# Patient Record
Sex: Male | Born: 1980 | Race: Black or African American | Hispanic: No | Marital: Single | Smoking: Current every day smoker
Health system: Southern US, Community
[De-identification: ages and names within clinical notes are randomized; demographics above are authoritative.]

## PROBLEM LIST (undated history)

## (undated) DIAGNOSIS — Z9109 Other allergy status, other than to drugs and biological substances: Secondary | ICD-10-CM

## (undated) DIAGNOSIS — K089 Disorder of teeth and supporting structures, unspecified: Secondary | ICD-10-CM

## (undated) HISTORY — PX: MOUTH SURGERY: SHX715

---

## 2001-01-30 ENCOUNTER — Encounter: Payer: Self-pay | Admitting: Emergency Medicine

## 2001-01-30 ENCOUNTER — Emergency Department (HOSPITAL_COMMUNITY): Admission: AC | Admit: 2001-01-30 | Discharge: 2001-01-30 | Payer: Self-pay

## 2001-08-20 ENCOUNTER — Emergency Department (HOSPITAL_COMMUNITY): Admission: EM | Admit: 2001-08-20 | Discharge: 2001-08-20 | Payer: Self-pay

## 2004-07-30 ENCOUNTER — Emergency Department (HOSPITAL_COMMUNITY): Admission: EM | Admit: 2004-07-30 | Discharge: 2004-07-30 | Payer: Self-pay | Admitting: Emergency Medicine

## 2005-05-14 ENCOUNTER — Emergency Department: Payer: Self-pay | Admitting: Emergency Medicine

## 2005-05-14 ENCOUNTER — Emergency Department (HOSPITAL_COMMUNITY): Admission: EM | Admit: 2005-05-14 | Discharge: 2005-05-14 | Payer: Self-pay | Admitting: Emergency Medicine

## 2005-05-24 ENCOUNTER — Emergency Department (HOSPITAL_COMMUNITY): Admission: EM | Admit: 2005-05-24 | Discharge: 2005-05-24 | Payer: Self-pay | Admitting: Emergency Medicine

## 2007-02-06 ENCOUNTER — Emergency Department (HOSPITAL_COMMUNITY): Admission: EM | Admit: 2007-02-06 | Discharge: 2007-02-06 | Payer: Self-pay | Admitting: Emergency Medicine

## 2007-02-09 ENCOUNTER — Emergency Department (HOSPITAL_COMMUNITY): Admission: EM | Admit: 2007-02-09 | Discharge: 2007-02-09 | Payer: Self-pay | Admitting: Family Medicine

## 2007-09-03 ENCOUNTER — Inpatient Hospital Stay (HOSPITAL_COMMUNITY): Admission: AC | Admit: 2007-09-03 | Discharge: 2007-09-11 | Payer: Self-pay

## 2007-09-24 ENCOUNTER — Emergency Department (HOSPITAL_COMMUNITY): Admission: EM | Admit: 2007-09-24 | Discharge: 2007-09-24 | Payer: Self-pay | Admitting: Emergency Medicine

## 2008-06-03 ENCOUNTER — Emergency Department (HOSPITAL_COMMUNITY): Admission: EM | Admit: 2008-06-03 | Discharge: 2008-06-03 | Payer: Self-pay | Admitting: Emergency Medicine
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
## 2008-09-15 ENCOUNTER — Emergency Department (HOSPITAL_COMMUNITY): Admission: EM | Admit: 2008-09-15 | Discharge: 2008-09-15 | Payer: Self-pay | Admitting: Emergency Medicine

## 2008-10-17 ENCOUNTER — Emergency Department (HOSPITAL_COMMUNITY): Admission: EM | Admit: 2008-10-17 | Discharge: 2008-10-17 | Payer: Self-pay | Admitting: Emergency Medicine

## 2008-10-27 ENCOUNTER — Emergency Department (HOSPITAL_COMMUNITY): Admission: EM | Admit: 2008-10-27 | Discharge: 2008-10-27 | Payer: Self-pay | Admitting: Emergency Medicine

## 2008-12-24 ENCOUNTER — Emergency Department (HOSPITAL_COMMUNITY): Admission: EM | Admit: 2008-12-24 | Discharge: 2008-12-24 | Payer: Self-pay | Admitting: Emergency Medicine

## 2009-01-28 ENCOUNTER — Emergency Department (HOSPITAL_COMMUNITY): Admission: EM | Admit: 2009-01-28 | Discharge: 2009-01-28 | Payer: Self-pay | Admitting: Emergency Medicine

## 2009-04-10 ENCOUNTER — Emergency Department (HOSPITAL_COMMUNITY): Admission: EM | Admit: 2009-04-10 | Discharge: 2009-04-10 | Payer: Self-pay | Admitting: Emergency Medicine

## 2009-07-15 ENCOUNTER — Emergency Department (HOSPITAL_COMMUNITY): Admission: EM | Admit: 2009-07-15 | Discharge: 2009-07-15 | Payer: Self-pay | Admitting: Emergency Medicine

## 2009-08-01 ENCOUNTER — Emergency Department (HOSPITAL_COMMUNITY): Admission: EM | Admit: 2009-08-01 | Discharge: 2009-08-01 | Payer: Self-pay | Admitting: Emergency Medicine

## 2009-09-19 ENCOUNTER — Emergency Department (HOSPITAL_COMMUNITY): Admission: EM | Admit: 2009-09-19 | Discharge: 2009-09-19 | Payer: Self-pay | Admitting: Emergency Medicine

## 2009-10-09 ENCOUNTER — Emergency Department (HOSPITAL_COMMUNITY): Admission: EM | Admit: 2009-10-09 | Discharge: 2009-10-09 | Payer: Self-pay | Admitting: Emergency Medicine

## 2009-11-18 ENCOUNTER — Emergency Department (HOSPITAL_COMMUNITY): Admission: EM | Admit: 2009-11-18 | Discharge: 2009-11-18 | Payer: Self-pay | Admitting: Emergency Medicine

## 2010-03-02 ENCOUNTER — Emergency Department (HOSPITAL_COMMUNITY): Admission: EM | Admit: 2010-03-02 | Discharge: 2010-03-02 | Payer: Self-pay | Admitting: Emergency Medicine

## 2010-05-07 ENCOUNTER — Emergency Department (HOSPITAL_COMMUNITY): Admission: EM | Admit: 2010-05-07 | Discharge: 2010-05-07 | Payer: Self-pay | Admitting: Emergency Medicine

## 2010-05-21 ENCOUNTER — Emergency Department (HOSPITAL_COMMUNITY): Admission: EM | Admit: 2010-05-21 | Discharge: 2010-05-21 | Payer: Self-pay | Admitting: Emergency Medicine

## 2010-07-21 ENCOUNTER — Emergency Department (HOSPITAL_COMMUNITY): Admission: EM | Admit: 2010-07-21 | Discharge: 2010-07-21 | Payer: Self-pay | Admitting: Emergency Medicine

## 2010-11-29 ENCOUNTER — Emergency Department (HOSPITAL_COMMUNITY)
Admission: EM | Admit: 2010-11-29 | Discharge: 2010-11-29 | Disposition: A | Discharge status: Home / Self Care | Payer: Self-pay | Attending: Emergency Medicine | Admitting: Emergency Medicine

## 2010-11-29 DIAGNOSIS — K029 Dental caries, unspecified: Secondary | ICD-10-CM | POA: Insufficient documentation

## 2010-11-29 DIAGNOSIS — K089 Disorder of teeth and supporting structures, unspecified: Secondary | ICD-10-CM | POA: Insufficient documentation

## 2010-12-25 LAB — GC/CHLAMYDIA PROBE AMP, GENITAL: GC Probe Amp, Genital: NEGATIVE
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
## 2011-03-24 ENCOUNTER — Emergency Department (HOSPITAL_COMMUNITY)
Admission: EM | Admit: 2011-03-24 | Discharge: 2011-03-24 | Disposition: A | Discharge status: Home / Self Care | Payer: Self-pay | Attending: Emergency Medicine | Admitting: Emergency Medicine

## 2011-03-24 DIAGNOSIS — K089 Disorder of teeth and supporting structures, unspecified: Secondary | ICD-10-CM | POA: Insufficient documentation

## 2011-06-20 ENCOUNTER — Emergency Department (HOSPITAL_COMMUNITY)
Admission: EM | Admit: 2011-06-20 | Discharge: 2011-06-20 | Disposition: A | Discharge status: Home / Self Care | Payer: Self-pay | Attending: Emergency Medicine | Admitting: Emergency Medicine

## 2011-06-20 DIAGNOSIS — L02419 Cutaneous abscess of limb, unspecified: Secondary | ICD-10-CM | POA: Insufficient documentation

## 2011-06-20 DIAGNOSIS — M79609 Pain in unspecified limb: Secondary | ICD-10-CM | POA: Insufficient documentation

## 2011-07-20 LAB — TYPE AND SCREEN: Antibody Screen: NEGATIVE

## 2011-07-20 LAB — CBC
HCT: 40.1
MCHC: 33
MCV: 89.8
Platelets: 260
Platelets: 274
RBC: 4.4
RBC: 4.48
WBC: 10.9 — ABNORMAL HIGH
WBC: 7.7

## 2011-07-20 LAB — I-STAT 8, (EC8 V) (CONVERTED LAB)
BUN: 11
HCT: 47
Potassium: 2.7 — CL
Sodium: 142
pCO2, Ven: 48.7

## 2011-07-20 LAB — BASIC METABOLIC PANEL
Calcium: 8 — ABNORMAL LOW
Chloride: 106
Creatinine, Ser: 0.99
GFR calc Af Amer: >60
GFR calc non Af Amer: >60

## 2011-07-20 LAB — ABO/RH: ABO/RH(D): O POS

## 2011-07-20 LAB — PROTIME-INR: INR: 1

## 2011-08-05 ENCOUNTER — Emergency Department (HOSPITAL_COMMUNITY)
Admission: EM | Admit: 2011-08-05 | Discharge: 2011-08-05 | Disposition: A | Discharge status: Home / Self Care | Payer: Self-pay | Attending: Emergency Medicine | Admitting: Emergency Medicine

## 2011-08-05 DIAGNOSIS — K089 Disorder of teeth and supporting structures, unspecified: Secondary | ICD-10-CM | POA: Insufficient documentation

## 2011-08-24 ENCOUNTER — Emergency Department (INDEPENDENT_AMBULATORY_CARE_PROVIDER_SITE_OTHER)
Admission: EM | Admit: 2011-08-24 | Discharge: 2011-08-24 | Disposition: A | Discharge status: Home / Self Care | Payer: Self-pay | Source: Home / Self Care | Attending: Emergency Medicine | Admitting: Emergency Medicine

## 2011-08-24 ENCOUNTER — Encounter: Payer: Self-pay | Admitting: Cardiology

## 2011-08-24 DIAGNOSIS — Z202 Contact with and (suspected) exposure to infections with a predominantly sexual mode of transmission: Secondary | ICD-10-CM

## 2011-08-24 NOTE — ED Provider Notes (Addendum)
History     CSN: [id number] Arrival date & time: 08/24/2011  1:18 PM   First MD Initiated Contact with Patient 08/24/11 1220      Chief Complaint  Patient presents with  . Penile Discharge    (Consider location/radiation/quality/duration/timing/severity/associated sxs/prior treatment) HPI Comments: I want to be tested about a week ago,im not sure but think saw some white discharge, have none now, no pain , no discharge and no burning on urination  Patient is a 30 y.o. male presenting with penile discharge. The history is provided by the patient.  Penile Discharge He has tried nothing for the symptoms.    History reviewed. No pertinent past medical history.  History reviewed. No pertinent past surgical history.  No family history on file.  History  Substance Use Topics  . Smoking status: Current Everyday Smoker -- 1.0 packs/day    Types: Cigarettes  . Smokeless tobacco: Not on file  . Alcohol Use: Yes     occas      Review of Systems  Constitutional: Negative.  Negative for fever.  Genitourinary: Negative for dysuria, discharge and penile pain.  Skin: Negative for rash.    Allergies  Review of patient's allergies indicates no known allergies.  Home Medications  No current outpatient prescriptions on file.  BP 138/85  Pulse 66  Temp(Src) 98.1 F (36.7 C) (Oral)  Resp 16  SpO2 100%  Physical Exam  Constitutional: He appears well-developed and well-nourished.  Abdominal: Soft.  Genitourinary: Penis normal. No penile erythema or penile tenderness. No discharge found.  Skin: No erythema.    ED Course  Procedures (including critical care time)   Labs Reviewed  GC/CHLAMYDIA PROBE AMP, GENITAL   No results found.   1. Sexually transmitted disease exposure       MDM  Worried well, non specific exposure but wanted to be screened        Jimmie Molly, MD 08/24/11 1857  Jimmie Molly, MD 08/24/11 1857  Jimmie Molly, MD 08/24/11 7829

## 2011-08-24 NOTE — ED Notes (Signed)
Pt reports clear discharge from penis for the past week. Denies burning/pain on urination. Denies exposure to STD.

## 2011-08-25 LAB — GC/CHLAMYDIA PROBE AMP, GENITAL
Chlamydia, DNA Probe: NEGATIVE
GC Probe Amp, Genital: NEGATIVE

## 2011-08-26 ENCOUNTER — Telehealth (HOSPITAL_COMMUNITY): Payer: Self-pay | Admitting: *Deleted

## 2011-11-24 ENCOUNTER — Encounter (HOSPITAL_COMMUNITY): Payer: Self-pay | Admitting: Emergency Medicine

## 2011-11-24 ENCOUNTER — Emergency Department (HOSPITAL_COMMUNITY)
Admission: EM | Admit: 2011-11-24 | Discharge: 2011-11-24 | Disposition: A | Discharge status: Home / Self Care | Payer: Self-pay | Attending: Emergency Medicine | Admitting: Emergency Medicine

## 2011-11-24 DIAGNOSIS — K089 Disorder of teeth and supporting structures, unspecified: Secondary | ICD-10-CM | POA: Insufficient documentation

## 2011-11-24 DIAGNOSIS — K0889 Other specified disorders of teeth and supporting structures: Secondary | ICD-10-CM

## 2011-11-24 DIAGNOSIS — R220 Localized swelling, mass and lump, head: Secondary | ICD-10-CM | POA: Insufficient documentation

## 2011-11-24 DIAGNOSIS — R6884 Jaw pain: Secondary | ICD-10-CM | POA: Insufficient documentation

## 2011-11-24 DIAGNOSIS — R221 Localized swelling, mass and lump, neck: Secondary | ICD-10-CM | POA: Insufficient documentation

## 2011-11-24 MED ORDER — HYDROCODONE-ACETAMINOPHEN 5-325 MG PO TABS
1.0000 | ORAL_TABLET | Freq: Once | ORAL | Status: DC
  Filled 2011-11-24: qty 1

## 2011-11-24 MED ORDER — HYDROCODONE-ACETAMINOPHEN 5-325 MG PO TABS: 1.0000 | ORAL_TABLET | Freq: Four times a day (QID) | ORAL | Status: AC | PRN

## 2011-11-24 MED ORDER — NAPROXEN 500 MG PO TABS: 500.0000 mg | ORAL_TABLET | Freq: Two times a day (BID) | ORAL | Status: DC

## 2011-11-24 MED ORDER — PENICILLIN V POTASSIUM 500 MG PO TABS: 500.0000 mg | ORAL_TABLET | Freq: Four times a day (QID) | ORAL | Status: AC

## 2011-11-24 NOTE — ED Notes (Signed)
Pt received to [facility] with toothache x 1 week, denies any fever., no facial swelling noted.

## 2011-11-24 NOTE — ED Notes (Signed)
Patient states he has pain in lower left jaw and has a pus pocket in it several days ago.  No infections noted at site. C/O pain that he has been taking Tylenol for with no relief.  [facility] is to [facility] him [facility] appointment.

## 2011-11-24 NOTE — Discharge Instructions (Signed)
Dental Pain Toothache is pain in or around a tooth. It may get worse with chewing or with cold or heat.  HOME CARE  Your dentist may use a numbing medicine during treatment. If so, you may need to avoid eating until the medicine wears off. Ask your dentist about this.   Only take medicine as told by your dentist or doctor.   Avoid chewing food near the painful tooth until after all treatment is done. Ask your dentist about this.  GET HELP RIGHT AWAY IF:   The problem gets worse or new problems appear.   You have a fever.   There is redness and puffiness (swelling) of the face, jaw, or neck.   You cannot open your mouth.   There is pain in the jaw.   There is very bad pain that is not helped by medicine.  MAKE SURE YOU:   Understand these instructions.   Will watch your condition.   Will get help right away if you are not doing well or get worse.  Document Released: 03/15/2008 Document Revised: 06/09/2011 Document Reviewed: 03/15/2008 [facility] Patient Information 2012 [place], [place].  RESOURCE GUIDE  Dental Problems  Patients with Medicaid: [facility] [phone] [address]                                           [phone] [address] Phone:  [phone]                                                  Phone:  [phone]  If unable to pay or uninsured, contact:  [facility] or [facility]. to become qualified for the adult dental clinic.  Chronic Pain Problems Contact [facility]  [phone] Patients need to be referred by their primary care doctor.  Insufficient Money for Medicine Contact United Way:  call "211" or [facility] [phone].  No Primary Care Doctor Call [facility]  [phone] Other agencies that provide inexpensive medical care    [facility]  [phone]    [facility]  [phone]    [facility]  [phone]    [facility]  [phone]    [facility]  [phone]    [facility]  [phone]  Psychological Services [facility] Behavioral Health  [phone] [facility] Services  [phone] [facility]   [phone] (emergency services [phone])  Substance Abuse Resources Alcohol and Drug Services  [phone] [facility] [phone] [facility] [place] [phone] Floydene Flock [phone] Residential & Outpatient Substance Abuse Program  ([phone]  Abuse/Neglect [facility] Child Abuse Hotline 609-184-5630 [facility] Child Abuse Hotline 936-028-6252 (After Hours)  Emergency Shelter [facility] Ministries (734) [phone]  Maternity Homes Room at the [place] [facility] [place] [phone] [facility] [phone]  MRSA Hotline #:   971-555-0236    [facility]  [address] [place] [address] [place]                       [address]      [address]  Blondell Reveal Phone:  [phone]                                   Phone:  [phone]                 Phone:  [phone]  [facility] Mental Health Phone:  [phone]  [facility] Child Abuse Hotline [phone])[phone] (After Hours)   Take antibiotic as directed take anti-inflammatory and pain medicine as needed. Followup with dentist as soon as possible resource guide provide.

## 2011-11-24 NOTE — ED Notes (Signed)
Left sided dental pain x 1 week had a bump in mouth and it busrt

## 2011-11-24 NOTE — ED Provider Notes (Signed)
History     CSN: [id number]  Arrival date & time 11/24/11  1326   First MD Initiated Contact with Patient 11/24/11 1432      Chief Complaint  Patient presents with  . Dental Pain    (Consider location/radiation/quality/duration/timing/severity/associated sxs/prior treatment) Patient is a 31 y.o. male presenting with tooth pain. The history is provided by the patient.  Dental PainThe primary symptoms include mouth pain and oral bleeding. Primary symptoms do not include dental injury, oral lesions, headaches, fever, shortness of breath, sore throat, angioedema or cough. The symptoms began more than 1 week ago. The symptoms are worsening. The symptoms occur constantly.  Additional symptoms include: gum swelling, gum tenderness, jaw pain and facial swelling. Additional symptoms do not include: trouble swallowing and drooling.    History reviewed. No pertinent past medical history.  History reviewed. No pertinent past surgical history.  No family history on file.  History  Substance Use Topics  . Smoking status: Current Everyday Smoker -- 1.0 packs/day    Types: Cigarettes  . Smokeless tobacco: Not on file  . Alcohol Use: Yes     occas      Review of Systems  Constitutional: Negative for fever.  HENT: Positive for facial swelling. Negative for sore throat, drooling and trouble swallowing.   Respiratory: Negative for cough and shortness of breath.   Neurological: Negative for headaches.    Allergies  Review of patient's allergies indicates no known allergies.  Home Medications   Current Outpatient Rx  Name Route Sig Dispense Refill  . ACETAMINOPHEN 500 MG PO TABS Oral Take 1,000 mg by mouth every 6 (six) hours as needed. As needed for pain.    Marland Kitchen HYDROCODONE-ACETAMINOPHEN 5-325 MG PO TABS Oral Take 1-2 tablets by mouth every 6 (six) hours as needed for pain. 10 tablet 0  . NAPROXEN 500 MG PO TABS Oral Take 1 tablet (500 mg total) by mouth 2 (two) times daily. 14 tablet 0   . PENICILLIN V POTASSIUM 500 MG PO TABS Oral Take 1 tablet (500 mg total) by mouth 4 (four) times daily. 40 tablet 0    BP 107/72  Pulse 79  Temp 98.2 F (36.8 C)  Resp 16  SpO2 99%  Physical Exam  Nursing note and vitals reviewed. Constitutional: He is oriented to person, place, and time. He appears well-developed and well-nourished. No distress.  HENT:  Head: Normocephalic and atraumatic.  Mouth/Throat: Oropharynx is clear and moist.       Missing several teeth no direct evidence of gum swelling or facial swelling no evidence of gum infection. Tender teeth that are remaining both sides lower jaw  Eyes: Pupils are equal, round, and reactive to light.  Neck: Normal range of motion. Neck supple.  Cardiovascular: Normal rate, regular rhythm and normal heart sounds.   No murmur heard. Pulmonary/Chest: Effort normal and breath sounds normal.  Abdominal: Soft. Bowel sounds are normal.  Musculoskeletal: Normal range of motion.  Lymphadenopathy:    He has no cervical adenopathy.  Neurological: He is alert and oriented to person, place, and time. No cranial nerve deficit. He exhibits normal muscle tone. Coordination normal.  Skin: Skin is warm. No rash noted.    ED Course  Procedures (including critical care time)  Labs Reviewed - No data to display No results found.   1. Toothache       MDM  Patient already missing numerous teeth complaint of tooth pain along both sides of the lower jaw no evidence of  significant gum infection swelling but suspect due to significant decay probably toothache related.    Resource guide provided for patient to find dental followup.        Shelda Jakes, MD 11/24/11 [phone]

## 2012-03-28 ENCOUNTER — Encounter (HOSPITAL_COMMUNITY): Payer: Self-pay

## 2012-03-28 ENCOUNTER — Emergency Department (HOSPITAL_COMMUNITY)
Admission: EM | Admit: 2012-03-28 | Discharge: 2012-03-28 | Disposition: A | Discharge status: Home / Self Care | Payer: Self-pay | Attending: Emergency Medicine | Admitting: Emergency Medicine

## 2012-03-28 DIAGNOSIS — B353 Tinea pedis: Secondary | ICD-10-CM | POA: Insufficient documentation

## 2012-03-28 DIAGNOSIS — K0889 Other specified disorders of teeth and supporting structures: Secondary | ICD-10-CM

## 2012-03-28 DIAGNOSIS — K029 Dental caries, unspecified: Secondary | ICD-10-CM | POA: Insufficient documentation

## 2012-03-28 DIAGNOSIS — L02619 Cutaneous abscess of unspecified foot: Secondary | ICD-10-CM | POA: Insufficient documentation

## 2012-03-28 DIAGNOSIS — L03039 Cellulitis of unspecified toe: Secondary | ICD-10-CM | POA: Insufficient documentation

## 2012-03-28 MED ORDER — IBUPROFEN 800 MG PO TABS: 800.0000 mg | ORAL_TABLET | Freq: Three times a day (TID) | ORAL | Status: AC | PRN

## 2012-03-28 MED ORDER — HYDROCODONE-ACETAMINOPHEN 5-325 MG PO TABS: ORAL_TABLET | ORAL | Status: AC

## 2012-03-28 MED ORDER — CEPHALEXIN 500 MG PO CAPS: 500.0000 mg | ORAL_CAPSULE | Freq: Four times a day (QID) | ORAL | Status: AC

## 2012-03-28 NOTE — ED Notes (Signed)
Pt presented to the ER with complaint of pain and swelling to the 5th toe of the lt foot onset Sunday night and toothache x 1 week.

## 2012-03-28 NOTE — Discharge Instructions (Signed)
Please read and follow all provided instructions.  Your diagnoses today include:  1. Cellulitis of toe   2. Pain, dental   3. Tinea pedis     Tests performed today include:  Vital signs. See below for your results today.   Medications prescribed:   Keflex - antibiotic that kills skin bacteria  You have been prescribed an antibiotic medicine: take the entire course of medicine even if you are feeling better. Stopping early can cause the antibiotic not to work.   Vicodin (hydrocodone/acetaminophen) - narcotic pain medicine  You have been prescribed narcotic pain medication such as Vicodin or Percocet: DO NOT drive or perform any activities that require you to be awake and alert because this medicine can make you drowsy. BE VERY CAREFUL not to take multiple medicines containing Tylenol (also called acetaminophen). Doing so can lead to an overdose which can damage your liver and cause liver failure and possibly death.     Ibuprofen - anti-inflammatory pain medication  Do not exceed 800mg  ibuprofen every 8 hours  You have been prescribed an anti-inflammatory medication or NSAID. You can also find these medications over the counter. Take with food. Take smallest effective dose for the shortest duration needed for your pain. Stop taking if you experience stomach pain or vomiting.   Take any prescribed medications only as directed.   Home care instructions:  Follow any educational materials contained in this packet. Keep affected area above the level of your heart when possible. Wash area gently twice a day with warm soapy water. Do not apply alcohol or hydrogen peroxide. Cover the area if it draining or weeping.   Buy over-the-counter medication such as Lamisil to treat your athlete's foot and prevent further infections.   Follow-up instructions: Return to the Emergency Department in 48 hours for a recheck if your symptoms are not significantly improved.   Please follow-up with your  primary care provider in the next 1 week for further evaluation of your symptoms. If you do not have a primary care doctor -- see below for referral information.   Return instructions:  Return to the Emergency Department if you have:  Fever  Worsening symptoms  Worsening pain  Worsening swelling  Neck swelling or trouble breathing  Redness of the skin that moves away from the affected area, especially if it streaks away from the affected area   Any other emergent concerns  Your vital signs today were: BP 118/75  Pulse 67  Temp 97.6 F (36.4 C) (Oral)  Resp 16  Ht 5\' 11"  (1.803 m)  Wt 179 lb (81.194 kg)  BMI 24.97 kg/m2  SpO2 100% If your blood pressure (BP) was elevated above 135/85 this visit, please have this repeated by your doctor within one month. -------------- No Primary Care Doctor Call [facility]  [phone] Other agencies that provide inexpensive medical care    [facility]  [phone]    [facility] Internal Medicine  [phone]    [facility]  [phone]    [facility]  [phone]    [facility]  [phone]    [facility]  [phone] -------------- RESOURCE GUIDE:  Dental Problems  Patients with Medicaid: [facility] [phone] [address]  [address] Phone:  [phone]                                                   Phone:  [phone]  If unable to pay or uninsured, contact:  [facility] or [facility]. to become qualified for the adult dental clinic.  Chronic Pain Problems Contact [facility]  [phone] Patients need to be referred by their primary care doctor.  Insufficient Money for Medicine Contact United Way:  call "211" or [facility] [phone].  Psychological Services [facility] Behavioral Health  [phone] [facility]  [phone] [facility] Mental Health    [phone] (emergency services [phone])  Substance Abuse Resources Alcohol and Drug Services  [phone] [facility] [phone] [facility] [place] [phone] Floydene Flock 3163171097 Residential & Outpatient Substance Abuse Program  [phone]  Abuse/Neglect [facility] Child Abuse Hotline (629)165-6795 [facility] Child Abuse Hotline (215) 230-4283 (After Hours)  Emergency Shelter [facility] Ministries [phone])497-2761  Maternity Homes Room at the [place] [facility] [place] [phone] [place] [facility] [phone]  [facility] Resources  [facility] [place]     United Way                          [facility] Dept. [address] [place]                       [address]      [address]  Blondell Reveal Phone:  [phone]                                   Phone:  [phone]                 Phone:  [phone]  [facility] Mental Health Phone:  [phone]  [facility] Child Abuse Hotline 214-335-8756 [phone] (After Hours)

## 2012-03-28 NOTE — ED Notes (Addendum)
Patient states Sunday night his 5th toe on left foot. States the pain is a pressure when walking. 5th toe appears red and swollen and warm to touch. Patient state she has tooth ache (lower left gum/teeth).

## 2012-03-28 NOTE — ED Provider Notes (Signed)
History     CSN: [id number]  Arrival date & time 03/28/12  4098   First MD Initiated Contact with Patient 03/28/12 0940      Chief Complaint  Patient presents with  . Foot Swelling  . Dental Pain    (Consider location/radiation/quality/duration/timing/severity/associated sxs/prior treatment) HPI Comments: Patient presents with a complaint of left fifth toe pain, redness, and swelling for the past 2 days. Patient denies injury. He does have history of athlete's foot. No fevers, nausea or vomiting. He is taken Tylenol for the pain however this is only provided mild relief. Patient also complains of left and right lower jaw pain for the past week. Pain is intermittent. He has not had any facial swelling. Cold air makes the symptoms worse. Nothing makes the symptoms better. He has not had any neck swelling or shortness of breath. No history of diabetes.  Patient is a 31 y.o. male presenting with tooth pain and toe pain. The history is provided by the patient.  Dental PainThe primary symptoms include mouth pain and headaches. Primary symptoms do not include dental injury, fever, shortness of breath or sore throat. The symptoms began 5 to 7 days ago. The symptoms are unchanged. The symptoms are new. The symptoms occur intermittently.  The headache is not associated with weakness.   Additional symptoms do not include: jaw pain, facial swelling, trouble swallowing and ear pain.   Toe Pain This is a new problem. The current episode started in the past 7 days. The problem occurs constantly. The problem has been gradually worsening. Associated symptoms include headaches. Pertinent negatives include no arthralgias, fever, joint swelling, nausea, neck pain, numbness, sore throat, vomiting or weakness. The symptoms are aggravated by bending and walking. He has tried acetaminophen for the symptoms. The treatment provided mild relief.    History reviewed. No pertinent past medical history.  History  reviewed. No pertinent past surgical history.  No family history on file.  History  Substance Use Topics  . Smoking status: Current Everyday Smoker -- 1.0 packs/day    Types: Cigarettes  . Smokeless tobacco: Not on file  . Alcohol Use: Yes     occas      Review of Systems  Constitutional: Negative for fever and activity change.  HENT: Positive for dental problem. Negative for ear pain, sore throat, facial swelling, trouble swallowing and neck pain.   Respiratory: Negative for shortness of breath and stridor.   Gastrointestinal: Negative for nausea and vomiting.  Musculoskeletal: Negative for joint swelling, arthralgias and gait problem.  Skin: Positive for color change. Negative for wound.  Neurological: Positive for headaches. Negative for weakness and numbness.    Allergies  Review of patient's allergies indicates no known allergies.  Home Medications   Current Outpatient Rx  Name Route Sig Dispense Refill  . ACETAMINOPHEN 325 MG PO TABS Oral Take 650 mg by mouth every 6 (six) hours as needed. For pain      BP 118/75  Pulse 67  Temp 97.6 F (36.4 C) (Oral)  Resp 16  Ht 5\' 11"  (1.803 m)  Wt 179 lb (81.194 kg)  BMI 24.97 kg/m2  SpO2 100%  Physical Exam  Nursing note and vitals reviewed. Constitutional: He appears well-developed and well-nourished.  HENT:  Head: Normocephalic and atraumatic. No trismus in the jaw.  Right Ear: Tympanic membrane, external ear and ear canal normal.  Left Ear: Tympanic membrane, external ear and ear canal normal.  Nose: Nose normal.  Mouth/Throat: Uvula is midline, oropharynx is  clear and moist and mucous membranes are normal. Abnormal dentition. Dental caries present. No dental abscesses or uvula swelling. No tonsillar abscesses.       Patient with bilateral mandibular tooth pain and tenderness to palpation in area of incisiors. No gross abscess. Gingivitis noted.   Eyes: Conjunctivae are normal. Pupils are equal, round, and  reactive to light.  Neck: Normal range of motion. Neck supple.       No neck swelling or Lugwig's angina  Cardiovascular: Normal pulses.   Musculoskeletal: He exhibits tenderness. He exhibits no edema.       L 5th toe redness proximal to DIP to MTP. There is no paronychia or fluctuance. There is fungal infection between toes with localized skin breakdown.   Neurological: He is alert. No sensory deficit.       Motor, sensation, and vascular distal to the injury is fully intact.   Skin: Skin is warm and dry.  Psychiatric: He has a normal mood and affect.    ED Course  Procedures (including critical care time)  Labs Reviewed - No data to display No results found.   1. Cellulitis of toe   2. Pain, dental   3. Tinea pedis     10:03 AM Patient seen and examined. Will treat for cellulitis of toe. No evidence of paronychia.  Vital signs reviewed and are as follows: Filed Vitals:   03/28/12 0849  BP: 118/75  Pulse: 67  Temp: 97.6 F (36.4 C)  Resp: 16   10:04 AM Pt urged to return with worsening pain, worsening swelling, expanding area of redness or streaking up extremity, fever, or any other concerns. Urged to take complete course of antibiotics as prescribed. Counseled to take pain medications as prescribed. Pt verbalizes understanding and agrees with plan.  10:04 AM Patient counseled to take prescribed medications as directed, return with worsening facial or neck swelling, and to follow-up with his dentist as soon as possible.   10:04 AM Patient counseled on use of narcotic pain medications. Counseled not to combine these medications with others containing tylenol. Urged not to drink alcohol, drive, or perform any other activities that requires focus while taking these medications. The patient verbalizes understanding and agrees with the plan.  MDM  L 5th toe cellulitis, no paronychia or felon. Likely 2/2 skin breakdown due to athlete's foot.   Patient with toothache.  No gross  abscess.  Exam unconcerning for Ludwig's angina or other deep tissue infection in neck.  Will treat with abx and pain medicine.  Urged patient to follow-up with dentist.           Renne Crigler, PA 03/28/12 1910

## 2012-03-29 NOTE — ED Provider Notes (Signed)
Medical screening examination/treatment/procedure(s) were performed by non-physician practitioner and as supervising physician I was immediately available for consultation/collaboration.  Cheri Guppy, MD 03/29/12 1537

## 2012-07-26 ENCOUNTER — Emergency Department (HOSPITAL_COMMUNITY)
Admission: EM | Admit: 2012-07-26 | Discharge: 2012-07-26 | Disposition: A | Discharge status: Home / Self Care | Payer: Self-pay | Attending: Emergency Medicine | Admitting: Emergency Medicine

## 2012-07-26 ENCOUNTER — Encounter (HOSPITAL_COMMUNITY): Payer: Self-pay | Admitting: *Deleted

## 2012-07-26 DIAGNOSIS — F17200 Nicotine dependence, unspecified, uncomplicated: Secondary | ICD-10-CM | POA: Insufficient documentation

## 2012-07-26 DIAGNOSIS — K0889 Other specified disorders of teeth and supporting structures: Secondary | ICD-10-CM

## 2012-07-26 DIAGNOSIS — K029 Dental caries, unspecified: Secondary | ICD-10-CM | POA: Insufficient documentation

## 2012-07-26 MED ORDER — IBUPROFEN 800 MG PO TABS: 800.0000 mg | ORAL_TABLET | Freq: Three times a day (TID) | ORAL | Status: DC

## 2012-07-26 NOTE — ED Provider Notes (Signed)
History     CSN: [id number]  Arrival date & time 07/26/12  0137   First MD Initiated Contact with Patient 07/26/12 0143      Chief Complaint  Patient presents with  . Dental Pain    (Consider location/radiation/quality/duration/timing/severity/associated sxs/prior treatment) HPI Comments: 31 year old male presents to the emergency department complaining of right-sided lower dental pain has been present for "a while" worsening over the past 4 days. States the pain is constant, rated 10 out of 10 unrelieved by Tylenol. Pain radiates up his jaw and is giving him a headache. Admits to dental problems and has no upper teeth. He says when he brushes his teeth his come in the area of pain begins to bleed. He thinks he may have a fever at night only. Denies difficulty swallowing, states it is painful to chew on that side. He does not have a dentist.  The history is provided by the patient.    History reviewed. No pertinent past medical history.  History reviewed. No pertinent past surgical history.  History reviewed. No pertinent family history.  History  Substance Use Topics  . Smoking status: Current Every Day Smoker -- 1.0 packs/day    Types: Cigarettes  . Smokeless tobacco: Not on file  . Alcohol Use: Yes     occas      Review of Systems  Constitutional: Positive for fever. Negative for chills.  HENT: Positive for facial swelling and dental problem. Negative for sore throat, mouth sores, trouble swallowing and voice change.   Gastrointestinal: Negative for nausea and vomiting.  Skin: Negative for rash.  Neurological: Positive for headaches.    Allergies  Review of patient's allergies indicates no known allergies.  Home Medications   Current Outpatient Rx  Name Route Sig Dispense Refill  . ACETAMINOPHEN 325 MG PO TABS Oral Take 650 mg by mouth every 6 (six) hours as needed. For pain      BP 139/71  Temp 98.3 F (36.8 C) (Oral)  Resp 18  SpO2 98%  Physical Exam   Constitutional: He is oriented to person, place, and time. He appears well-developed and well-nourished. No distress.  HENT:  Head: Normocephalic and atraumatic.  Mouth/Throat: Mucous membranes are normal. Abnormal dentition. Dental caries present.       Edentulous upper mouth. Multiple missing lower teeth. Present teeth all with dental caries. No edema, erythema, lesions, pus, dental abscess. Tenderness to palpation of right lower gums where 1st molar should be. No bleeding.  Eyes: Conjunctivae normal and EOM are normal.  Neck: Normal range of motion. Neck supple.  Cardiovascular: Normal rate, regular rhythm and normal heart sounds.   Pulmonary/Chest: Effort normal and breath sounds normal.  Musculoskeletal: Normal range of motion.  Lymphadenopathy:       Head (right side): No submental and no submandibular adenopathy present.       Head (left side): No submental and no submandibular adenopathy present.  Neurological: He is alert and oriented to person, place, and time.  Skin: Skin is warm and dry. No rash noted. He is not diaphoretic. No erythema.  Psychiatric: His speech is normal and behavior is normal. His affect is blunt.    ED Course  Procedures (including critical care time)  Labs Reviewed - No data to display No results found.   1. Pain, dental       MDM  31 year old male with dental pain without dental infection. I will give him ibuprofen and discussed followup with dentist within the next 48 hours.  Trevor Mace, PA-C 07/26/12 0222

## 2012-07-26 NOTE — ED Provider Notes (Signed)
Medical screening examination/treatment/procedure(s) were performed by non-physician practitioner and as supervising physician I was immediately available for consultation/collaboration.  Rikayla Demmon M Maebell Lyvers, MD 07/26/12 0513 

## 2012-07-26 NOTE — ED Notes (Signed)
Pt states that for the past 4 days his right lower mouth has been swollen and painful. Pt missing tooth in area that he says bleeds in the morning when he wakes up.

## 2013-02-06 ENCOUNTER — Encounter (HOSPITAL_COMMUNITY): Payer: Self-pay | Admitting: Emergency Medicine

## 2013-02-06 ENCOUNTER — Emergency Department (HOSPITAL_COMMUNITY)
Admission: EM | Admit: 2013-02-06 | Discharge: 2013-02-06 | Disposition: A | Discharge status: Home / Self Care | Payer: Self-pay | Attending: Emergency Medicine | Admitting: Emergency Medicine

## 2013-02-06 DIAGNOSIS — F17200 Nicotine dependence, unspecified, uncomplicated: Secondary | ICD-10-CM | POA: Insufficient documentation

## 2013-02-06 DIAGNOSIS — R51 Headache: Secondary | ICD-10-CM | POA: Insufficient documentation

## 2013-02-06 DIAGNOSIS — K0889 Other specified disorders of teeth and supporting structures: Secondary | ICD-10-CM

## 2013-02-06 DIAGNOSIS — R6884 Jaw pain: Secondary | ICD-10-CM | POA: Insufficient documentation

## 2013-02-06 DIAGNOSIS — K089 Disorder of teeth and supporting structures, unspecified: Secondary | ICD-10-CM | POA: Insufficient documentation

## 2013-02-06 MED ORDER — HYDROCODONE-ACETAMINOPHEN 5-325 MG PO TABS: 1.0000 | ORAL_TABLET | ORAL | Status: DC | PRN

## 2013-02-06 MED ORDER — PENICILLIN V POTASSIUM 500 MG PO TABS: 500.0000 mg | ORAL_TABLET | Freq: Three times a day (TID) | ORAL | Status: AC

## 2013-02-06 NOTE — ED Provider Notes (Signed)
History    This chart was scribed for Arthor Captain (PA) non-physician practitioner working with No att. providers found by Sofie Rower, ED Scribe. This patient was seen in room WTR8/WTR8 and the patient's care was started at 3:08PM.   CSN: [id number]  Arrival date & time 02/06/13  1319   None     Chief Complaint  Patient presents with  . Dental Pain    (Consider location/radiation/quality/duration/timing/severity/associated sxs/prior treatment) The history is provided by the patient. No language interpreter was used.    Howard Newman is a 32 y.o. male , with no known medical hx, who presents to the Emergency Department complaining of Intermittent , non radiating dental pain located at the left lower jaw, onset two days ago (02/04/13).  Associated symptoms include diffuse, non radiating, headache. The pt reports he has been experiencing an aching, painful sensation, within his left lower jaw and gum line for the past two days. The progressively worsening pain has prompted the pt's concern and desire to seek medical evaluation at [facility] this evening (02/06/13)  The pt denies difficulty breathing and difficulty swallowing.   The pt is a current everyday smoker, in addition to drinking alcohol occasionally.    History reviewed. No pertinent past medical history.  History reviewed. No pertinent past surgical history.  No family history on file.  History  Substance Use Topics  . Smoking status: Current Every Day Smoker -- 1.00 packs/day    Types: Cigarettes  . Smokeless tobacco: Not on file  . Alcohol Use: Yes     Comment: occas      Review of Systems  HENT: Positive for dental problem. Negative for trouble swallowing.   Respiratory: Negative for shortness of breath.   All other systems reviewed and are negative.        Allergies  Review of patient's allergies indicates no known allergies.  Home Medications   Current Outpatient Rx  Name  Route  Sig  Dispense   Refill  . acetaminophen (TYLENOL) 325 MG tablet   Oral   Take 650 mg by mouth every 6 (six) hours as needed. For pain         . HYDROcodone-acetaminophen (NORCO) 5-325 MG per tablet   Oral   Take 1-2 tablets by mouth every 4 (four) hours as needed for pain.   10 tablet   0   . penicillin v potassium (VEETID) 500 MG tablet   Oral   Take 1 tablet (500 mg total) by mouth 3 (three) times daily.   30 tablet   0     There were no vitals taken for this visit.  Physical Exam  Nursing note and vitals reviewed. Constitutional: He is oriented to person, place, and time. He appears well-developed and well-nourished. No distress.  HENT:  Head: Normocephalic and atraumatic.  Mouth/Throat: Uvula is midline and oropharynx is clear and moist. Abnormal dentition. No edematous.  Tender to palpitation along the gum line bilaterally. No lesions, No abscess' detected on exam. Poor dentition, Receding teeth. No erythema, no sweelling Airway patent.   Eyes: EOM are normal.  Neck: Neck supple. No tracheal deviation present.  Cardiovascular: Normal rate, regular rhythm and normal heart sounds.  Exam reveals no gallop and no friction rub.   No murmur heard. Pulmonary/Chest: Effort normal and breath sounds normal. No respiratory distress. He has no wheezes.  Abdominal: Soft. Bowel sounds are normal. He exhibits no distension. There is no tenderness.  Musculoskeletal: Normal range of motion.  Neurological:  He is alert and oriented to person, place, and time.  Skin: Skin is warm and dry.  Psychiatric: He has a normal mood and affect. His behavior is normal.    ED Course  Procedures (including critical care time)     COORDINATION OF CARE:  3:11 PM- Treatment plan discussed with patient. Pt agrees with treatment.      Labs Reviewed - No data to display No results found.   1. Pain, dental       MDM  3:32 PM  Filed Vitals:   02/06/13 1533  BP: 114/66  Pulse: 67  Temp: 98.5 F  (36.9 C)  Resp: 20     Patient with gum pain.  No gross abscess.  Exam unconcerning for Ludwig's angina or spread of infection.  Will treat with penicillin and pain medicine.  Urged patient to follow-up with dentist.     I personally performed the services described in this documentation, which was scribed in my presence. The recorded information has been reviewed and is accurate.     Arthor Captain, PA-C 02/06/13 1710

## 2013-02-06 NOTE — ED Notes (Signed)
Pt reports L lower toothache x 1 week.  Does not know if he has a broken tooth or a cavity.

## 2013-02-06 NOTE — ED Notes (Signed)
Bumps on his gums for the past few days now intermit pain getting worse today

## 2013-02-07 NOTE — ED Provider Notes (Signed)
Medical screening examination/treatment/procedure(s) were performed by non-physician practitioner and as supervising physician I was immediately available for consultation/collaboration.  Jailen Coward R. Esli Clements, MD 02/07/13 0016 

## 2013-08-30 ENCOUNTER — Encounter (HOSPITAL_COMMUNITY): Payer: Self-pay | Admitting: Emergency Medicine

## 2013-08-30 ENCOUNTER — Emergency Department (HOSPITAL_COMMUNITY)
Admission: EM | Admit: 2013-08-30 | Discharge: 2013-08-30 | Disposition: A | Discharge status: Home / Self Care | Payer: Self-pay | Attending: Emergency Medicine | Admitting: Emergency Medicine

## 2013-08-30 DIAGNOSIS — S301XXA Contusion of abdominal wall, initial encounter: Secondary | ICD-10-CM | POA: Insufficient documentation

## 2013-08-30 DIAGNOSIS — Y9367 Activity, basketball: Secondary | ICD-10-CM | POA: Insufficient documentation

## 2013-08-30 DIAGNOSIS — Y9239 Other specified sports and athletic area as the place of occurrence of the external cause: Secondary | ICD-10-CM | POA: Insufficient documentation

## 2013-08-30 DIAGNOSIS — W219XXA Striking against or struck by unspecified sports equipment, initial encounter: Secondary | ICD-10-CM | POA: Insufficient documentation

## 2013-08-30 DIAGNOSIS — F17200 Nicotine dependence, unspecified, uncomplicated: Secondary | ICD-10-CM | POA: Insufficient documentation

## 2013-08-30 DIAGNOSIS — K047 Periapical abscess without sinus: Secondary | ICD-10-CM | POA: Insufficient documentation

## 2013-08-30 MED ORDER — PENICILLIN V POTASSIUM 500 MG PO TABS: 500.0000 mg | ORAL_TABLET | Freq: Three times a day (TID) | ORAL | Status: DC

## 2013-08-30 MED ORDER — IBUPROFEN 800 MG PO TABS: 800.0000 mg | ORAL_TABLET | Freq: Three times a day (TID) | ORAL | Status: DC

## 2013-08-30 NOTE — ED Notes (Signed)
EPDA at bedside

## 2013-08-30 NOTE — ED Provider Notes (Signed)
CSN: [id number]     Arrival date & time 08/30/13  1350 History   First MD Initiated Contact with Patient 08/30/13 1403     Chief Complaint  Patient presents with  . Abdominal Pain   (Consider location/radiation/quality/duration/timing/severity/associated sxs/prior Treatment) Patient is a 32 y.o. male presenting with abdominal pain and tooth pain. The history is provided by the patient. No language interpreter was used.  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 Yesterday while playing basketball he was hit in the RLQ abdomen by another player's elbow. 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 No N, V. He has had normal, non-melanic BM's since the injury yesterday. No groin or testicular pain. No fever.  Dental Pain Location:  Lower Quality:  Aching Severity:  Mild Associated symptoms: no facial swelling and no fever   Associated symptoms comment:  Lower right incisor pain with gum swelling for 1-2 days. No facial swelling. No fever.    History reviewed. No pertinent past medical history. History reviewed. No pertinent past surgical history. History reviewed. No pertinent family history. History  Substance Use Topics  . Smoking status: Current Every Day Smoker -- 1.00 packs/day    Types: Cigarettes  . Smokeless tobacco: Not on file  . Alcohol Use: Yes     Comment: occas    Review of Systems  Constitutional: Negative for fever and chills.  HENT: Positive for dental problem. Negative for facial swelling.   Respiratory: Negative.  Negative for shortness of breath.    Cardiovascular: Negative.  Negative for chest pain.  Gastrointestinal: Positive for abdominal pain. Negative for nausea, vomiting and blood in stool.  Neurological: Negative.     Allergies  Review of patient's allergies indicates no known allergies.  Home Medications   Current Outpatient Rx  Name  Route  Sig  Dispense  Refill  . acetaminophen (TYLENOL) 325 MG tablet   Oral   Take 650 mg by mouth once. For pain          BP 138/77  Pulse 77  Temp(Src) 98.3 F (36.8 C)  Resp 18  SpO2 98% Physical Exam  Constitutional: He appears well-developed and well-nourished.  HENT:  Head: Normocephalic.  Predominantly edentulous. Small abscess to right lower jaw at absent lateral incisor.  Neck: Normal range of motion. Neck supple.  Cardiovascular: Normal rate and regular rhythm.   Pulmonary/Chest: Effort normal and breath sounds normal.  Abdominal: Soft. Bowel sounds are normal. He exhibits no mass. There is tenderness. There is no rebound and no guarding.  Mild tenderness in RLQ wtihout guarding or rebound.   Musculoskeletal: Normal range of motion.  Neurological: He is alert. No cranial nerve deficit.  Skin: Skin is warm and dry. No rash noted.  Psychiatric: He has a normal mood and affect.    ED Course  Procedures (including critical care time) Labs Review Labs Reviewed - No data to display Imaging Review No results found.  EKG Interpretation   None       MDM  No diagnosis found. 1. Abdominal contusion 2.  Dental abscess  He reports headache everyday for one year, resolved when taking ibuprofen. Offered reassurance. Abdomen is essentially benign - no pain when not applying pressure. He appears NAD, VSS. Do not suspect intra-abdominal injury. Return precautions given.   Will treat for dental abscess.     Arnoldo Hooker, PA-C 08/30/13 1456

## 2013-08-30 NOTE — ED Notes (Signed)
Per pt sts RLQ pain, HA and tooth pain. Denies N,V,D. Denies fever. sts was elbowed in the area of his abdomen that is hurting while playing ball.

## 2013-08-30 NOTE — ED Provider Notes (Signed)
Medical screening examination/treatment/procedure(s) were performed by non-physician practitioner and as supervising physician I was immediately available for consultation/collaboration.    Charnetta Wulff R Ein Rijo, MD 08/30/13 1603 

## 2014-02-12 ENCOUNTER — Emergency Department (HOSPITAL_COMMUNITY)
Admission: EM | Admit: 2014-02-12 | Discharge: 2014-02-12 | Disposition: A | Discharge status: Home / Self Care | Payer: Self-pay | Attending: Emergency Medicine | Admitting: Emergency Medicine

## 2014-02-12 ENCOUNTER — Encounter (HOSPITAL_COMMUNITY): Payer: Self-pay | Admitting: Emergency Medicine

## 2014-02-12 DIAGNOSIS — K089 Disorder of teeth and supporting structures, unspecified: Secondary | ICD-10-CM | POA: Insufficient documentation

## 2014-02-12 DIAGNOSIS — Z791 Long term (current) use of non-steroidal anti-inflammatories (NSAID): Secondary | ICD-10-CM | POA: Insufficient documentation

## 2014-02-12 DIAGNOSIS — K0889 Other specified disorders of teeth and supporting structures: Secondary | ICD-10-CM

## 2014-02-12 DIAGNOSIS — F17200 Nicotine dependence, unspecified, uncomplicated: Secondary | ICD-10-CM | POA: Insufficient documentation

## 2014-02-12 MED ORDER — PENICILLIN V POTASSIUM 500 MG PO TABS: 500.0000 mg | ORAL_TABLET | Freq: Four times a day (QID) | ORAL | Status: DC

## 2014-02-12 MED ORDER — OXYCODONE-ACETAMINOPHEN 5-325 MG PO TABS: 1.0000 | ORAL_TABLET | Freq: Four times a day (QID) | ORAL | Status: DC | PRN

## 2014-02-12 MED ORDER — PROMETHAZINE HCL 25 MG PO TABS: 25.0000 mg | ORAL_TABLET | Freq: Four times a day (QID) | ORAL | Status: DC | PRN

## 2014-02-12 NOTE — Progress Notes (Signed)
[facility] provided pt with a list of primary care resources and [facility] to help [facility] establish primary care. CL also provided pt with a list of dental resources.

## 2014-02-12 NOTE — ED Notes (Addendum)
Pt reports pain in l/lower mouth, sharp pain and swelling in gum. Pain x 6 days

## 2014-02-12 NOTE — Discharge Instructions (Signed)
Dental Pain °A tooth ache may be caused by cavities (tooth decay). Cavities expose the nerve of the tooth to air and hot or cold temperatures. It may come from an infection or abscess (also called a boil or furuncle) around your tooth. It is also often caused by dental caries (tooth decay). This causes the pain you are having. °DIAGNOSIS  °Your caregiver can diagnose this problem by exam. °TREATMENT  °· If caused by an infection, it may be treated with medications which kill germs (antibiotics) and pain medications as prescribed by your caregiver. Take medications as directed. °· Only take over-the-counter or prescription medicines for pain, discomfort, or fever as directed by your caregiver. °· Whether the tooth ache today is caused by infection or dental disease, you should see your dentist as soon as possible for further care. °SEEK MEDICAL CARE IF: °The exam and treatment you received today has been provided on an emergency basis only. This is not a substitute for complete medical or dental care. If your problem worsens or new problems (symptoms) appear, and you are unable to meet with your dentist, call or return to this location. °SEEK IMMEDIATE MEDICAL CARE IF:  °· You have a fever. °· You develop redness and swelling of your face, jaw, or neck. °· You are unable to open your mouth. °· You have severe pain uncontrolled by pain medicine. °MAKE SURE YOU:  °· Understand these instructions. °· Will watch your condition. °· Will get help right away if you are not doing well or get worse. °Document Released: 09/27/2005 Document Revised: 12/20/2011 Document Reviewed: 05/15/2008 °ExitCare® Patient Information ©2014 [employer]. °  Emergency Department Resource Guide °1) Find a Doctor and Pay Out of Pocket °Although you won't have to find out who is covered by your insurance plan, it is a good idea to ask around and get recommendations. You will then need to call the office and see if the doctor you have chosen will  accept you as a new patient and what types of options they offer for patients who are self-pay. Some doctors offer discounts or will set up payment plans for their patients who do not have insurance, but you will need to ask so you aren't surprised when you get to your appointment. ° °2) Contact Your Local Health Department °Not all health departments have doctors that can see patients for sick visits, but many do, so it is worth a call to see if yours does. If you don't know where your local health department is, you can check in your phone book. The CDC also has a tool to help you locate your state's health department, and many state websites also have listings of all of their local health departments. ° °3) Find a Walk-in Clinic °If your illness is not likely to be very severe or complicated, you may want to try a walk in clinic. These are popping up all over the country in pharmacies, drugstores, and shopping centers. They're usually staffed by nurse practitioners or physician assistants that have been trained to treat common illnesses and complaints. They're usually fairly quick and inexpensive. However, if you have serious medical issues or chronic medical problems, these are probably not your best option. ° °No Primary Care Doctor: °- Call [facility] at  832-8000 - they can help you locate a primary care doctor that  accepts your insurance, provides certain services, etc. °- Physician Referral Service- 1-800-533-3463 ° °Chronic Pain Problems: °Organization           Address  Phone   Notes  °[facility]  (336) 297-2271 Patients need to be referred by their primary care doctor.  ° °Medication Assistance: °Organization         Address  Phone   Notes  °[place] [facility] [address] °[place], [place] [address] (336) 641-8030 --Must be a resident of [place] °-- Must have NO insurance coverage whatsoever (no Medicaid/ Medicare, etc.) °-- The pt.  MUST have a primary care doctor that directs their care regularly and follows them in the community °  °[facility]  (866) 331-1348   °[employer]  (888) 892-1162   ° °Agencies that provide inexpensive medical care: °Organization         Address  Phone   Notes  °[facility]  (336) 832-8035   °[facility]    (336) 832-7272   °[facility] [address] °[place], [place] [address] (336) 832-4777   °[facility] [address], °[place] (336) 271-4999   °[facility]    (336) 373-0678   °[facility]    (336) 272-1050   °[facility] ° [address], [place] Phone:  (336) 832-4444, Fax:  (336) 832-4440 Hours of Operation:  9 am - 6 pm, M-F.  Also accepts Medicaid/Medicare and self-pay.  °[facility] ° [address], [place] Phone: (336) 832-3150, Fax: (336) 832-3151. Hours of Operation:  8:30 am - 5:30 pm, M-F.  Also accepts Medicaid and self-pay.  °[facility] [place] [address], [place] Phone: (336) 878-6027   °[facility] [address], [place], [place] (336)723-1848, Ext. 123 Mondays & Thursdays: 7-9 AM.  First 15 patients are seen on a first come, first serve basis. °  ° °Medicaid-accepting [place] Providers: ° °Organization         Address  Phone   Notes  °[facility] [address], [place] (336) 641-2100 Also accepts self-pay patients.  °[facility] [address], [place] ° (336) 856-9996   °[facility] [address], [place] (336) 288-8857   °[facility] [address] [place] [address], [place] (336) 299-7000   °Veita Bland [address], [place]  ° (336) 373-1557 Only accepts [place] Access Medicaid patients after they have their name applied to their card.  ° °Self-Pay (no insurance) in  [place]: ° °Organization         Address  Phone   Notes  °Sickle Cell Patients, [facility] [address], [place] (336) 832-1970   °[facility] [address], [place] (336) 832-4400   °[facility] ° [address], [place] (336) 992-4800   °[facility]/Dr. Osei-Bonsu ° [address] [place] [address], [place] or [address], [place] (336) 841-8500 Phone number for both [place] and [place] locations is the same.  °[facility] [address], [place] (336) 299-0000   °[facility] [address] [place] [address],  or [address] (336) 852-7530 °(336) 878-2260   °[facility] [address],  (336) 350-1642, phone; (336) 294-5005, fax Sees patients 1st and 3rd Saturday of every month.  Must not qualify   for public or private insurance (i.e. Medicaid, Medicare, [place] Health Choice, Veterans' Benefits) • Household income should be no more than 200% of the poverty level •The clinic cannot treat you if you are pregnant or think you are pregnant • Sexually transmitted diseases are not treated at the clinic.  ° °Dental Care: °Organization         Address  Phone  Notes  °[facility] [facility] [address], [place] (336) 641-6152 Accepts children up to age 21 who are enrolled in Medicaid or [place] Health Choice; pregnant women with a Medicaid card; and children who have applied for Medicaid or [place] Health Choice, but were declined, whose parents can pay a reduced fee at time of service.  °[facility] [place]  [address], [place] (336) 641-7733 Accepts children up to age 21 who are enrolled in Medicaid or [place] Health Choice; pregnant women with a Medicaid card; and children who have applied for Medicaid or [place] Health Choice, but were declined, whose parents  can pay a reduced fee at time of service.  °[facility] ° [address], [place] (336) 641-4533 Patients are seen by appointment only. Walk-ins are not accepted. [facility] will see patients 18 years of age and older. °Monday - Tuesday (8am-5pm) °Most Wednesdays (8:30-5pm) °$30 per visit, cash only  °[facility] ° [address], [place] (336) 641-4533 Patients are seen by appointment only. Walk-ins are not accepted. [facility] will see patients 18 years of age and older. °One Wednesday Evening (Monthly: Volunteer Based).  $30 per visit, cash only  °[facility]  (919) 537-3737 for adults; Children under age 4, call Graduate Pediatric Dentistry at (919) [phone]. Children aged 4-14, please call (919) 537-3737 to request a pediatric application. ° Dental services are provided in all areas of dental care including fillings, crowns and bridges, complete and partial dentures, implants, gum treatment, root canals, and extractions. Preventive care is also provided. Treatment is provided to both adults and children. °Patients are selected via a lottery and there is often a waiting list. °  °[facility] [address], °[place] ° (336) 763-8833 [email or web address] °  °[facility] [address], [place], [place] (336)723-1848, Ext. 123 Second and Fourth Thursday of each month, opens at 6:30 AM; Clinic ends at 9 AM.  Patients are seen on a first-come first-served basis, and a limited number are seen during each clinic.  ° °[facility] ° [address], [place], [place] (336) 723-7904   Eligibility Requirements °You must have lived in [place], [place], or [place] counties for at least the last three months. °  You cannot be eligible for state or federal sponsored healthcare insurance, including Veterans Administration, Medicaid, or Medicare. °  You generally cannot be eligible for healthcare  insurance through your employer.  °  How to apply: °Eligibility screenings are held every Tuesday and Wednesday afternoon from 1:00 pm until 4:00 pm. You do not need an appointment for the interview!  °[facility] [address], [place], [place] 336-631-2330   °[facility] [facility]  336-342-8273   °[facility]  336-703-3100   °[facility]  336-570-6415   ° °Behavioral Health Resources in the Community: °Intensive Outpatient Programs °Organization         Address  Phone    Notes  °[facility] [address], [place], [place] 336-878-6098   °[facility] [address], [place], [place] 336-832-9800   °[facility] [address], [place], [place] ° 336-882-2125   °[facility] [address],  °[place], [place] 1-800-853-5163 or 336-641-4981   °Substance Abuse Resources °Organization         Address  Phone  Notes  °[facility]  336-882-2125   °[facility]  336-784-9470   °[facility]  336-285-9073   °[facility]  336-845-3988   °Residential & Outpatient Substance Abuse Program  1-800-659-3381   °Psychological Services °Organization         Address  Phone  Notes  °[facility]  336- 832-9600   °[facility]  336- 378-7881   °[facility] [address], [place] 1-800-853-5163 or 336-641-4981   ° °Mobile Crisis Teams °Organization         Address  Phone  Notes  °[facility], Mobile Crisis Care Unit  1-877-626-1772   °Assertive °[facility] ° [address] [place], [place] 336-834-9664   °Sharon DeEsch [address] °[place] 336-554-5454   ° °Self-Help/Support Groups °Organization         Address  Phone             Notes  °[facility] [place] - variety of support groups  336- 373-1402 Call for more information  °Narcotics Anonymous (NA),  [facility] [address], °[place]  2 meetings at this location  ° °Residential Treatment Programs °Organization         Address  Phone  Notes  °[facility] [address],    °[place]  1-866-801-8205   °[facility] ° [address], [place], [place] 704-293-8524   °[facility] [address], [place] 336-845-3988 Admissions: 8am-3pm M-F  °[facility] [address]    °[place], [place] 336-841-1104   °[facility] [address], [place], [place] 336-379-7146   °[facility] [address]  °[place], [place] 336-285-9073   °[facility] Intensive Outpatient [address], [place], [place] 336-852-3033   °[facility] [address]  °[place], [place] 1-877-615-2722 or 336-784-9470   °[facility] [address] [place], [place] 336-227-7417 Accepts Medicaid  °[facility] [address]  °[place] 1-800-659-3381 Substance Abuse/Addiction Treatment  ° °[place] Behavioral Health Resources °Organization         Address  Phone  Notes  °[facility]  (888) 581-9988   °Julie Brannon, PhD [address] [place], [place]   (336) 349-5553 or (336) 951-0000   ° [facility]al   [address] °[place], [place] (336) 349-4454   °[facility] [address], [place], [place] (336) 342-8316 Insurance/Medicaid/sponsorship through [facility]  °[facility] [address]                                    [place], [place] (336) 342-8316 Therapy/tele-psych/case  °[facility] [address]  ° [place],  (336) 349-2233    °Dr. Arfeen  (336) 349-4544   °[facility] [place]  [employer] [facility] [place]   [facility] 1) [address], [place] °2) [address], [place] °3)  [address], [place] (336) 349-3220 °(336) 342-7768 ° °(336) 342-8140     °[place] [facility] (336) 342-1394 or (336) 342-3537 (After Hours)    ° °   °

## 2014-02-12 NOTE — ED Provider Notes (Signed)
CSN: [id number]     Arrival date & time 02/12/14  1335 History  This chart was scribed for non-physician practitioner, Junious SilkHannah Mintie Witherington, PA-C working with Ethelda ChickMartha K Linker, MD by Greggory StallionKayla Andersen, ED scribe. This patient was seen in room WTR8/WTR8 and the patient's care was started at 2:53 PM.   Chief Complaint  Patient presents with  . Oral Swelling  . Dental Pain    x 6 days   The history is provided by the patient. No language interpreter was used.   HPI Comments: Howard Newman is a 33 y.o. male who presents to the Emergency Department complaining of gradual onset, worsening, throbbing, sharp left lower dental pain that started 6 days ago. Hot and cold worsen the pain. Pt has taken tylenol with no relief. Denies fever, chills, trouble swallowing, difficulty breathing.   History reviewed. No pertinent past medical history. History reviewed. No pertinent past surgical history. History reviewed. No pertinent family history. History  Substance Use Topics  . Smoking status: Current Every Day Smoker -- 1.00 packs/day    Types: Cigarettes  . Smokeless tobacco: Not on file  . Alcohol Use: Yes     Comment: occas    Review of Systems  Constitutional: Negative for fever and chills.  HENT: Positive for dental problem. Negative for trouble swallowing.   All other systems reviewed and are negative.  Allergies  Review of patient's allergies indicates no known allergies.  Home Medications   Prior to Admission medications   Medication Sig Start Date End Date Taking? Authorizing Provider  acetaminophen (TYLENOL) 325 MG tablet Take 650 mg by mouth once. For pain    Historical Provider, MD  ibuprofen (ADVIL,MOTRIN) 800 MG tablet Take 1 tablet (800 mg total) by mouth 3 (three) times daily. 08/30/13   Shari A Upstill, PA-C  penicillin v potassium (VEETID) 500 MG tablet Take 1 tablet (500 mg total) by mouth 3 (three) times daily. 08/30/13   Shari A Upstill, PA-C   BP 119/72  Pulse 70  Temp(Src)  98.7 F (37.1 C) (Oral)  Resp 18  Wt 179 lb (81.194 kg)  SpO2 99%  Physical Exam  Nursing note and vitals reviewed. Constitutional: He is oriented to person, place, and time. He appears well-developed and well-nourished. No distress.  HENT:  Head: Normocephalic and atraumatic.  Right Ear: External ear normal.  Left Ear: External ear normal.  Nose: Nose normal.  No trismus or submental edema. No tongue elevation. Generally poor dentition. No drainable abscess.   Eyes: Conjunctivae are normal.  Neck: Normal range of motion. No tracheal deviation present.  Cardiovascular: Normal rate, regular rhythm and normal heart sounds.   Pulmonary/Chest: Effort normal and breath sounds normal. No stridor.  Abdominal: Soft. He exhibits no distension. There is no tenderness.  Musculoskeletal: Normal range of motion.  Neurological: He is alert and oriented to person, place, and time.  Skin: Skin is warm and dry. He is not diaphoretic.  Psychiatric: He has a normal mood and affect. His behavior is normal.    ED Course  Procedures (including critical care time)  DIAGNOSTIC STUDIES: Oxygen Saturation is 99% on RA, normal by my interpretation.    COORDINATION OF CARE: 2:56 PM-Discussed treatment plan which includes an antibiotic and pain medication with pt at bedside and pt agreed to plan. Will give pt dental referrals and advised him to follow up.   Labs Review Labs Reviewed - No data to display  Imaging Review No results found.   EKG Interpretation  None      MDM   Final diagnoses:  Pain, dental    Patient with toothache.  No gross abscess.  Exam unconcerning for Ludwig's angina or spread of infection.  Will treat with penicillin and pain medicine.  Urged patient to follow-up with dentist.    I personally performed the services described in this documentation, which was scribed in my presence. The recorded information has been reviewed and is accurate.  Mora BellmanHannah S Elner Seifert,  PA-C 02/17/14 [phone]

## 2014-02-17 NOTE — ED Provider Notes (Signed)
Medical screening examination/treatment/procedure(s) were performed by non-physician practitioner and as supervising physician I was immediately available for consultation/collaboration.   EKG Interpretation None       Martha K Linker, MD 02/17/14 0702 

## 2014-04-21 ENCOUNTER — Encounter (HOSPITAL_COMMUNITY): Payer: Self-pay | Admitting: Emergency Medicine

## 2014-04-21 ENCOUNTER — Emergency Department (HOSPITAL_COMMUNITY)
Admission: EM | Admit: 2014-04-21 | Discharge: 2014-04-21 | Disposition: A | Discharge status: Home / Self Care | Payer: Self-pay | Attending: Emergency Medicine | Admitting: Emergency Medicine

## 2014-04-21 DIAGNOSIS — W57XXXA Bitten or stung by nonvenomous insect and other nonvenomous arthropods, initial encounter: Principal | ICD-10-CM | POA: Insufficient documentation

## 2014-04-21 DIAGNOSIS — Y9289 Other specified places as the place of occurrence of the external cause: Secondary | ICD-10-CM | POA: Insufficient documentation

## 2014-04-21 DIAGNOSIS — Y9389 Activity, other specified: Secondary | ICD-10-CM | POA: Insufficient documentation

## 2014-04-21 DIAGNOSIS — S80862A Insect bite (nonvenomous), left lower leg, initial encounter: Secondary | ICD-10-CM

## 2014-04-21 DIAGNOSIS — Z792 Long term (current) use of antibiotics: Secondary | ICD-10-CM | POA: Insufficient documentation

## 2014-04-21 DIAGNOSIS — Z791 Long term (current) use of non-steroidal anti-inflammatories (NSAID): Secondary | ICD-10-CM | POA: Insufficient documentation

## 2014-04-21 DIAGNOSIS — S30860A Insect bite (nonvenomous) of lower back and pelvis, initial encounter: Secondary | ICD-10-CM | POA: Insufficient documentation

## 2014-04-21 DIAGNOSIS — S90569A Insect bite (nonvenomous), unspecified ankle, initial encounter: Secondary | ICD-10-CM | POA: Insufficient documentation

## 2014-04-21 DIAGNOSIS — Z79899 Other long term (current) drug therapy: Secondary | ICD-10-CM | POA: Insufficient documentation

## 2014-04-21 DIAGNOSIS — F17200 Nicotine dependence, unspecified, uncomplicated: Secondary | ICD-10-CM | POA: Insufficient documentation

## 2014-04-21 MED ORDER — HYDROCORTISONE 1 % EX CREA: TOPICAL_CREAM | CUTANEOUS | Status: DC

## 2014-04-21 MED ORDER — DIPHENHYDRAMINE HCL 25 MG PO CAPS
25.0000 mg | ORAL_CAPSULE | Freq: Once | ORAL | Status: AC
  Administered 2014-04-21: 25 mg via ORAL
  Filled 2014-04-21: qty 1

## 2014-04-21 NOTE — ED Notes (Signed)
Pt states he was stung by a bee three times on Friday. Once to the back, no sting bite noticed, once to left thigh, red swollen area noticed, and once to back of rt knee.

## 2014-04-21 NOTE — ED Provider Notes (Signed)
CSN: [id number]     Arrival date & time 04/21/14  1907 History  This chart was scribed for non-physician provider Terri Piedraourtney Forcucci, PA-C, working with Juliet RudeNathan R. Rubin PayorPickering, MD by Phillis HaggisGabriella Gaje, ED Scribe. This patient was seen in room TR08C/TR08C and patient care was started at 7:37 PM.     Chief Complaint  Patient presents with  . Insect Bite   The history is provided by the patient. No language interpreter was used.   HPI Comments: Howard Newman is a 33 y.o. male who presents to the Emergency Department complaining of 3 wasp stings onset earlier today. 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 He denies purulent discharge from the bites, just swelling and erythema of the two bites on his leg. He states that the pain is worsened when he itches the bites. He denies trying anything for the pain. He states that he does not know if he allergic to wasp/bee venom. He denies swelling of face, lips and mouth, SOB, or chest pain.   History reviewed. No pertinent past medical history. History reviewed. No pertinent past surgical history. History reviewed. No pertinent family history.  History  Substance Use Topics  . Smoking status: Current Every Day Smoker -- 1.00 packs/day    Types: Cigarettes  . Smokeless tobacco: Not on file  . Alcohol Use: Yes     Comment: occas    Review of Systems  Respiratory: Negative for shortness of breath.   Cardiovascular: Negative for chest pain.  Skin: Positive for rash.  All other systems reviewed and are negative.  Allergies  Review of patient's allergies indicates no known allergies.  Home Medications   Prior to Admission medications   Medication Sig Start Date End Date Taking? Authorizing Provider  acetaminophen (TYLENOL) 325 MG tablet Take 650 mg by mouth once. For pain    Historical Provider, MD  hydrocortisone cream 1 % Apply to affected area 2 times daily 04/21/14   Taquila Leys A  Forcucci, PA-C  ibuprofen (ADVIL,MOTRIN) 800 MG tablet Take 1 tablet (800 mg total) by mouth 3 (three) times daily. 08/30/13   Shari A Upstill, PA-C  oxyCODONE-acetaminophen (PERCOCET/ROXICET) 5-325 MG per tablet Take 1-2 tablets by mouth every 6 (six) hours as needed for severe pain. 02/12/14   Mora BellmanHannah S Merrell, PA-C  penicillin v potassium (VEETID) 500 MG tablet Take 1 tablet (500 mg total) by mouth 3 (three) times daily. 08/30/13   Shari A Upstill, PA-C  penicillin v potassium (VEETID) 500 MG tablet Take 1 tablet (500 mg total) by mouth 4 (four) times daily. 02/12/14   Mora BellmanHannah S Merrell, PA-C  promethazine (PHENERGAN) 25 MG tablet Take 1 tablet (25 mg total) by mouth every 6 (six) hours as needed for nausea or vomiting. 02/12/14   Mora BellmanHannah S Merrell, PA-C   BP 110/65  Pulse 80  Temp(Src) 98.2 F (36.8 C) (Oral)  Ht 5\' 11"  (1.803 m)  Wt 167 lb 4.8 oz (75.887 kg)  BMI 23.34 kg/m2  SpO2 97% Physical Exam  Nursing note and vitals reviewed. Constitutional: He is oriented to person, place, and time. He appears well-developed and well-nourished. No distress.  HENT:  Head: Normocephalic and atraumatic.  Mouth/Throat: Oropharynx is clear and moist. No oropharyngeal exudate.  Eyes: Conjunctivae are normal. No scleral icterus.  Neck: Normal range of motion. Neck supple. No JVD present. No thyromegaly present.  Cardiovascular: Normal rate, regular rhythm,  normal heart sounds and intact distal pulses.  Exam reveals no gallop and no friction rub.   No murmur heard. Pulmonary/Chest: Effort normal and breath sounds normal. No respiratory distress. He has no wheezes. He has no rales. He exhibits no tenderness.  Lymphadenopathy:    He has no cervical adenopathy.  Neurological: He is alert and oriented to person, place, and time.  Skin: Skin is warm and dry. He is not diaphoretic.  Two insect bites on the left lateral quadricep without drainage, warmth or surrounding erythema.  Psychiatric: He has a normal  mood and affect. His behavior is normal. Judgment and thought content normal.    ED Course  Procedures (including critical care time) DIAGNOSTIC STUDIES: Oxygen Saturation is 97% on room air, normal by my interpretation.    COORDINATION OF CARE: 7:41 PM-Discussed treatment plan which includes benadryl and hydrocortisone cream with pt at bedside and pt agreed to plan.   Labs Review Labs Reviewed - No data to display  Imaging Review No results found.   EKG Interpretation None      MDM   Final diagnoses:  Insect bite of left leg, initial encounter   Patient presents to [facility] ED with wasp bites to the back and left thigh.  Physical exam is consistent with a local reaction to an insect bite.  I have given the patient benedryl here in the ED and will provide the patient with a prescription for hydrocortisone cream which he can take at home.  Patient states understanding at this time.  He was told to return for anaphylactic reaction symptoms.  He is stable for discharge at this time.    I personally performed the services described in this documentation, which was scribed in my presence. The recorded information has been reviewed and is accurate.    Eben Burow, PA-C 04/22/14 [phone]

## 2014-04-21 NOTE — ED Notes (Signed)
Patient here with complaint of 3 wasp stings. 2 on left leg and one on right lower back.

## 2014-04-21 NOTE — Discharge Instructions (Signed)

## 2014-04-23 NOTE — ED Provider Notes (Signed)
Medical screening examination/treatment/procedure(s) were performed by non-physician practitioner and as supervising physician I was immediately available for consultation/collaboration.   EKG Interpretation None       Cassius Cullinane R. Mak Bonny, MD 04/23/14 0702 

## 2014-05-28 ENCOUNTER — Encounter (HOSPITAL_COMMUNITY): Payer: Self-pay | Admitting: Emergency Medicine

## 2014-05-28 ENCOUNTER — Emergency Department (HOSPITAL_COMMUNITY)
Admission: EM | Admit: 2014-05-28 | Discharge: 2014-05-28 | Disposition: A | Discharge status: Home / Self Care | Payer: Self-pay | Attending: Emergency Medicine | Admitting: Emergency Medicine

## 2014-05-28 DIAGNOSIS — L0231 Cutaneous abscess of buttock: Secondary | ICD-10-CM | POA: Insufficient documentation

## 2014-05-28 DIAGNOSIS — F17200 Nicotine dependence, unspecified, uncomplicated: Secondary | ICD-10-CM | POA: Insufficient documentation

## 2014-05-28 DIAGNOSIS — L0291 Cutaneous abscess, unspecified: Secondary | ICD-10-CM

## 2014-05-28 DIAGNOSIS — L03317 Cellulitis of buttock: Principal | ICD-10-CM

## 2014-05-28 MED ORDER — CLINDAMYCIN HCL 300 MG PO CAPS: 300.0000 mg | ORAL_CAPSULE | Freq: Four times a day (QID) | ORAL | Status: AC

## 2014-05-28 MED ORDER — LIDOCAINE HCL (PF) 1 % IJ SOLN: 5.0000 mL | Freq: Once | INTRAMUSCULAR | Status: DC

## 2014-05-28 NOTE — Discharge Instructions (Signed)

## 2014-05-28 NOTE — ED Provider Notes (Signed)
CSN: [id number]     Arrival date & time 05/28/14  [id number] History   First MD Initiated Contact with Patient 05/28/14 [phone]     Chief Complaint  Patient presents with  . Abscess   Arline Aspicholas D Cavey is a 33 yo AAM w/PMH who presents w/ several abscesses within the past 2-3 weeks. Patient has 2 abscesses on his right but cheek. He's tried dial soap and warm compresses with mild relief. 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 Has never had this problem in the past.   Patient also admits that he's got poor dentition with recurrent abscesses.  Was wondering if he can get some antibiotics for this.  He denies CP, SOB, fever, chills, N/V, diarrhea, constipation, hematemesis, dysuria, hematuria, sick contacts, or recent travel.   (Consider location/radiation/quality/duration/timing/severity/associated sxs/prior Treatment) Patient is a 33 y.o. male presenting with abscess.  Abscess Location:  Ano-genital Ano-genital abscess location:  Gluteal cleft and R buttock Abscess quality: induration, itching, painful, redness and warmth   Abscess quality: not draining, no fluctuance and not weeping   Red streaking: no   Duration:  1 week Progression:  Worsening Chronicity:  New Context: not diabetes, not immunosuppression, not injected drug use, not insect bite/sting and not skin injury   Relieved by:  Nothing Worsened by:  Nothing tried Ineffective treatments:  Warm compresses Associated symptoms: no fever, no headaches, no nausea and no vomiting   Risk factors: no family hx of MRSA, no hx of MRSA and no prior abscess     History reviewed. No pertinent past medical history. History reviewed. No pertinent past surgical history. History reviewed. No pertinent family history. History  Substance Use Topics  . Smoking status: Current Every Day Smoker -- 1.00 packs/day    Types: Cigarettes  . Smokeless tobacco: Not on file  . Alcohol Use: Yes     Comment: occas    Review of  Systems  Constitutional: Negative for fever and chills.  Respiratory: Negative for shortness of breath.   Cardiovascular: Negative for chest pain, palpitations and leg swelling.  Gastrointestinal: Negative for nausea, vomiting, abdominal pain, diarrhea, constipation and abdominal distention.  Genitourinary: Negative for dysuria, frequency, flank pain and decreased urine volume.  Neurological: Negative for dizziness, speech difficulty, light-headedness and headaches.  All other systems reviewed and are negative.     Allergies  Review of patient's allergies indicates no known allergies.  Home Medications   Prior to Admission medications   Medication Sig Start Date End Date Taking? Authorizing Provider  ibuprofen (ADVIL,MOTRIN) 200 MG tablet Take 400 mg by mouth every 6 (six) hours as needed (pain).   Yes Historical Provider, MD  clindamycin (CLEOCIN) 300 MG capsule Take 1 capsule (300 mg total) by mouth every 6 (six) hours. 05/28/14 06/03/14  Rachelle HoraKeri Lurlene Ronda, MD   BP 112/71  Pulse 82  Temp(Src) 98.6 F (37 C) (Oral)  Resp 18  Ht 5\' 11"  (1.803 m)  Wt 170 lb (77.111 kg)  BMI 23.72 kg/m2  SpO2 100% Physical Exam  Nursing note and vitals reviewed. Constitutional: He is oriented to person, place, and time. He appears well-developed and well-nourished. No distress.  HENT:  Head: Normocephalic and atraumatic.  Mouth/Throat: No oropharyngeal exudate.  No sign of dental abscess.  Eyes: Pupils are equal, round, and reactive to light.  Neck: Normal range of motion.  Cardiovascular: Normal rate, regular rhythm, normal heart sounds and intact distal pulses.  Exam reveals no gallop and no friction  rub.   No murmur heard. Pulmonary/Chest: Effort normal and breath sounds normal. No respiratory distress. He has no wheezes. He has no rales. He exhibits no tenderness.  Abdominal: Soft. Bowel sounds are normal. He exhibits no distension and no mass. There is no tenderness. There is no rebound and no  guarding.  Musculoskeletal: Normal range of motion. He exhibits tenderness (right abscess (buttox)).       Legs: Lymphadenopathy:    He has no cervical adenopathy.  Neurological: He is alert and oriented to person, place, and time.  Skin: Skin is warm and dry. He is not diaphoretic.    ED Course  INCISION AND DRAINAGE Date/Time: 05/28/2014 11:05 AM Performed by: Rachelle Hora Authorized by: Rachelle Hora Consent: Verbal consent obtained. Patient identity confirmed: verbally with patient Type: abscess Body area: anogenital (right buttox) Anesthesia: local infiltration Local anesthetic: lidocaine 1% without epinephrine Anesthetic total: 5 ml Scalpel size: 11 Incision type: single straight Complexity: simple Drainage: serosanguinous Drainage amount: scant Wound treatment: wound left open Packing material: 1/4 in iodoform gauze Patient tolerance: Patient tolerated the procedure well with no immediate complications.   (including critical care time) Labs Review Labs Reviewed - No data to display  Imaging Review No results found.   EKG Interpretation None      MDM   33 yo AAM w/abscesses. Please see HPI for details. On exam, pt in NAD, AFVSS. Patient has early abscess formation on the right but talks. Measures approximately 3 cm in diameter. Positive induration no obvious fluctuation. Bedside ultrasound reveals early abscess formation with cobblestoning and scant amount of fluid collection. Second abscess located on the inner gluteal fold on the right side. This measures approximately half a centimeter in diameter. Ultrasound shows no fluid collection here. At this time will perform I&D of the first abscess.  See procedure note above. Patient tolerated this well. Packing inserted and bandaged. Was given Rx for clindamycin and discharged home. Patient to followup with PCP in a week to ensure appropriate healing. Strict return precautions include worsening swelling, redness, warmth,  pain, fevers/chills, or red streaking.  Final diagnoses:  Abscess    Pt was seen under the supervision of Dr. Jeraldine Loots.     Rachelle Hora, MD 05/28/14 [phone]

## 2014-05-28 NOTE — ED Notes (Signed)
Pt has abscsess recurrent to buttock, and leg, and reports sores in mouth along gum line

## 2014-05-29 NOTE — ED Provider Notes (Signed)
This patient was seen in conjunction with the resident physician, Dr. Katrinka BlazingSmith.  The documentation accurately reflects the patient's ED evaluation.  On my exam, this patient was in no distress. Patient described recent development of recurrent abscesses. He, his colleague and I had a lengthy conversation about the nature of recurrent infections, the need for further evaluation and management, consideration of cleansing therapy with his primary care physician. Patient was afebrile, in no distress, though with a tender, uncomfortable abscess in his gluteal cleft, he required incision and drainage. I was available for, doesn't for relevant portions of the exam, supervised the exam and procedure.    Gerhard Munchobert Delta Deshmukh, MD 05/29/14 [phone]

## 2014-07-10 ENCOUNTER — Emergency Department (HOSPITAL_COMMUNITY)
Admission: EM | Admit: 2014-07-10 | Discharge: 2014-07-10 | Disposition: A | Discharge status: Home / Self Care | Payer: Self-pay | Attending: Emergency Medicine | Admitting: Emergency Medicine

## 2014-07-10 ENCOUNTER — Encounter (HOSPITAL_COMMUNITY): Payer: Self-pay | Admitting: Emergency Medicine

## 2014-07-10 DIAGNOSIS — F17200 Nicotine dependence, unspecified, uncomplicated: Secondary | ICD-10-CM | POA: Insufficient documentation

## 2014-07-10 DIAGNOSIS — L0889 Other specified local infections of the skin and subcutaneous tissue: Secondary | ICD-10-CM | POA: Insufficient documentation

## 2014-07-10 DIAGNOSIS — L089 Local infection of the skin and subcutaneous tissue, unspecified: Secondary | ICD-10-CM | POA: Insufficient documentation

## 2014-07-10 MED ORDER — IBUPROFEN 200 MG PO TABS
600.0000 mg | ORAL_TABLET | Freq: Once | ORAL | Status: AC
  Administered 2014-07-10: 600 mg via ORAL
  Filled 2014-07-10: qty 3

## 2014-07-10 MED ORDER — DOXYCYCLINE HYCLATE 100 MG PO CAPS: 100.0000 mg | ORAL_CAPSULE | Freq: Two times a day (BID) | ORAL | Status: DC

## 2014-07-10 MED ORDER — IBUPROFEN 600 MG PO TABS: 600.0000 mg | ORAL_TABLET | Freq: Four times a day (QID) | ORAL | Status: DC | PRN

## 2014-07-10 NOTE — ED Notes (Signed)
Pt. reports abscess at scalp onset yesterday with no drainage.

## 2014-07-10 NOTE — ED Provider Notes (Signed)
CSN: [id number]     Arrival date & time 07/10/14  0052 History   First MD Initiated Contact with Patient 07/10/14 0114     Chief Complaint  Patient presents with  . Abscess     (Consider location/radiation/quality/duration/timing/severity/associated sxs/prior Treatment) HPI States he recently had his head shaved. The past 3 days he's noticed a gradual tender mass developing in the occipital region of the scalp. There's been no spontaneous rupture. He's had no fever or chills. Patient states he has had some increase swelling of lymph nodes of the posterior scalp and neck.    History reviewed. No pertinent past medical history. History reviewed. No pertinent past surgical history. No family history on file. History  Substance Use Topics  . Smoking status: Current Every Day Smoker -- 1.00 packs/day    Types: Cigarettes  . Smokeless tobacco: Not on file  . Alcohol Use: Yes     Comment: occas    Review of Systems  Constitutional: Negative for fever and chills.  Musculoskeletal: Negative for neck pain and neck stiffness.  All other systems reviewed and are negative.     Allergies  Review of patient's allergies indicates no known allergies.  Home Medications   Prior to Admission medications   Medication Sig Start Date End Date Taking? Authorizing Provider  doxycycline (VIBRAMYCIN) 100 MG capsule Take 1 capsule (100 mg total) by mouth 2 (two) times daily. One po bid x 7 days 07/10/14   Loren Raceravid Sieara Bremer, MD  ibuprofen (ADVIL,MOTRIN) 600 MG tablet Take 1 tablet (600 mg total) by mouth every 6 (six) hours as needed. 07/10/14   Loren Raceravid Issabelle Mcraney, MD   BP 118/75  Pulse 83  Temp(Src) 98.6 F (37 C) (Oral)  Resp 14  Ht 5\' 11"  (1.803 m)  Wt 167 lb (75.751 kg)  BMI 23.30 kg/m2  SpO2 99% Physical Exam  Nursing note and vitals reviewed. Constitutional: He is oriented to person, place, and time. He appears well-developed and well-nourished. No distress.  HENT:  Head: Normocephalic and  atraumatic.  Mouth/Throat: Oropharynx is clear and moist.  1 cm tender nodule noted at the midline occipital region. There is no fluctuance. There is mild warmth and erythema overlying. Patient has reactive lymphadenopathy in the lymph nodes of the posterior scalp.  Eyes: EOM are normal. Pupils are equal, round, and reactive to light.  Neck: Normal range of motion. Neck supple.  Cardiovascular: Normal rate and regular rhythm.   Pulmonary/Chest: Effort normal.  Abdominal: Soft. Bowel sounds are normal.  Musculoskeletal: Normal range of motion. He exhibits no edema and no tenderness.  Neurological: He is alert and oriented to person, place, and time.  Skin: Skin is warm and dry. No rash noted. No erythema.  Psychiatric: He has a normal mood and affect. His behavior is normal.    ED Course  Procedures (including critical care time) Labs Review Labs Reviewed - No data to display  Imaging Review No results found.   EKG Interpretation None      MDM   Final diagnoses:  Infection of scalp    Bedside ultrasound without any discernible fluid pocket. Start patient on antibiotics for likely early abscess/cellulitis of hair follicle. Return precautions given.    Loren Raceravid Tiana Sivertson, MD 07/10/14 [phone]

## 2014-07-10 NOTE — ED Notes (Addendum)
Pt c/o of bump on back of his head, left side x 2 days.  Pt reports being able to feel it when he moves his head, talks, etc.  Reports pain when pressure is applied to area.  Pt reports he recently had a haircut (pt has buzz cut).  Pt denies trauma  Area is raised.  No redness or heat noted.  Moveable upon palpation.

## 2014-07-10 NOTE — Discharge Instructions (Signed)

## 2014-09-14 ENCOUNTER — Encounter (HOSPITAL_COMMUNITY): Payer: Self-pay | Admitting: *Deleted

## 2014-09-14 ENCOUNTER — Emergency Department (HOSPITAL_COMMUNITY)
Admission: EM | Admit: 2014-09-14 | Discharge: 2014-09-14 | Disposition: A | Discharge status: Home / Self Care | Payer: Self-pay | Attending: Emergency Medicine | Admitting: Emergency Medicine

## 2014-09-14 DIAGNOSIS — Z792 Long term (current) use of antibiotics: Secondary | ICD-10-CM | POA: Insufficient documentation

## 2014-09-14 DIAGNOSIS — Z720 Tobacco use: Secondary | ICD-10-CM | POA: Insufficient documentation

## 2014-09-14 DIAGNOSIS — K047 Periapical abscess without sinus: Secondary | ICD-10-CM | POA: Insufficient documentation

## 2014-09-14 MED ORDER — PENICILLIN V POTASSIUM 500 MG PO TABS
500.0000 mg | ORAL_TABLET | Freq: Four times a day (QID) | ORAL | Status: AC
Start: 2014-09-14 — End: 2014-09-21

## 2014-09-14 MED ORDER — IBUPROFEN 800 MG PO TABS: 800.0000 mg | ORAL_TABLET | Freq: Three times a day (TID) | ORAL | Status: DC | PRN

## 2014-09-14 MED ORDER — HYDROCODONE-ACETAMINOPHEN 5-325 MG PO TABS: 1.0000 | ORAL_TABLET | Freq: Four times a day (QID) | ORAL | Status: DC | PRN

## 2014-09-14 NOTE — Discharge Instructions (Signed)
You have an infection of your tooth. Take penicillin 4 times a day for 10 days. Take ibuprofen 800mg  3 times a day for pain. Use the Norco as needed for severe pain. You can try OraGel (found at the drug store) to help with the pain as well.  If you develop fevers, trouble swallowing, or are not improving with the antibiotics, please come back.

## 2014-09-14 NOTE — ED Provider Notes (Signed)
CSN: [id number]     Arrival date & time 09/14/14  1741 History   First MD Initiated Contact with Patient 09/14/14 1758     Chief Complaint  Patient presents with  . Dental Pain     (Consider location/radiation/quality/duration/timing/severity/associated sxs/prior Treatment) HPI  He is a 33 year old man here for evaluation of dental pain. He states this has been going on for quite a while, but worsened in the last few days. There is a tooth on the lower left side that is very tender. He noticed some bloody discharge earlier today. He denies any pain with jaw movements, difficulty swallowing, swelling in his mouth. No fevers or chills. He had similar problems in the past, and had dental work done. He currently does not have a dentist.  History reviewed. No pertinent past medical history. History reviewed. No pertinent past surgical history. History reviewed. No pertinent family history. History  Substance Use Topics  . Smoking status: Current Every Day Smoker -- 1.00 packs/day    Types: Cigarettes  . Smokeless tobacco: Not on file  . Alcohol Use: Yes     Comment: occas    Review of Systems Dental pain No fever No nausea or vomiting No difficulty swallowing   Allergies  Review of patient's allergies indicates no known allergies.  Home Medications   Prior to Admission medications   Medication Sig Start Date End Date Taking? Authorizing Provider  doxycycline (VIBRAMYCIN) 100 MG capsule Take 1 capsule (100 mg total) by mouth 2 (two) times daily. One po bid x 7 days 07/10/14   Loren Raceravid Yelverton, MD  HYDROcodone-acetaminophen [facility](NORCO) 5-325 MG per tablet Take 1 tablet by mouth every 6 (six) hours as needed for moderate pain. 09/14/14   Charm RingsErin J Shekela Goodridge, MD  ibuprofen (ADVIL,MOTRIN) 800 MG tablet Take 1 tablet (800 mg total) by mouth every 8 (eight) hours as needed for moderate pain. 09/14/14   Charm RingsErin J Saaya Procell, MD  penicillin v potassium (VEETID) 500 MG tablet Take 1 tablet (500 mg total) by  mouth 4 (four) times daily. 09/14/14 09/21/14  Charm RingsErin J Xaiden Fleig, MD   BP 132/67 mmHg  Pulse 79  Temp(Src) 98.1 F (36.7 C) (Oral)  Resp 18  SpO2 100% Physical Exam  Constitutional: He is oriented to person, place, and time. He appears well-developed and well-nourished. No distress.  HENT:  Mouth/Throat:    No swelling of the tongue or floor of the mouth. No pain with jaw movement.  Neck: Neck supple.  Cardiovascular: Normal rate.   Pulmonary/Chest: Effort normal.  Neurological: He is alert and oriented to person, place, and time.    ED Course  Procedures (including critical care time) Labs Review Labs Reviewed - No data to display  Imaging Review No results found.   EKG Interpretation None      MDM   Final diagnoses:  Dental abscess    We'll treat with penicillin 500 mg 4 times a day for 10 days. Ibuprofen 800 mg 3 times a day for pain. Norco provided to use as needed for severe pain. Recommended use of over-the-counter Orajel. Discussed that he needs follow-up with a dentist to have the tooth pulled.    Charm RingsErin J Kennedy Bohanon, MD 09/14/14 [phone]

## 2014-09-14 NOTE — ED Notes (Signed)
Pt c/o left lower dental pain for about a month. Tooth is yellow/white. No deformity noted.

## 2014-09-14 NOTE — ED Notes (Signed)
Pt reports left lower side dental pain for several days. Airway intact.

## 2014-11-12 ENCOUNTER — Encounter (HOSPITAL_COMMUNITY): Payer: Self-pay | Admitting: Emergency Medicine

## 2014-11-12 ENCOUNTER — Emergency Department (HOSPITAL_COMMUNITY)
Admission: EM | Admit: 2014-11-12 | Discharge: 2014-11-12 | Disposition: A | Discharge status: Home / Self Care | Payer: Self-pay | Attending: Emergency Medicine | Admitting: Emergency Medicine

## 2014-11-12 DIAGNOSIS — Z792 Long term (current) use of antibiotics: Secondary | ICD-10-CM | POA: Insufficient documentation

## 2014-11-12 DIAGNOSIS — K0381 Cracked tooth: Secondary | ICD-10-CM | POA: Insufficient documentation

## 2014-11-12 DIAGNOSIS — K029 Dental caries, unspecified: Secondary | ICD-10-CM | POA: Insufficient documentation

## 2014-11-12 DIAGNOSIS — K088 Other specified disorders of teeth and supporting structures: Secondary | ICD-10-CM | POA: Insufficient documentation

## 2014-11-12 DIAGNOSIS — K0889 Other specified disorders of teeth and supporting structures: Secondary | ICD-10-CM

## 2014-11-12 DIAGNOSIS — Z720 Tobacco use: Secondary | ICD-10-CM | POA: Insufficient documentation

## 2014-11-12 MED ORDER — OXYCODONE-ACETAMINOPHEN 5-325 MG PO TABS: 1.0000 | ORAL_TABLET | ORAL | Status: DC | PRN

## 2014-11-12 MED ORDER — ONDANSETRON 4 MG PO TBDP
4.0000 mg | ORAL_TABLET | Freq: Once | ORAL | Status: AC
  Administered 2014-11-12: 4 mg via ORAL
  Filled 2014-11-12: qty 1

## 2014-11-12 MED ORDER — AMOXICILLIN 500 MG PO CAPS: 500.0000 mg | ORAL_CAPSULE | Freq: Three times a day (TID) | ORAL | Status: DC

## 2014-11-12 MED ORDER — OXYCODONE-ACETAMINOPHEN 5-325 MG PO TABS
2.0000 | ORAL_TABLET | Freq: Once | ORAL | Status: AC
  Administered 2014-11-12: 2 via ORAL
  Filled 2014-11-12: qty 2

## 2014-11-12 NOTE — ED Notes (Signed)
Case Manager at bedside

## 2014-11-12 NOTE — ED Provider Notes (Signed)
CSN: [id number]     Arrival date & time 11/12/14  1252 History  This chart was scribed for non-physician practitioner, Arthor CaptainAbigail Denym Rahimi, PA-C working with Gwyneth SproutWhitney Plunkett, MD by Greggory StallionKayla Andersen, ED scribe. This patient was seen in room WTR5/WTR5 and the patient's care was started at 1:34 PM.   Chief Complaint  Patient presents with  . Dental Pain   The history is provided by the patient. No language interpreter was used.    HPI Comments: Howard Newman is a 34 y.o. male who presents to the Emergency Department complaining of constant, aching left lower dental pain that started 2 weeks ago after chipping his tooth. Reports some drainage at night. He has taken OTC medications with no relief. Denies fever, chills, trouble swallowing, difficulty breathing.   History reviewed. No pertinent past medical history. History reviewed. No pertinent past surgical history. History reviewed. No pertinent family history. History  Substance Use Topics  . Smoking status: Current Every Day Smoker -- 1.00 packs/day    Types: Cigarettes  . Smokeless tobacco: Not on file  . Alcohol Use: Yes     Comment: occas    Review of Systems  Constitutional: Negative for fever and chills.  HENT: Positive for dental problem. Negative for trouble swallowing.   Eyes: Negative for redness.  Respiratory: Negative for shortness of breath.   Cardiovascular: Negative for chest pain.  Gastrointestinal: Negative for abdominal distention.  Musculoskeletal: Negative for gait problem.  Skin: Negative for rash.  Neurological: Negative for speech difficulty.  Psychiatric/Behavioral: Negative for confusion.   Allergies  Review of patient's allergies indicates no known allergies.  Home Medications   Prior to Admission medications   Medication Sig Start Date End Date Taking? Authorizing Provider  acetaminophen (TYLENOL) 500 MG tablet Take 1,000 mg by mouth every 6 (six) hours as needed for moderate pain.   Yes Historical  Provider, MD  doxycycline (VIBRAMYCIN) 100 MG capsule Take 1 capsule (100 mg total) by mouth 2 (two) times daily. One po bid x 7 days Patient not taking: Reported on 11/12/2014 07/10/14   Loren Raceravid Yelverton, MD  HYDROcodone-acetaminophen [facility](NORCO) 5-325 MG per tablet Take 1 tablet by mouth every 6 (six) hours as needed for moderate pain. Patient not taking: Reported on 11/12/2014 09/14/14   Charm RingsErin J Honig, MD  ibuprofen (ADVIL,MOTRIN) 800 MG tablet Take 1 tablet (800 mg total) by mouth every 8 (eight) hours as needed for moderate pain. Patient not taking: Reported on 11/12/2014 09/14/14   Charm RingsErin J Honig, MD   BP 123/76 mmHg  Pulse 63  Temp(Src) 98.7 F (37.1 C) (Oral)  Resp 18  SpO2 99%   Physical Exam  Constitutional: He is oriented to person, place, and time. He appears well-developed and well-nourished. No distress.  HENT:  Head: Normocephalic and atraumatic.  Second molar on the left lower is broken with dental caries. Exposed pulp noted.   Eyes: Conjunctivae and EOM are normal.  Neck: Neck supple. No tracheal deviation present.  Cardiovascular: Normal rate.   Pulmonary/Chest: Effort normal. No respiratory distress.  Musculoskeletal: Normal range of motion.  Neurological: He is alert and oriented to person, place, and time.  Skin: Skin is warm and dry.  Psychiatric: He has a normal mood and affect. His behavior is normal.  Nursing note and vitals reviewed.   ED Course  Procedures (including critical care time)  DIAGNOSTIC STUDIES: Oxygen Saturation is 99% on RA, normal by my interpretation.    COORDINATION OF CARE: 1:36 PM-Discussed treatment plan which  includes an antibiotic and pain medication with pt at bedside and pt agreed to plan. Will give pt dental referrals and advised him to follow up.   Labs Review Labs Reviewed - No data to display  Imaging Review No results found.   EKG Interpretation None      MDM   Final diagnoses:  Dentalgia  Dental caries    Patient with  toothache.  No gross abscess.  Exam unconcerning for Ludwig's angina or spread of infection.  Will treat with penicillin and pain medicine.  Urged patient to follow-up with dentist.     I personally performed the services described in this documentation, which was scribed in my presence. The recorded information has been reviewed and is accurate.  Arthor Captain, PA-C 11/12/14 1351  Gwyneth Sprout, MD 11/12/14 1511

## 2014-11-12 NOTE — Discharge Instructions (Signed)
You have been diagnosed with Dental pain. Please call the follow up dentist first thing in the morning on Monday for a follow up appointment. Keep your discharge paperwork from today's visit to bring to the dentist office. You may also use the resource guide listed below to help you find a dentist if you do not already have one to followup with. It is very important that you get evaluated by a dentist as soon as possible.  Use your pain medication as prescribed and do not operate heavy machinery while on pain medication. Note that your pain medication contains acetaminophen (Tylenol) & its is not reccommended that you use additional acetaminophen (Tylenol) while taking this medication. Take your full course of antibiotics. Read the instructions below. ° °Eat a soft or liquid diet and rinse your mouth out after meals with warm water. You should see a dentist or return here at once if you have increased swelling, increased pain or uncontrolled bleeding from the site of your injury. ° ° °SEEK MEDICAL CARE IF:  °· You have increased pain not controlled with medicines.  °· You have swelling around your tooth, in your face or neck.  °· You have bleeding which starts, continues, or gets worse.  °· You have a fever >101 °· If you are unable to open your mouth °Soft Diet  °The soft diet may be recommended after you were put on a full liquid diet. A normal diet may follow. The soft diet can also be used after surgery if you are too ill to keep down a normal diet. The soft diet may also be needed if you have a hard time chewing foods.  °DESCRIPTION  °Tender foods are used. Foods do not need to be ground or pureed. Most raw fruits and vegetables and coarse breads and cereals should be avoided. Fried foods and highly seasoned foods may cause discomfort.  °NUTRITIONAL ADEQUACY  °A healthy diet is possible if foods from each of the basic food groups are eaten daily.  °SOFT DIET FOOD LISTS  °Milk/Dairy  °Allowed: Milk and milk  drinks, milk shakes, cream cheese, cottage cheese, mild cheeses.  °Avoid: Sharp or highly seasoned cheese. °Meat/Meat Substitutes  °Allowed: Broiled, roasted, baked, or stewed tender lean beef, mutton, lamb, veal, chicken, turkey, liver, ham, crisp bacon, white fish, tuna, salmon. Eggs, smooth peanut butter.  °Avoid: All fried meats, fish, or fowl. Rich gravies and sauces. Lunch meats, sausages, hot dogs. Meats with gristle, chunky peanut butter. °Breads/Grains  °Allowed: Rice, noodles, spaghetti, macaroni. Dry or cooked refined cereals, such as farina, cream of wheat, oatmeal, grits, whole-wheat cereals. Plain or toasted white or wheat blend or whole-grain breads, soda crackers or saltines, flour tortillas.  °Avoid: Wild rice, coarse cereals, such as bran. Seed in or on breads and crackers. Bread or bread products with nuts or seeds. °Fruits/Vegetables  °Allowed: Fruit and vegetable juices, well-cooked or canned fruits and vegetables, any dried fruit. One citrus fruit daily, 1 vitamin A source daily. Well-ripened, easy to chew fruits, sweet potatoes. Baked, boiled, mashed, creamed, scalloped, or au gratin potatoes. Broths or creamed soups made with allowed vegetables, strained tomatoes.  °Avoid: All gas-forming vegetables (corn, radishes, Brussels sprouts, onions, broccoli, cabbage, parsnips, turnips, chili peppers, pinto beans, split peas, dried beans). Fruits containing seeds and skin. Potato chips and corn chips. All others that are not made with allowed vegetables. Highly seasoned soups. °Desserts/Sweets  °Allowed: Simple desserts, such as custard, junkets, gelatin desserts, plain ice cream and sherbets, simple cakes   and cookies, allowed fruits, sugar, syrup, jelly, honey, plain hard candy, and molasses.  °Avoid: Rich pastries, any dessert containing dates, nuts, raisins, or coconut. Fried pastries, such as doughnuts. Chocolate. °Beverages  °Allowed: Fruit and vegetable juices. Caffeine-free carbonated drinks,  coffee, and tea.  °Avoid: Caffeinated beverages: coffee, tea, soda or pop. °Miscellaneous  °Allowed: Butter, cream, margarine, mayonnaise, oil. Cream sauces, salt, and mild spices.  °Avoid: Highly spiced salad dressings. Highly seasoned foods, hot sauce, mustard, horseradish, and pepper. °SAMPLE MENU  °Breakfast  °Orange juice.  °Oatmeal.  °Soft cooked egg.  °Toast and margarine.  °2% milk.  °Coffee. °Lunch  °Meatloaf.  °Mashed potato.  °Green beans.  °Lemon pudding.  °Bread and margarine.  °Coffee. °Dinner  °Consommé or apricot nectar.  °Chicken breast.  °Rice, peas, and carrots.  °Applesauce.  °Bread and margarine.  °2% milk. °To cut the amount of fat in your diet, omit margarine and use 1% or skim milk.  °NUTRIENT ANALYSIS  °Calories........................1953 Kcal.  °Protein.........................102 gm.  °Carbohydrate...............247 gm.  °Fat................................65 gm.  °Cholesterol...................449 mg.  °Dietary fiber.................19 gm.  °Vitamin A.....................2944 RE.  °Vitamin C.....................79 mg.  °Niacin..........................25 mg.  °Riboflavin....................2.0 mg.  °Thiamin.......................1.5 mg.  °Folate..........................249 mcg.  °Calcium.......................1030 mg.  °Phosphorus.................1782 mg.  °Zinc..............................12 mg.  °Iron..............................13 mg.  °Sodium.........................299 mg.  °Potassium....................3046 mg. °Document Released: 01/04/2008 Document Revised: 12/20/2011 Document Reviewed: 01/04/2008  °ExitCare® Patient Information ©2014 [employer].  ° °RESOURCE GUIDE ° ° °Dental Problems ° °Dr. Janna Civilis °$200 dollar visit °[address] °[place], [place] [address]  °[phone] °  ° °Patients with Medicaid: °[facility]                     [facility] °[address]                                           [address] °Phone:   [phone]                                                  Phone:  510-2600 ° °If unable to pay or uninsured, contact:  [facility] or [facility] to become qualified for the adult dental clinic. ° °Chronic Pain Problems °Contact [facility]  [phone] °Patients need to be referred by their primary care doctor. ° °Insufficient Money for Medicine °Contact [employer]:  call "211" or [facility] 271-5999. ° °No Primary Care Doctor °Call [facility]  832-8000 °Other agencies that provide inexpensive medical care °   [facility]  [phone] °   [facility]  [phone] °   [facility]  [phone] °   [facility]  [phone] °   [facility]  [phone] °   [facility]  [phone] ° °Psychological Services °[facility]  [phone] °[facility]  [phone] °[facility]   [phone] (emergency services 641-4993) ° °Substance Abuse Resources °[facility]  336-882-2125 °[facility] 336-784-9470 °[facility] 336-285-9073 °[facility] 336-845-3988 °Residential & Outpatient Substance Abuse Program  800-659-3381 ° °Abuse/Neglect °[place] Child Abuse Hotline (336) 641-3795 °[place] Child Abuse Hotline 800-378-5315 (After Hours) ° °Emergency Shelter °   [facility] (336) 271-5985 ° °Maternity Homes °[facility] (336) 275-9566 °[facility] (704) 372-4663 ° °MRSA Hotline #:   832-7006 ° ° ° °[place] Resources ° °[facility] [place]     [employer]                          [facility] °[address] [place]                       [address]      [address]  °[place]                                                [place]                            [place] °Phone:  349-3220                                   Phone:  [phone]                 Phone:   342-8140 ° °[facility] °Phone:  342-8316 ° °[place] Child Abuse Hotline °(336) 342-1394 °(336) 342-3537 (After Hours) ° ° ° ° ° ° °

## 2014-11-12 NOTE — Progress Notes (Signed)
  CARE MANAGEMENT ED NOTE 11/12/2014  Patient:  Howard Newman,Howard Newman   Account Number:  [id number]  Date Initiated:  11/12/2014  Documentation initiated by:  Edd ArbourGIBBS,Jazmynn Pho  Subjective/Objective Assessment:   34 [place] with dental pain. In addition to previous note, patient mentions he has dry skin on his right lower leg that he wants examined.        Subjective/Objective Assessment Detail:   pt confirms no pcp     Action/Plan:   spoke with pt about self pay resources see notes below   Action/Plan Detail:   Anticipated DC Date:  11/12/2014     Status Recommendation to Physician:   Result of Recommendation:    Other ED Services  Consult Working Plan    DC Planning Services  Other  Outpatient Services - Pt will follow up  PCP issues  [facility] / P[facility] (established/new)    Choice offered to / List presented to:            Status of service:  Completed, signed off  ED Comments:   ED Comments Detail:  CM spoke with pt who [place] [facility]. CM discussed and provided written information for self pay pcps, importance of pcp for f/u care, www.needymeds.org, www.goodrx.com, discounted pharmacies and other [place] resources such as [facility], [facility], affordable care act,  financial assistance, DSS and  health department  Reviewed resources for [place] self pay pcps like [facility], [facility] medicine at [address], [facility], general medical clinics, [facility] urgent [facility] plus others, medication resources, [facility] out patient pharmacies and housing Pt voiced understanding and appreciation of resources provided  Provided [facility] contact information Agreed to referral Completed referral

## 2014-11-12 NOTE — ED Notes (Addendum)
Pt c/o of left molar tooth chip with dental pain. In addition to previous note, patient mentions he has dry skin on his right lower leg that he wants examined.

## 2014-12-06 ENCOUNTER — Encounter (HOSPITAL_COMMUNITY): Payer: Self-pay | Admitting: Emergency Medicine

## 2014-12-06 ENCOUNTER — Emergency Department (HOSPITAL_COMMUNITY)
Admission: EM | Admit: 2014-12-06 | Discharge: 2014-12-06 | Disposition: A | Discharge status: Home / Self Care | Payer: Self-pay | Attending: Emergency Medicine | Admitting: Emergency Medicine

## 2014-12-06 DIAGNOSIS — Z720 Tobacco use: Secondary | ICD-10-CM | POA: Insufficient documentation

## 2014-12-06 DIAGNOSIS — K029 Dental caries, unspecified: Secondary | ICD-10-CM | POA: Insufficient documentation

## 2014-12-06 DIAGNOSIS — K088 Other specified disorders of teeth and supporting structures: Secondary | ICD-10-CM | POA: Insufficient documentation

## 2014-12-06 DIAGNOSIS — R42 Dizziness and giddiness: Secondary | ICD-10-CM | POA: Insufficient documentation

## 2014-12-06 DIAGNOSIS — R51 Headache: Secondary | ICD-10-CM | POA: Insufficient documentation

## 2014-12-06 MED ORDER — AMOXICILLIN 500 MG PO CAPS: 500.0000 mg | ORAL_CAPSULE | Freq: Three times a day (TID) | ORAL | Status: DC

## 2014-12-06 MED ORDER — IBUPROFEN 800 MG PO TABS: 800.0000 mg | ORAL_TABLET | Freq: Three times a day (TID) | ORAL | Status: DC | PRN

## 2014-12-06 NOTE — ED Provider Notes (Signed)
CSN: [id number]     Arrival date & time 12/06/14  1846 History  This chart was scribed for Fayrene Helper, PA-C working with Richardean Canal, MD by Evon Slack, ED Scribe. This patient was seen in room TR07C/TR07C and the patient's care was started at 7:03 PM.    Chief Complaint  Patient presents with  . Dental Pain   The history is provided by the patient. No language interpreter was used.   HPI Comments: Howard Newman is a 34 y.o. male who presents to the Emergency Department complaining of sudden intermittent severe lower left sided dental pain onset today. Pt describes the pain as throbbing. Pt states that he has associated HA and lightheadedness with onset of pain. Pt states that 2 weeks prior the bottom tooth chipped that has caused intermittent pain. Pt states that tooth is also sensitive to the cold. Pt has tried tylenol and Advil with no relief. Pt states that he is trying to get in contact with dentist. Pt denies fever or other related symptoms.     History reviewed. No pertinent past medical history. History reviewed. No pertinent past surgical history. History reviewed. No pertinent family history. History  Substance Use Topics  . Smoking status: Current Every Day Smoker -- 1.00 packs/day    Types: Cigarettes  . Smokeless tobacco: Not on file  . Alcohol Use: Yes     Comment: occas    Review of Systems  Constitutional: Negative for fever.  HENT: Positive for dental problem.   Neurological: Positive for light-headedness and headaches.     Allergies  Review of patient's allergies indicates no known allergies.  Home Medications   Prior to Admission medications   Medication Sig Start Date End Date Taking? Authorizing Provider  acetaminophen (TYLENOL) 500 MG tablet Take 1,000 mg by mouth every 6 (six) hours as needed for moderate pain.    Historical Provider, MD  amoxicillin (AMOXIL) 500 MG capsule Take 1 capsule (500 mg total) by mouth 3 (three) times daily. 12/06/14    Fayrene Helper, PA-C  doxycycline (VIBRAMYCIN) 100 MG capsule Take 1 capsule (100 mg total) by mouth 2 (two) times daily. One po bid x 7 days Patient not taking: Reported on 11/12/2014 07/10/14   Loren Racer, MD  HYDROcodone-acetaminophen [facility]) 5-325 MG per tablet Take 1 tablet by mouth every 6 (six) hours as needed for moderate pain. Patient not taking: Reported on 11/12/2014 09/14/14   Charm Rings, MD  ibuprofen (ADVIL,MOTRIN) 800 MG tablet Take 1 tablet (800 mg total) by mouth every 8 (eight) hours as needed for moderate pain. 12/06/14   Fayrene Helper, PA-C  oxyCODONE-acetaminophen (PERCOCET) 5-325 MG per tablet Take 1-2 tablets by mouth every 4 (four) hours as needed. 11/12/14   Arthor Captain, PA-C   BP 114/74 mmHg  Pulse 75  Temp(Src) 98.1 F (36.7 C)  Resp 18  Wt 176 lb 8 oz (80.06 kg)  SpO2 100%   Physical Exam  Constitutional: He is oriented to person, place, and time. He appears well-developed and well-nourished. No distress.  HENT:  Head: Normocephalic and atraumatic.  Mouth/Throat: No trismus in the jaw.  Tooth 17 moderate dental decay involving 40% of tooth with tenderness to palpation no obvious abscess and no trismus.  Eyes: Conjunctivae and EOM are normal.  Neck: Neck supple. No tracheal deviation present.  Cardiovascular: Normal rate.   Pulmonary/Chest: Effort normal. No respiratory distress.  Musculoskeletal: Normal range of motion.  Neurological: He is alert and oriented to person,  place, and time.  Skin: Skin is warm and dry.  Psychiatric: He has a normal mood and affect. His behavior is normal.  Nursing note and vitals reviewed.   ED Course  Procedures (including critical care time) DIAGNOSTIC STUDIES: Oxygen Saturation is 100% on RA, normal by my interpretation.    COORDINATION OF CARE: 7:28 PM-Discussed treatment plan with pt at bedside and pt agreed to plan.   Offered pt dental block, but pt declined.    Labs Review Labs Reviewed - No data to  display  Imaging Review No results found.   EKG Interpretation None      MDM   Final diagnoses:  Pain due to dental caries    BP 114/74 mmHg  Pulse 75  Temp(Src) 98.1 F (36.7 C)  Resp 18  Wt 176 lb 8 oz (80.06 kg)  SpO2 100%   I personally performed the services described in this documentation, which was scribed in my presence. The recorded information has been reviewed and is accurate.       Fayrene HelperBowie Yarexi Pawlicki, PA-C 12/06/14 1937  Richardean Canalavid H Yao, MD 12/06/14 [id number]

## 2014-12-06 NOTE — ED Notes (Signed)
Pt reports that one of his bottom left teeth broke off recently, he began having severe pain today after stepping outside in the cold.

## 2014-12-06 NOTE — Discharge Instructions (Signed)

## 2014-12-27 ENCOUNTER — Encounter (HOSPITAL_COMMUNITY): Payer: Self-pay

## 2014-12-27 ENCOUNTER — Emergency Department (HOSPITAL_COMMUNITY)
Admission: EM | Admit: 2014-12-27 | Discharge: 2014-12-28 | Disposition: A | Discharge status: Home / Self Care | Payer: No Typology Code available for payment source | Attending: Emergency Medicine | Admitting: Emergency Medicine

## 2014-12-27 DIAGNOSIS — Z720 Tobacco use: Secondary | ICD-10-CM | POA: Diagnosis not present

## 2014-12-27 DIAGNOSIS — S3992XA Unspecified injury of lower back, initial encounter: Secondary | ICD-10-CM | POA: Diagnosis present

## 2014-12-27 DIAGNOSIS — S39012A Strain of muscle, fascia and tendon of lower back, initial encounter: Secondary | ICD-10-CM | POA: Diagnosis not present

## 2014-12-27 DIAGNOSIS — Y939 Activity, unspecified: Secondary | ICD-10-CM | POA: Insufficient documentation

## 2014-12-27 DIAGNOSIS — Z792 Long term (current) use of antibiotics: Secondary | ICD-10-CM | POA: Insufficient documentation

## 2014-12-27 DIAGNOSIS — Y999 Unspecified external cause status: Secondary | ICD-10-CM | POA: Diagnosis not present

## 2014-12-27 DIAGNOSIS — Y92410 Unspecified street and highway as the place of occurrence of the external cause: Secondary | ICD-10-CM | POA: Diagnosis not present

## 2014-12-27 MED ORDER — TRAMADOL HCL 50 MG PO TABS
50.0000 mg | ORAL_TABLET | Freq: Once | ORAL | Status: AC
  Administered 2014-12-28: 50 mg via ORAL
  Filled 2014-12-27: qty 1

## 2014-12-27 NOTE — ED Notes (Addendum)
Went to give pt Tramadol. Pt states he wants to wait until after xray so he can take with food. Pt states he does not tolerate medication well on empty stomach.
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
## 2014-12-27 NOTE — ED Notes (Signed)
Pt reports mid lower lumbar pain.

## 2014-12-28 ENCOUNTER — Emergency Department (HOSPITAL_COMMUNITY): Payer: No Typology Code available for payment source

## 2014-12-28 MED ORDER — TRAMADOL HCL 50 MG PO TABS: 50.0000 mg | ORAL_TABLET | Freq: Four times a day (QID) | ORAL | Status: DC | PRN
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
## 2014-12-28 NOTE — ED Notes (Signed)
Pt A&OX4, ambulatory at d/c with steady gait but wheeled out of ED via wheelchair, NAD 

## 2014-12-28 NOTE — ED Notes (Signed)
Pt transported to xray 

## 2015-01-09 ENCOUNTER — Emergency Department (HOSPITAL_COMMUNITY)
Admission: EM | Admit: 2015-01-09 | Discharge: 2015-01-09 | Disposition: A | Discharge status: Home / Self Care | Payer: Self-pay | Attending: Emergency Medicine | Admitting: Emergency Medicine

## 2015-01-09 ENCOUNTER — Encounter (HOSPITAL_COMMUNITY): Payer: Self-pay | Admitting: *Deleted

## 2015-01-09 DIAGNOSIS — K029 Dental caries, unspecified: Secondary | ICD-10-CM | POA: Insufficient documentation

## 2015-01-09 DIAGNOSIS — K0381 Cracked tooth: Secondary | ICD-10-CM | POA: Insufficient documentation

## 2015-01-09 DIAGNOSIS — K088 Other specified disorders of teeth and supporting structures: Secondary | ICD-10-CM | POA: Insufficient documentation

## 2015-01-09 DIAGNOSIS — Z720 Tobacco use: Secondary | ICD-10-CM | POA: Insufficient documentation

## 2015-01-09 DIAGNOSIS — K0889 Other specified disorders of teeth and supporting structures: Secondary | ICD-10-CM

## 2015-01-09 MED ORDER — PENICILLIN V POTASSIUM 250 MG PO TABS
500.0000 mg | ORAL_TABLET | Freq: Once | ORAL | Status: AC
  Administered 2015-01-09: 500 mg via ORAL
  Filled 2015-01-09: qty 2

## 2015-01-09 MED ORDER — NAPROXEN 250 MG PO TABS
500.0000 mg | ORAL_TABLET | Freq: Once | ORAL | Status: AC
  Administered 2015-01-09: 500 mg via ORAL
  Filled 2015-01-09: qty 2

## 2015-01-09 MED ORDER — NAPROXEN 500 MG PO TABS: 500.0000 mg | ORAL_TABLET | Freq: Two times a day (BID) | ORAL | Status: DC

## 2015-01-09 MED ORDER — PENICILLIN V POTASSIUM 500 MG PO TABS: 500.0000 mg | ORAL_TABLET | Freq: Four times a day (QID) | ORAL | Status: DC

## 2015-01-09 NOTE — Discharge Instructions (Signed)
Take naprosyn as needed for pain. Take Veetid as directed until gone. Refer to attached documents for more information. Call the recommended dentist for further evaluation of your dental pain.

## 2015-01-09 NOTE — ED Notes (Signed)
1527 vitals charted in error.

## 2015-01-09 NOTE — ED Provider Notes (Signed)
CSN: [id number]     Arrival date & time 01/09/15  1447 History  This chart was scribed for non-physician practitioner, Emilia Beck, PA-C, working with Eber Hong, MD, by Abel Presto, ED Scribe. This patient was seen in room TR08C/TR08C and the patient's care was started at 3:45 PM.    Chief Complaint  Patient presents with  . Dental Pain  . Abscess    Patient is a 34 y.o. male presenting with tooth pain and abscess. The history is provided by the patient. No language interpreter was used.  Dental Pain Location:  Lower Lower teeth location:  17/LL 3rd molar Quality:  Dull Severity:  Moderate Onset quality:  Gradual Duration:  3 months Timing:  Constant Progression:  Unchanged Chronicity:  New Associated symptoms: no facial swelling and no fever   Abscess Associated symptoms: no fever    HPI Comments: Howard Newman is a 34 y.o. male who presents to the Emergency Department complaining of left lower dental pain with onset 3-4 months ago. Pt does not have a dentist. Pt denies fever, chills, or facial swelling.   History reviewed. No pertinent past medical history. History reviewed. No pertinent past surgical history. History reviewed. No pertinent family history. History  Substance Use Topics  . Smoking status: Current Every Day Smoker -- 1.00 packs/day    Types: Cigarettes  . Smokeless tobacco: Not on file  . Alcohol Use: Yes     Comment: occas    Review of Systems  Constitutional: Negative for fever and chills.  HENT: Positive for dental problem. Negative for facial swelling.   All other systems reviewed and are negative.     Allergies  Review of patient's allergies indicates no known allergies.  Home Medications   Prior to Admission medications   Medication Sig Start Date End Date Taking? Authorizing Provider  acetaminophen (TYLENOL) 500 MG tablet Take 1,000 mg by mouth every 6 (six) hours as needed for moderate pain.    Historical Provider, MD   amoxicillin (AMOXIL) 500 MG capsule Take 1 capsule (500 mg total) by mouth 3 (three) times daily. Patient not taking: Reported on 12/28/2014 12/06/14   Fayrene Helper, PA-C  doxycycline (VIBRAMYCIN) 100 MG capsule Take 1 capsule (100 mg total) by mouth 2 (two) times daily. One po bid x 7 days Patient not taking: Reported on 11/12/2014 07/10/14   Loren Racer, MD  HYDROcodone-acetaminophen [facility]) 5-325 MG per tablet Take 1 tablet by mouth every 6 (six) hours as needed for moderate pain. Patient not taking: Reported on 11/12/2014 09/14/14   Charm Rings, MD  ibuprofen (ADVIL,MOTRIN) 800 MG tablet Take 1 tablet (800 mg total) by mouth every 8 (eight) hours as needed for moderate pain. Patient not taking: Reported on 12/28/2014 12/06/14   Fayrene Helper, PA-C  naproxen (NAPROSYN) 500 MG tablet Take 1 tablet (500 mg total) by mouth 2 (two) times daily with a meal. 01/09/15   Emilia Beck, PA-C  oxyCODONE-acetaminophen (PERCOCET) 5-325 MG per tablet Take 1-2 tablets by mouth every 4 (four) hours as needed. Patient not taking: Reported on 12/28/2014 11/12/14   Arthor Captain, PA-C  penicillin v potassium (VEETID) 500 MG tablet Take 1 tablet (500 mg total) by mouth 4 (four) times daily. 01/09/15   Marti Acebo, PA-C  traMADol (ULTRAM) 50 MG tablet Take 1 tablet (50 mg total) by mouth every 6 (six) hours as needed. 12/28/14   Cathren Laine, MD   BP 122/80 mmHg  Pulse 67  Temp(Src) 98.1 F (36.7 C) (  Oral)  Resp 16  SpO2 97% Physical Exam  Constitutional: He is oriented to person, place, and time. He appears well-developed and well-nourished. No distress.  HENT:  Head: Normocephalic and atraumatic.  adentulous to left upper mouth; left lower posterior molar, cracked, decayed, and tender to percussion  Eyes: Conjunctivae and EOM are normal.  Neck: Normal range of motion. Neck supple.  Cardiovascular: Normal rate and regular rhythm.  Exam reveals no gallop and no friction rub.   No murmur  heard. Pulmonary/Chest: Effort normal and breath sounds normal. He has no wheezes. He has no rales. He exhibits no tenderness.  Abdominal: Soft. He exhibits no distension. There is no tenderness. There is no rebound and no guarding.  Musculoskeletal: Normal range of motion.  Neurological: He is alert and oriented to person, place, and time. Coordination normal.  Speech is goal-oriented. Moves limbs without ataxia.   Skin: Skin is warm and dry.  Psychiatric: He has a normal mood and affect. His behavior is normal.  Nursing note and vitals reviewed.   ED Course  Procedures (including critical care time) DIAGNOSTIC STUDIES: Oxygen Saturation is 97% on room air, normal by my interpretation.    COORDINATION OF CARE: 3:49 PM Discussed treatment plan with patient at beside, the patient agrees with the plan and has no further questions at this time.   Labs Review Labs Reviewed - No data to display  Imaging Review No results found.   EKG Interpretation None      MDM   Final diagnoses:  Pain, dental   No ludwigs angina. Patient will have veetid, naprosyn, and dental referral. Patient instructed to return with worsening or concerning symptoms.   I personally performed the services described in this documentation, which was scribed in my presence. The recorded information has been reviewed and is accurate.     Emilia BeckKaitlyn Vinayak Bobier, PA-C 01/09/15 1614  Eber HongBrian Miller, MD 01/10/15 [phone]

## 2015-01-09 NOTE — ED Notes (Signed)
Pt reports left lower dental pain for a long time. Pt states no relief with OTC pain medications.

## 2015-01-22 ENCOUNTER — Encounter (HOSPITAL_COMMUNITY): Payer: Self-pay | Admitting: Physical Medicine and Rehabilitation

## 2015-01-22 ENCOUNTER — Emergency Department (HOSPITAL_COMMUNITY)
Admission: EM | Admit: 2015-01-22 | Discharge: 2015-01-22 | Disposition: A | Discharge status: Home / Self Care | Payer: Self-pay | Attending: Emergency Medicine | Admitting: Emergency Medicine

## 2015-01-22 DIAGNOSIS — K088 Other specified disorders of teeth and supporting structures: Secondary | ICD-10-CM | POA: Insufficient documentation

## 2015-01-22 DIAGNOSIS — K0381 Cracked tooth: Secondary | ICD-10-CM | POA: Insufficient documentation

## 2015-01-22 DIAGNOSIS — Z720 Tobacco use: Secondary | ICD-10-CM | POA: Insufficient documentation

## 2015-01-22 DIAGNOSIS — G8929 Other chronic pain: Secondary | ICD-10-CM | POA: Insufficient documentation

## 2015-01-22 DIAGNOSIS — K089 Disorder of teeth and supporting structures, unspecified: Secondary | ICD-10-CM

## 2015-01-22 DIAGNOSIS — Z792 Long term (current) use of antibiotics: Secondary | ICD-10-CM | POA: Insufficient documentation

## 2015-01-22 MED ORDER — IBUPROFEN 600 MG PO TABS: 600.0000 mg | ORAL_TABLET | Freq: Four times a day (QID) | ORAL | Status: DC | PRN

## 2015-01-22 NOTE — ED Notes (Signed)
Melissa, Consulting civil engineerCharge RN in to speak with pt

## 2015-01-22 NOTE — Discharge Instructions (Signed)
Please follow up with a dentist for your chronic dental pain. ibprofen or tylenol for pain

## 2015-01-22 NOTE — ED Provider Notes (Signed)
CSN: [id number]     Arrival date & time 01/22/15  1554 History  This chart was scribed for non-physician practitioner, Lottie Mussel, PA-C, working with Cathren Laine, MD, by [name], ED Scribe. This patient was seen in room TR11C/TR11C and the patient's care was started at 4:32 PM.    Chief Complaint  Patient presents with  . Dental Pain   The history is provided by the patient. No language interpreter was used.     HPI Comments: Howard Newman is a 34 y.o. male who presents to the Emergency Department complaining of lower left dental pain that has been going on for "a while." Patient states his tooth is fractured and is trying to have it extracted. Did not follow up as referred last time. No other complaints.   History reviewed. No pertinent past medical history. History reviewed. No pertinent past surgical history. No family history on file. History  Substance Use Topics  . Smoking status: Current Every Day Smoker -- 1.00 packs/day    Types: Cigarettes  . Smokeless tobacco: Not on file  . Alcohol Use: Yes    Review of Systems  Constitutional: Negative for fever and chills.  HENT: Positive for dental problem. Negative for facial swelling.       Allergies  Review of patient's allergies indicates no known allergies.  Home Medications   Prior to Admission medications   Medication Sig Start Date End Date Taking? Authorizing Provider  acetaminophen (TYLENOL) 500 MG tablet Take 1,000 mg by mouth every 6 (six) hours as needed for moderate pain.    Historical Provider, MD  amoxicillin (AMOXIL) 500 MG capsule Take 1 capsule (500 mg total) by mouth 3 (three) times daily. Patient not taking: Reported on 12/28/2014 12/06/14   Fayrene Helper, PA-C  doxycycline (VIBRAMYCIN) 100 MG capsule Take 1 capsule (100 mg total) by mouth 2 (two) times daily. One po bid x 7 days Patient not taking: Reported on 11/12/2014 07/10/14   Loren Racer, MD  HYDROcodone-acetaminophen [facility]) 5-325 MG  per tablet Take 1 tablet by mouth every 6 (six) hours as needed for moderate pain. Patient not taking: Reported on 11/12/2014 09/14/14   Charm Rings, MD  ibuprofen (ADVIL,MOTRIN) 800 MG tablet Take 1 tablet (800 mg total) by mouth every 8 (eight) hours as needed for moderate pain. Patient not taking: Reported on 12/28/2014 12/06/14   Fayrene Helper, PA-C  naproxen (NAPROSYN) 500 MG tablet Take 1 tablet (500 mg total) by mouth 2 (two) times daily with a meal. 01/09/15   Emilia Beck, PA-C  oxyCODONE-acetaminophen (PERCOCET) 5-325 MG per tablet Take 1-2 tablets by mouth every 4 (four) hours as needed. Patient not taking: Reported on 12/28/2014 11/12/14   Arthor Captain, PA-C  penicillin v potassium (VEETID) 500 MG tablet Take 1 tablet (500 mg total) by mouth 4 (four) times daily. 01/09/15   Kaitlyn Szekalski, PA-C  traMADol (ULTRAM) 50 MG tablet Take 1 tablet (50 mg total) by mouth every 6 (six) hours as needed. 12/28/14   Cathren Laine, MD   BP 117/68 mmHg  Pulse 78  Temp(Src) 98.7 F (37.1 C) (Oral)  Resp 18  SpO2 99% Physical Exam  Constitutional: He appears well-developed.  HENT:  Head: Normocephalic.  Broken off 1st lower left molar. No gum swelling. No facial swelling. No swelling under the tongue, trismus or evidence of ludwig's  Anginal   Eyes: Conjunctivae are normal.  Neck: Neck supple.  Nursing note and vitals reviewed.   ED Course  Procedures (including critical care time)  DIAGNOSTIC STUDIES: Oxygen Saturation is 99% on room air, normal by my interpretation.    MDM   Final diagnoses:  Chronic dental pain   Pt's 5th visit for dental pain in the last 4 months. No fever, no signs of infection based on quick exam. Last time when was given a referral did not call within two days. No emergent tx indicated today. Home with a dentist follow up.   Filed Vitals:   01/22/15 1559  BP: 117/68  Pulse: 78  Temp: 98.7 F (37.1 C)  TempSrc: Oral  Resp: 18  SpO2: 99%    I personally  performed the services described in this documentation, which was scribed in my presence. The recorded information has been reviewed and is accurate.    Jaynie Crumbleatyana Henny Strauch, PA-C 01/22/15 1642  Cathren LaineKevin Steinl, MD 01/22/15 2023

## 2015-01-22 NOTE — ED Notes (Signed)
Pt presents to department for evaluation of L lower dental pain. Ongoing x1 week. 10/10 pain upon arrival to ED.

## 2015-03-07 ENCOUNTER — Emergency Department (HOSPITAL_COMMUNITY)
Admission: EM | Admit: 2015-03-07 | Discharge: 2015-03-07 | Disposition: A | Discharge status: Home / Self Care | Payer: Self-pay | Attending: Emergency Medicine | Admitting: Emergency Medicine

## 2015-03-07 ENCOUNTER — Encounter (HOSPITAL_COMMUNITY): Payer: Self-pay | Admitting: Emergency Medicine

## 2015-03-07 DIAGNOSIS — Z792 Long term (current) use of antibiotics: Secondary | ICD-10-CM | POA: Insufficient documentation

## 2015-03-07 DIAGNOSIS — Z791 Long term (current) use of non-steroidal anti-inflammatories (NSAID): Secondary | ICD-10-CM | POA: Insufficient documentation

## 2015-03-07 DIAGNOSIS — H6123 Impacted cerumen, bilateral: Secondary | ICD-10-CM | POA: Insufficient documentation

## 2015-03-07 DIAGNOSIS — J020 Streptococcal pharyngitis: Secondary | ICD-10-CM | POA: Insufficient documentation

## 2015-03-07 DIAGNOSIS — H9202 Otalgia, left ear: Secondary | ICD-10-CM | POA: Insufficient documentation

## 2015-03-07 DIAGNOSIS — Z720 Tobacco use: Secondary | ICD-10-CM | POA: Insufficient documentation

## 2015-03-07 LAB — RAPID STREP SCREEN (MED CTR MEBANE ONLY): Streptococcus, Group A Screen (Direct): POSITIVE — AB

## 2015-03-07 MED ORDER — HYDROCODONE-ACETAMINOPHEN 7.5-325 MG/15ML PO SOLN
15.0000 mL | Freq: Once | ORAL | Status: AC
  Administered 2015-03-07: 15 mL via ORAL
  Filled 2015-03-07: qty 15

## 2015-03-07 MED ORDER — HYDROCODONE-ACETAMINOPHEN 7.5-325 MG/15ML PO SOLN: 15.0000 mg/kg | Freq: Once | ORAL | Status: DC

## 2015-03-07 MED ORDER — HYDROCODONE-ACETAMINOPHEN 7.5-325 MG/15ML PO SOLN: 10.0000 mL | Freq: Four times a day (QID) | ORAL | Status: DC | PRN

## 2015-03-07 MED ORDER — AMOXICILLIN 500 MG PO CAPS: 500.0000 mg | ORAL_CAPSULE | Freq: Three times a day (TID) | ORAL | Status: DC

## 2015-03-07 NOTE — ED Provider Notes (Signed)
CSN: [id number]     Arrival date & time 03/07/15  1948 History  This chart was scribed for Trixie Dredge, PA-C working with Geoffery Lyons, MD by Evon Slack, ED Scribe. This patient was seen in room TR06C/TR06C and the patient's care was started at 8:10 PM.      Chief Complaint  Patient presents with  . Sore Throat   Patient is a 34 y.o. male presenting with pharyngitis. The history is provided by the patient. No language interpreter was used.  Sore Throat Pertinent negatives include no chest pain, no abdominal pain and no shortness of breath.   HPI Comments: Howard Newman is a 34 y.o. male who presents to the Emergency Department complaining of sore throat onset 1 week. Pt states that the pain is moving from the left and right side of his throat. Pt states that the pain intermittently radiates to his left ear. Pt has tried salt water gargles, throat numbing spray with no relief. Pt states that the pain is worse when swallowing. Pt reports intermittent chills and subjective fever that has resolved. Denies cough, SOB, CP abdominal pain, n/v/d or leg swelling.   Pt denies any recent sick contacts. Denies any difficulty swallowing or breathing.    History reviewed. No pertinent past medical history. History reviewed. No pertinent past surgical history. History reviewed. No pertinent family history. History  Substance Use Topics  . Smoking status: Current Every Day Smoker -- 0.00 packs/day    Types: Cigarettes  . Smokeless tobacco: Not on file  . Alcohol Use: Yes    Review of Systems  Constitutional: Positive for fever.  HENT: Positive for ear pain and sore throat. Negative for trouble swallowing and voice change.   Respiratory: Negative for cough and shortness of breath.   Cardiovascular: Negative for chest pain and leg swelling.  Gastrointestinal: Negative for nausea, vomiting, abdominal pain and diarrhea.  Musculoskeletal: Negative for neck stiffness.  Skin: Negative for color  change.  Allergic/Immunologic: Negative for immunocompromised state.  Neurological: Negative for speech difficulty.  Hematological: Does not bruise/bleed easily.  Psychiatric/Behavioral: Negative for self-injury.     Allergies  Review of patient's allergies indicates no known allergies.  Home Medications   Prior to Admission medications   Medication Sig Start Date End Date Taking? Authorizing Provider  acetaminophen (TYLENOL) 500 MG tablet Take 1,000 mg by mouth every 6 (six) hours as needed for moderate pain.    Historical Provider, MD  amoxicillin (AMOXIL) 500 MG capsule Take 1 capsule (500 mg total) by mouth 3 (three) times daily. Patient not taking: Reported on 12/28/2014 12/06/14   Fayrene Helper, PA-C  doxycycline (VIBRAMYCIN) 100 MG capsule Take 1 capsule (100 mg total) by mouth 2 (two) times daily. One po bid x 7 days Patient not taking: Reported on 11/12/2014 07/10/14   Loren Racer, MD  HYDROcodone-acetaminophen [facility]) 5-325 MG per tablet Take 1 tablet by mouth every 6 (six) hours as needed for moderate pain. Patient not taking: Reported on 11/12/2014 09/14/14   Charm Rings, MD  ibuprofen (ADVIL,MOTRIN) 600 MG tablet Take 1 tablet (600 mg total) by mouth every 6 (six) hours as needed. 01/22/15   Tatyana Kirichenko, PA-C  naproxen (NAPROSYN) 500 MG tablet Take 1 tablet (500 mg total) by mouth 2 (two) times daily with a meal. 01/09/15   Emilia Beck, PA-C  oxyCODONE-acetaminophen (PERCOCET) 5-325 MG per tablet Take 1-2 tablets by mouth every 4 (four) hours as needed. Patient not taking: Reported on 12/28/2014 11/12/14  Arthor CaptainAbigail Harris, PA-C  penicillin v potassium (VEETID) 500 MG tablet Take 1 tablet (500 mg total) by mouth 4 (four) times daily. 01/09/15   Kaitlyn Szekalski, PA-C  traMADol (ULTRAM) 50 MG tablet Take 1 tablet (50 mg total) by mouth every 6 (six) hours as needed. 12/28/14   Cathren LaineKevin Steinl, MD   BP 115/77 mmHg  Pulse 70  Temp(Src) 98.3 F (36.8 C) (Oral)  Resp 14  SpO2  100%   Physical Exam  Constitutional: He appears well-developed and well-nourished. No distress.  HENT:  Head: Normocephalic and atraumatic.  Mouth/Throat: Uvula is midline. Oropharyngeal exudate, posterior oropharyngeal edema and posterior oropharyngeal erythema present. No tonsillar abscesses.  Large amount of cerumen in right and left ear canal.   Eyes: Conjunctivae and EOM are normal. Right eye exhibits no discharge. Left eye exhibits no discharge.  Neck: Normal range of motion. Neck supple.  Cardiovascular: Normal rate and regular rhythm.   Pulmonary/Chest: Effort normal and breath sounds normal. No stridor. No respiratory distress. He has no wheezes. He has no rales.  Lymphadenopathy:    He has no cervical adenopathy.  No lymph adenopathy of head or neck   Neurological: He is alert.  Skin: He is not diaphoretic.  Psychiatric: He has a normal mood and affect. His behavior is normal.  Nursing note and vitals reviewed.   ED Course  Procedures (including critical care time) DIAGNOSTIC STUDIES: Oxygen Saturation is 100% on RA, normal by my interpretation.    COORDINATION OF CARE: 8:19 PM-Discussed treatment plan with pt at bedside and pt agreed to plan.    Labs Review Labs Reviewed  RAPID STREP SCREEN ([facility]) - Abnormal; Notable for the following:    Streptococcus, Group A Screen (Direct) POSITIVE (*)    All other components within normal limits    Imaging Review No results found.   EKG Interpretation None      MDM   Final diagnoses:  Strep throat    Afebrile, nontoxic patient with sore throat x 1 week with subjective fever initially.  Strep screen positive.  No airway concerns.  Tolerating oral secretions, no stridor, no voice change.  No e/o peritonsillar abscess.   D/C home with lortab elixir, amoxicillin.  Discussed result, findings, treatment, and follow up  with patient.  Pt given return precautions.  Pt verbalizes understanding and agrees with plan.        I personally performed the services described in this documentation, which was scribed in my presence. The recorded information has been reviewed and is accurate.      Trixie Dredgemily Romie Keeble, PA-C 03/07/15 2244  Geoffery Lyonsouglas Delo, MD 03/08/15 [phone]

## 2015-03-07 NOTE — Discharge Instructions (Signed)
Read the information below.  Use the prescribed medication as directed.  Please discuss all new medications with your pharmacist.  Do not take additional tylenol while taking the prescribed pain medication to avoid overdose.  You may return to the Emergency Department at any time for worsening condition or any new symptoms that concern you.   If you develop high fevers, difficulty swallowing or breathing, or you are unable to tolerate fluids by mouth, return to the ER immediately for a recheck.    ° ° °Strep Throat °Strep throat is an infection of the throat caused by a bacteria named Streptococcus pyogenes. Your health care provider may call the infection streptococcal "tonsillitis" or "pharyngitis" depending on whether there are signs of inflammation in the tonsils or back of the throat. Strep throat is most common in children aged 5-15 years during the cold months of the year, but it can occur in people of any age during any season. This infection is spread from person to person (contagious) through coughing, sneezing, or other close contact. °SIGNS AND SYMPTOMS  °· Fever or chills. °· Painful, swollen, red tonsils or throat. °· Pain or difficulty when swallowing. °· White or yellow spots on the tonsils or throat. °· Swollen, tender lymph nodes or "glands" of the neck or under the jaw. °· Red rash all over the body (rare). °DIAGNOSIS  °Many different infections can cause the same symptoms. A test must be done to confirm the diagnosis so the right treatment can be given. A "rapid strep test" can help your health care provider make the diagnosis in a few minutes. If this test is not available, a light swab of the infected area can be used for a throat culture test. If a throat culture test is done, results are usually available in a day or two. °TREATMENT  °Strep throat is treated with antibiotic medicine. °HOME CARE INSTRUCTIONS  °· Gargle with 1 tsp of salt in 1 cup of warm water, 3-4 times per day or as needed  for comfort. °· Family members who also have a sore throat or fever should be tested for strep throat and treated with antibiotics if they have the strep infection. °· Make sure everyone in your household washes their hands well. °· Do not share food, drinking cups, or personal items that could cause the infection to spread to others. °· You may need to eat a soft food diet until your sore throat gets better. °· Drink enough water and fluids to keep your urine clear or pale yellow. This will help prevent dehydration. °· Get plenty of rest. °· Stay home from school, day care, or work until you have been on antibiotics for 24 hours. °· Take medicines only as directed by your health care provider. °· Take your antibiotic medicine as directed by your health care provider. Finish it even if you start to feel better. °SEEK MEDICAL CARE IF:  °· The glands in your neck continue to enlarge. °· You develop a rash, cough, or earache. °· You cough up green, yellow-brown, or bloody sputum. °· You have pain or discomfort not controlled by medicines. °· Your problems seem to be getting worse rather than better. °· You have a fever. °SEEK IMMEDIATE MEDICAL CARE IF:  °· You develop any new symptoms such as vomiting, severe headache, stiff or painful neck, chest pain, shortness of breath, or trouble swallowing. °· You develop severe throat pain, drooling, or changes in your voice. °· You develop swelling of the neck,   or the skin on the neck becomes red and tender. °· You develop signs of dehydration, such as fatigue, dry mouth, and decreased urination. °· You become increasingly sleepy, or you cannot wake up completely. °MAKE SURE YOU: °· Understand these instructions. °· Will watch your condition. °· Will get help right away if you are not doing well or get worse. °Document Released: 09/24/2000 Document Revised: 02/11/2014 Document Reviewed: 11/26/2010 °ExitCare® Patient Information ©2015 [employer]. This information is not  intended to replace advice given to you by your health care provider. Make sure you discuss any questions you have with your health care provider. ° °

## 2015-03-07 NOTE — ED Notes (Signed)
Pt. reports sore throat with mild swelling and occasional dry cough onset last week , denies fever or chills, respirations unlabored .

## 2015-07-24 ENCOUNTER — Encounter (HOSPITAL_COMMUNITY): Payer: Self-pay | Admitting: Emergency Medicine

## 2015-07-24 ENCOUNTER — Emergency Department (HOSPITAL_COMMUNITY): Payer: Self-pay

## 2015-07-24 ENCOUNTER — Emergency Department (HOSPITAL_COMMUNITY)
Admission: EM | Admit: 2015-07-24 | Discharge: 2015-07-24 | Disposition: A | Discharge status: Home / Self Care | Payer: Self-pay | Attending: Emergency Medicine | Admitting: Emergency Medicine

## 2015-07-24 DIAGNOSIS — K0889 Other specified disorders of teeth and supporting structures: Secondary | ICD-10-CM | POA: Insufficient documentation

## 2015-07-24 DIAGNOSIS — Z720 Tobacco use: Secondary | ICD-10-CM | POA: Insufficient documentation

## 2015-07-24 DIAGNOSIS — R51 Headache: Secondary | ICD-10-CM | POA: Insufficient documentation

## 2015-07-24 DIAGNOSIS — R519 Headache, unspecified: Secondary | ICD-10-CM

## 2015-07-24 MED ORDER — AMOXICILLIN 500 MG PO CAPS: 500.0000 mg | ORAL_CAPSULE | Freq: Three times a day (TID) | ORAL | Status: DC

## 2015-07-24 NOTE — ED Notes (Signed)
Pt c/o headache "for as long as I can remember" Reports being seen several times for dental issues, however feels pain is most related to a headache. Also c/o photophobia; has taken tylenol without relief

## 2015-07-24 NOTE — Discharge Instructions (Signed)
Dental Pain Dental pain may be caused by many things, including:  Tooth decay (cavities or caries). Cavities expose the nerve of your tooth to air and hot or cold temperatures. This can cause pain or discomfort.  Abscess or infection. A dental abscess is a collection of infected pus from a bacterial infection in the inner part of the tooth (pulp). It usually occurs at the end of the tooth's root.  Injury.  An unknown reason (idiopathic). Your pain may be mild or severe. It may only occur when:  You are chewing.  You are exposed to hot or cold temperature.  You are eating or drinking sugary foods or beverages, such as soda or candy. Your pain may also be constant. HOME CARE INSTRUCTIONS Watch your dental pain for any changes. The following actions may help to lessen any discomfort that you are feeling:  Take medicines only as directed by your dentist.  If you were prescribed an antibiotic medicine, finish all of it even if you start to feel better.  Keep all follow-up visits as directed by your dentist. This is important.  Do not apply heat to the outside of your face.  Rinse your mouth or gargle with salt water if directed by your dentist. This helps with pain and swelling.  You can make salt water by adding  tsp of salt to 1 cup of warm water.  Apply ice to the painful area of your face:  Put ice in a plastic bag.  Place a towel between your skin and the bag.  Leave the ice on for 20 minutes, 2-3 times per day.  Avoid foods or drinks that cause you pain, such as:  Very hot or very cold foods or drinks.  Sweet or sugary foods or drinks. SEEK MEDICAL CARE IF:  Your pain is not controlled with medicines.  Your symptoms are worse.  You have new symptoms. SEEK IMMEDIATE MEDICAL CARE IF:  You are unable to open your mouth.  You are having trouble breathing or swallowing.  You have a fever.  Your face, neck, or jaw is swollen.   This information is not  intended to replace advice given to you by your health care provider. Make sure you discuss any questions you have with your health care provider.   Document Released: 09/27/2005 Document Revised: 02/11/2015 Document Reviewed: 09/23/2014 Elsevier Interactive Patient Education 2016 [employer] General Headache Without Cause A headache is pain or discomfort felt around the head or neck area. There are many causes and types of headaches. In some cases, the cause may not be found.  HOME CARE  Managing Pain  Take over-the-counter and prescription medicines only as told by your doctor.  Lie down in a dark, quiet room when you have a headache.  If directed, apply ice to the head and neck area:  Put ice in a plastic bag.  Place a towel between your skin and the bag.  Leave the ice on for 20 minutes, 2-3 times per day.  Use a heating pad or hot shower to apply heat to the head and neck area as told by your doctor.  Keep lights dim if bright lights bother you or make your headaches worse. Eating and Drinking  Eat meals on a regular schedule.  Lessen how much alcohol you drink.  Lessen how much caffeine you drink, or stop drinking caffeine. General Instructions  Keep all follow-up visits as told by your doctor. This is important.  Keep a journal to find  out if certain things bring on headaches. For example, write down:  What you eat and drink.  How much sleep you get.  Any change to your diet or medicines.  Relax by getting a massage or doing other relaxing activities.  Lessen stress.  Sit up straight. Do not tighten (tense) your muscles.  Do not use tobacco products. This includes cigarettes, chewing tobacco, or e-cigarettes. If you need help quitting, ask your doctor.  Exercise regularly as told by your doctor.  Get enough sleep. This often means 7-9 hours of sleep. GET HELP IF:  Your symptoms are not helped by medicine.  You have a headache that feels different  than the other headaches.  You feel sick to your stomach (nauseous) or you throw up (vomit).  You have a fever. GET HELP RIGHT AWAY IF:   Your headache becomes really bad.  You keep throwing up.  You have a stiff neck.  You have trouble seeing.  You have trouble speaking.  You have pain in the eye or ear.  Your muscles are weak or you lose muscle control.  You lose your balance or have trouble walking.  You feel like you will pass out (faint) or you pass out.  You have confusion.   This information is not intended to replace advice given to you by your health care provider. Make sure you discuss any questions you have with your health care provider.   Document Released: 07/06/2008 Document Revised: 06/18/2015 Document Reviewed: 01/20/2015 Elsevier Interactive Patient Education [employer]

## 2015-07-24 NOTE — ED Notes (Signed)
Pt c/o R dental abscess without fever. Pt also c.o headache that has been ongoing for even longer. Denies numbness/weakness.

## 2015-07-24 NOTE — ED Notes (Signed)
Pt requesting to leave AMA; reports wanting to come back and have CT completed. Informed pt that he would have to start over. Informed provider of situation. Rob at bedside

## 2015-07-24 NOTE — ED Provider Notes (Signed)
CSN: [id number]     Arrival date & time 07/24/15  1832 History  By signing my name below, I, Howard Newman, attest that this documentation has been prepared under the direction and in the presence of Roxy Horseman, PA-C. Electronically Signed: Tanda Newman, ED Scribe. 07/24/2015. 8:19 PM.  Chief Complaint  Patient presents with  . Dental Pain  . Headache   The history is provided by the patient. No language interpreter was used.     HPI Comments: Howard Newman is a 34 y.o. male who presents to the Emergency Department complaining of gradual onset, constant, severe, diffuse headache x "awhile." Pt is also complaining of chronic right lower dental pain that began prior to the headache. He has been taking Tylenol without relief. Denies numbness or weakness in extremities, visual changes, or any other associated symptoms. No hx migraines.    History reviewed. No pertinent past medical history. History reviewed. No pertinent past surgical history. No family history on file. Social History  Substance Use Topics  . Smoking status: Current Every Day Smoker -- 0.00 packs/day    Types: Cigarettes  . Smokeless tobacco: None  . Alcohol Use: Yes    Review of Systems  Eyes: Negative for visual disturbance.  Neurological: Positive for headaches. Negative for weakness and numbness.  All other systems reviewed and are negative.  Allergies  Review of patient's allergies indicates no known allergies.  Home Medications   Prior to Admission medications   Medication Sig Start Date End Date Taking? Authorizing Provider  acetaminophen (TYLENOL) 500 MG tablet Take 1,000 mg by mouth every 6 (six) hours as needed for moderate pain.    Historical Provider, MD  amoxicillin (AMOXIL) 500 MG capsule Take 1 capsule (500 mg total) by mouth 3 (three) times daily. X 10 days 03/07/15   Trixie Dredge, PA-C  doxycycline (VIBRAMYCIN) 100 MG capsule Take 1 capsule (100 mg total) by mouth 2 (two) times daily.  One po bid x 7 days Patient not taking: Reported on 11/12/2014 07/10/14   Loren Racer, MD  HYDROcodone-acetaminophen (HYCET) 7.5-325 mg/15 ml solution Take 10 mLs by mouth 4 (four) times daily as needed for moderate pain or severe pain. 03/07/15   Trixie Dredge, PA-C  ibuprofen (ADVIL,MOTRIN) 600 MG tablet Take 1 tablet (600 mg total) by mouth every 6 (six) hours as needed. 01/22/15   Tatyana Kirichenko, PA-C  naproxen (NAPROSYN) 500 MG tablet Take 1 tablet (500 mg total) by mouth 2 (two) times daily with a meal. 01/09/15   Emilia Beck, PA-C  oxyCODONE-acetaminophen (PERCOCET) 5-325 MG per tablet Take 1-2 tablets by mouth every 4 (four) hours as needed. Patient not taking: Reported on 12/28/2014 11/12/14   Arthor Captain, PA-C  traMADol (ULTRAM) 50 MG tablet Take 1 tablet (50 mg total) by mouth every 6 (six) hours as needed. 12/28/14   Cathren Laine, MD   Triage VItals: BP 112/64 mmHg  Pulse 66  Temp(Src) 98.5 F (36.9 C) (Oral)  Resp 18  Ht  (1.803 m)  Wt 172 lb (78.019 kg)  BMI 24.00 kg/m2  SpO2 100%   Physical Exam  Constitutional: He is oriented to person, place, and time. He appears well-developed and well-nourished. No distress.  HENT:  Head: Normocephalic and atraumatic.  Right Ear: External ear normal.  Left Ear: External ear normal.  Mouth/Throat:    Poor dentition throughout.  Affected tooth as diagrammed.  No signs of peritonsillar or tonsillar abscess.  No signs of gingival abscess. Oropharynx is clear  and without exudates.  Uvula is midline.  Airway is intact. No signs of Ludwig's angina with palpation of oral and sublingual mucosa.   Eyes: Conjunctivae and EOM are normal. Pupils are equal, round, and reactive to light.  Neck: Normal range of motion. Neck supple. No tracheal deviation present.  No pain with neck flexion, no meningismus  Cardiovascular: Normal rate, regular rhythm and normal heart sounds.  Exam reveals no gallop and no friction rub.   No murmur  heard. Pulmonary/Chest: Effort normal and breath sounds normal. No respiratory distress. He has no wheezes. He has no rales. He exhibits no tenderness.  Abdominal: Soft. He exhibits no distension and no mass. There is no tenderness. There is no rebound and no guarding.  Musculoskeletal: Normal range of motion. He exhibits no edema or tenderness.  Normal gait.  Neurological: He is alert and oriented to person, place, and time. He has normal reflexes.  CN 3-12 intact, normal finger to nose, no pronator drift, sensation and strength intact bilaterally.  Skin: Skin is warm and dry.  Psychiatric: He has a normal mood and affect. His behavior is normal. Judgment and thought content normal.  Nursing note and vitals reviewed.   ED Course  Procedures (including critical care time)  DIAGNOSTIC STUDIES: Oxygen Saturation is 100% on RA, normal by my interpretation.    COORDINATION OF CARE: 8:17 PM-Discussed treatment plan which includes CT Head with pt at bedside and pt agreed to plan.    Imaging Review Ct Head Wo Contrast  07/24/2015  CLINICAL DATA:  Recent mouth surgery. Pain in the temples and tender spot on the top of the head to the left. Constant headaches for months. EXAM: CT HEAD WITHOUT CONTRAST TECHNIQUE: Contiguous axial images were obtained from the base of the skull through the vertex without intravenous contrast. COMPARISON:  10/27/2008 FINDINGS: Ventricles and sulci appear symmetrical. No mass effect or midline shift. No abnormal extra-axial fluid collections. Gray-white matter junctions are distinct. Basal cisterns are not effaced. No evidence of acute intracranial hemorrhage. No depressed skull fractures. Visualized paranasal sinuses and mastoid air cells are not opacified. IMPRESSION: No acute intracranial abnormalities. Electronically Signed   By: Burman NievesWilliam  Stevens M.D.   On: 07/24/2015 21:37   I have personally reviewed and evaluated these images as part of my medical  decision-making. \  MDM   Final diagnoses:  Pain, dental  Headache, unspecified headache type   Patient with headache and dental pain.  HA has been persistent for several weeks. Presentation is like pts typical HA and non concerning for [facility], ICH, Meningitis, or temporal arteritis.  CT is negative.  Will recommend neurology follow-up. Pt is afebrile with no focal neuro deficits, nuchal rigidity, or change in vision. Pt is to follow up with PCP to discuss prophylactic medication. Pt verbalizes understanding and is agreeable with plan to dc.   Patient with toothache.  No gross abscess.  Exam unconcerning for Ludwig's angina or spread of infection.  Will treat with penicillin and pain medicine.  Urged patient to follow-up with dentist.        Roxy Horsemanobert Braylan Faul, PA-C 07/24/15 2250  Eber HongBrian Miller, MD 07/25/15 [id number]

## 2015-11-03 ENCOUNTER — Emergency Department (HOSPITAL_COMMUNITY): Payer: Self-pay

## 2015-11-03 ENCOUNTER — Emergency Department (HOSPITAL_COMMUNITY)
Admission: EM | Admit: 2015-11-03 | Discharge: 2015-11-03 | Disposition: A | Discharge status: Home / Self Care | Payer: Self-pay | Attending: Emergency Medicine | Admitting: Emergency Medicine

## 2015-11-03 ENCOUNTER — Encounter (HOSPITAL_COMMUNITY): Payer: Self-pay

## 2015-11-03 ENCOUNTER — Other Ambulatory Visit: Payer: Self-pay

## 2015-11-03 DIAGNOSIS — R0789 Other chest pain: Secondary | ICD-10-CM

## 2015-11-03 DIAGNOSIS — K0889 Other specified disorders of teeth and supporting structures: Secondary | ICD-10-CM | POA: Insufficient documentation

## 2015-11-03 DIAGNOSIS — Z79899 Other long term (current) drug therapy: Secondary | ICD-10-CM | POA: Insufficient documentation

## 2015-11-03 DIAGNOSIS — R079 Chest pain, unspecified: Secondary | ICD-10-CM | POA: Insufficient documentation

## 2015-11-03 DIAGNOSIS — F17210 Nicotine dependence, cigarettes, uncomplicated: Secondary | ICD-10-CM | POA: Insufficient documentation

## 2015-11-03 LAB — CBC
HEMATOCRIT: 40.9 % (ref 39.0–52.0)
HEMOGLOBIN: 13.6 g/dL (ref 13.0–17.0)
MCH: 30.1 pg (ref 26.0–34.0)
MCHC: 33.3 g/dL (ref 30.0–36.0)
MCV: 90.5 fL (ref 78.0–100.0)
Platelets: 255 10*3/uL (ref 150–400)
RBC: 4.52 MIL/uL (ref 4.22–5.81)
RDW: 12.8 % (ref 11.5–15.5)
WBC: 5.7 10*3/uL (ref 4.0–10.5)

## 2015-11-03 LAB — I-STAT TROPONIN, ED
Troponin i, poc: 0 ng/mL (ref 0.00–0.08)
Troponin i, poc: 0 ng/mL (ref 0.00–0.08)

## 2015-11-03 LAB — BASIC METABOLIC PANEL
Anion gap: 11 (ref 5–15)
BUN: 9 mg/dL (ref 6–20)
CHLORIDE: 101 mmol/L (ref 101–111)
CO2: 28 mmol/L (ref 22–32)
Calcium: 9.3 mg/dL (ref 8.9–10.3)
Creatinine, Ser: 1.07 mg/dL (ref 0.61–1.24)
GFR calc Af Amer: >60 mL/min (ref >60)
GFR calc non Af Amer: >60 mL/min (ref >60)
GLUCOSE: 97 mg/dL (ref 65–99)
POTASSIUM: 4.3 mmol/L (ref 3.5–5.1)
Sodium: 140 mmol/L (ref 135–145)

## 2015-11-03 MED ORDER — KETOROLAC TROMETHAMINE 60 MG/2ML IM SOLN
60.0000 mg | Freq: Once | INTRAMUSCULAR | Status: AC
  Administered 2015-11-03: 60 mg via INTRAMUSCULAR
  Filled 2015-11-03: qty 2

## 2015-11-03 MED ORDER — NAPROXEN 500 MG PO TABS: 500.0000 mg | ORAL_TABLET | Freq: Two times a day (BID) | ORAL | Status: DC

## 2015-11-03 MED ORDER — PENICILLIN V POTASSIUM 500 MG PO TABS: 500.0000 mg | ORAL_TABLET | Freq: Four times a day (QID) | ORAL | Status: AC

## 2015-11-03 NOTE — ED Provider Notes (Signed)
CSN: [id number]     Arrival date & time 11/03/15  7741 History   First MD Initiated Contact with Patient 11/03/15 1019     Chief Complaint  Patient presents with  . Chest Pain  . Dental Pain     (Consider location/radiation/quality/duration/timing/severity/associated sxs/prior Treatment) HPI Howard Newman is a 35 y.o. male presents with 2 different complaints. Patient reports intermittent left-sided chest pain for 2 weeks. States comes and goes independent of activity. Pain is sharp when it comes. Last several minutes, and resolves on its own. Denies associated shortness of breath, dizziness, diaphoresis. States he does have to lift a lot of heavy objects at work but denies any pain when lifting or moving his arm. He denies any swelling in extremities. No pain in his calves. Denies any recent travel or surgeries. Denies any cough. Patient is a smoker. Patient also is complaining of right lower first lower tooth pain. Patient states that he has had issues with that tooth for several weeks and is getting worse. He does not have a dentist. Denies any facial swelling, swelling under the tongue, difficulty swallowing, fever or chills  History reviewed. No pertinent past medical history. History reviewed. No pertinent past surgical history. No family history on file. Social History  Substance Use Topics  . Smoking status: Current Every Day Smoker -- 0.00 packs/day    Types: Cigarettes  . Smokeless tobacco: None  . Alcohol Use: Yes    Review of Systems  Constitutional: Negative for fever and chills.  HENT: Positive for dental problem.   Respiratory: Positive for chest tightness. Negative for cough and shortness of breath.   Cardiovascular: Positive for chest pain. Negative for palpitations and leg swelling.  Gastrointestinal: Negative for nausea, vomiting, abdominal pain, diarrhea and abdominal distention.  Genitourinary: Negative for dysuria, urgency, frequency and hematuria.   Musculoskeletal: Negative for myalgias, arthralgias, neck pain and neck stiffness.  Skin: Negative for rash.  Allergic/Immunologic: Negative for immunocompromised state.  Neurological: Negative for dizziness, weakness, light-headedness, numbness and headaches.  All other systems reviewed and are negative.     Allergies  Review of patient's allergies indicates no known allergies.  Home Medications   Prior to Admission medications   Medication Sig Start Date End Date Taking? Authorizing Provider  acetaminophen (TYLENOL) 500 MG tablet Take 1,000 mg by mouth daily as needed for moderate pain.    Yes Historical Provider, MD  ibuprofen (ADVIL,MOTRIN) 200 MG tablet Take 400 mg by mouth daily as needed for moderate pain.   Yes Historical Provider, MD  Multiple Vitamin (MULTIVITAMIN) tablet Take 1 tablet by mouth daily.   Yes Historical Provider, MD  amoxicillin (AMOXIL) 500 MG capsule Take 1 capsule (500 mg total) by mouth 3 (three) times daily. Patient not taking: Reported on 11/03/2015 07/24/15   Roxy Horseman, PA-C  doxycycline (VIBRAMYCIN) 100 MG capsule Take 1 capsule (100 mg total) by mouth 2 (two) times daily. One po bid x 7 days Patient not taking: Reported on 11/12/2014 07/10/14   Loren Racer, MD  HYDROcodone-acetaminophen (HYCET) 7.5-325 mg/15 ml solution Take 10 mLs by mouth 4 (four) times daily as needed for moderate pain or severe pain. Patient not taking: Reported on 11/03/2015 03/07/15   Trixie Dredge, PA-C  ibuprofen (ADVIL,MOTRIN) 600 MG tablet Take 1 tablet (600 mg total) by mouth every 6 (six) hours as needed. Patient not taking: Reported on 11/03/2015 01/22/15   Legend Tumminello, PA-C  naproxen (NAPROSYN) 500 MG tablet Take 1 tablet (500 mg total) by  mouth 2 (two) times daily with a meal. Patient not taking: Reported on 11/03/2015 01/09/15   Emilia Beck, PA-C  oxyCODONE-acetaminophen (PERCOCET) 5-325 MG per tablet Take 1-2 tablets by mouth every 4 (four) hours as  needed. Patient not taking: Reported on 11/03/2015 11/12/14   Arthor Captain, PA-C  traMADol (ULTRAM) 50 MG tablet Take 1 tablet (50 mg total) by mouth every 6 (six) hours as needed. Patient not taking: Reported on 11/03/2015 12/28/14   Cathren Laine, MD   BP 109/72 mmHg  Pulse 72  Resp 17  SpO2 96% Physical Exam  Constitutional: He is oriented to person, place, and time. He appears well-developed and well-nourished. No distress.  HENT:  Head: Normocephalic and atraumatic.  Eyes: Conjunctivae are normal.  Neck: Neck supple.  Cardiovascular: Normal rate, regular rhythm and normal heart sounds.   Pulmonary/Chest: Effort normal. No respiratory distress. He has no wheezes. He has no rales. He exhibits no tenderness.  Abdominal: Soft. Bowel sounds are normal. He exhibits no distension. There is no tenderness. There is no rebound.  Musculoskeletal: He exhibits no edema.  Neurological: He is alert and oriented to person, place, and time.  Skin: Skin is warm and dry.  Nursing note and vitals reviewed.   ED Course  Procedures (including critical care time) Labs Review Labs Reviewed  BASIC METABOLIC PANEL  CBC  I-STAT TROPOININ, ED  Rosezena Sensor, ED    Imaging Review Dg Chest 2 View  11/03/2015  CLINICAL DATA:  Chest pain radiating to the teeth for 2 months. Dizziness. EXAM: CHEST  2 VIEW COMPARISON:  09/24/2007 FINDINGS: Heart and mediastinal contours are within normal limits. No focal opacities or effusions. No acute bony abnormality. IMPRESSION: No active cardiopulmonary disease. Electronically Signed   By: Charlett Nose M.D.   On: 11/03/2015 09:42   I have personally reviewed and evaluated these images and lab results as part of my medical decision-making.   EKG Interpretation   Date/Time:  Monday November 03 2015 09:19:15 EST Ventricular Rate:  80 PR Interval:  160 QRS Duration: 110 QT Interval:  378 QTC Calculation: 435 R Axis:   -14 Text Interpretation:  Normal sinus rhythm  Incomplete right bundle branch  block Cannot rule out Inferior infarct , age undetermined Abnormal ECG  Sinus rhythm Early repolarization pattern Left ventricular hypertrophy  Abnormal ekg Confirmed by Gerhard Munch  MD [phone]) on 11/03/2015 9:28:00  AM      MDM   Final diagnoses:  Atypical chest pain  Pain, dental    Patient with intermittent chest pain for 2 weeks, pain is atypical, no pain at present. Pain is nonexertional. No associated symptoms. Patient is also complaining of dental pain. No obvious abscess or infection noted. Patient with poor dentition. Will check labs, chest x-ray, Moninor.   2:15 PM Deltal trop negative. Labs normal. VS remain normal. Doubt ACS. PERC negative, doubt PE. Plan to dc home. Will treat with penicillin for possible early abscess with dentist follow up. Return precautions discussed.   Filed Vitals:   11/03/15 1300 11/03/15 1315 11/03/15 1330 11/03/15 1345  BP: 105/65 128/81 102/55 109/65  Pulse: 65 61 64 69  Resp: SpO2: 98% 100% 97% 99%      Jaynie Crumble, PA-C 11/03/15 2000  Margarita Grizzle, MD 11/05/15 1253

## 2015-11-03 NOTE — Discharge Instructions (Signed)
Naprosyn for pain and inflammation. Penicillin for dental infection. Follow up with primary care doctor and with dentist.   Nonspecific Chest Pain  Chest pain can be caused by many different conditions. There is always a chance that your pain could be related to something serious, such as a heart attack or a blood clot in your lungs. Chest pain can also be caused by conditions that are not life-threatening. If you have chest pain, it is very important to follow up with your health care provider. CAUSES  Chest pain can be caused by:  Heartburn.  Pneumonia or bronchitis.  Anxiety or stress.  Inflammation around your heart (pericarditis) or lung (pleuritis or pleurisy).  A blood clot in your lung.  A collapsed lung (pneumothorax). It can develop suddenly on its own (spontaneous pneumothorax) or from trauma to the chest.  Shingles infection (varicella-zoster virus).  Heart attack.  Damage to the bones, muscles, and cartilage that make up your chest wall. This can include:  Bruised bones due to injury.  Strained muscles or cartilage due to frequent or repeated coughing or overwork.  Fracture to one or more ribs.  Sore cartilage due to inflammation (costochondritis). RISK FACTORS  Risk factors for chest pain may include:  Activities that increase your risk for trauma or injury to your chest.  Respiratory infections or conditions that cause frequent coughing.  Medical conditions or overeating that can cause heartburn.  Heart disease or family history of heart disease.  Conditions or health behaviors that increase your risk of developing a blood clot.  Having had chicken pox (varicella zoster). SIGNS AND SYMPTOMS Chest pain can feel like:  Burning or tingling on the surface of your chest or deep in your chest.  Crushing, pressure, aching, or squeezing pain.  Dull or sharp pain that is worse when you move, cough, or take a deep breath.  Pain that is also felt in your  back, neck, shoulder, or arm, or pain that spreads to any of these areas. Your chest pain may come and go, or it may stay constant. DIAGNOSIS Lab tests or other studies may be needed to find the cause of your pain. Your health care provider may have you take a test called an ambulatory ECG (electrocardiogram). An ECG records your heartbeat patterns at the time the test is performed. You may also have other tests, such as:  Transthoracic echocardiogram (TTE). During echocardiography, sound waves are used to create a picture of all of the heart structures and to look at how blood flows through your heart.  Transesophageal echocardiogram (TEE).This is a more advanced imaging test that obtains images from inside your body. It allows your health care provider to see your heart in finer detail.  Cardiac monitoring. This allows your health care provider to monitor your heart rate and rhythm in real time.  Holter monitor. This is a portable device that records your heartbeat and can help to diagnose abnormal heartbeats. It allows your health care provider to track your heart activity for several days, if needed.  Stress tests. These can be done through exercise or by taking medicine that makes your heart beat more quickly.  Blood tests.  Imaging tests. TREATMENT  Your treatment depends on what is causing your chest pain. Treatment may include:  Medicines. These may include:  Acid blockers for heartburn.  Anti-inflammatory medicine.  Pain medicine for inflammatory conditions.  Antibiotic medicine, if an infection is present.  Medicines to dissolve blood clots.  Medicines to treat coronary  artery disease.  Supportive care for conditions that do not require medicines. This may include:  Resting.  Applying heat or cold packs to injured areas.  Limiting activities until pain decreases. HOME CARE INSTRUCTIONS  If you were prescribed an antibiotic medicine, finish it all even if you  start to feel better.  Avoid any activities that bring on chest pain.  Do not use any tobacco products, including cigarettes, chewing tobacco, or electronic cigarettes. If you need help quitting, ask your health care provider.  Do not drink alcohol.  Take medicines only as directed by your health care provider.  Keep all follow-up visits as directed by your health care provider. This is important. This includes any further testing if your chest pain does not go away.  If heartburn is the cause for your chest pain, you may be told to keep your head raised (elevated) while sleeping. This reduces the chance that acid will go from your stomach into your esophagus.  Make lifestyle changes as directed by your health care provider. These may include:  Getting regular exercise. Ask your health care provider to suggest some activities that are safe for you.  Eating a heart-healthy diet. A registered dietitian can help you to learn healthy eating options.  Maintaining a healthy weight.  Managing diabetes, if necessary.  Reducing stress. SEEK MEDICAL CARE IF:  Your chest pain does not go away after treatment.  You have a rash with blisters on your chest.  You have a fever. SEEK IMMEDIATE MEDICAL CARE IF:   Your chest pain is worse.  You have an increasing cough, or you cough up blood.  You have severe abdominal pain.  You have severe weakness.  You faint.  You have chills.  You have sudden, unexplained chest discomfort.  You have sudden, unexplained discomfort in your arms, back, neck, or jaw.  You have shortness of breath at any time.  You suddenly start to sweat, or your skin gets clammy.  You feel nauseous or you vomit.  You suddenly feel light-headed or dizzy.  Your heart begins to beat quickly, or it feels like it is skipping beats. These symptoms may represent a serious problem that is an emergency. Do not wait to see if the symptoms will go away. Get medical  help right away. Call your local emergency services (911 in the [place] Do not drive yourself to the hospital.   This information is not intended to replace advice given to you by your health care provider. Make sure you discuss any questions you have with your health care provider.   Document Released: 07/07/2005 Document Revised: 10/18/2014 Document Reviewed: 05/03/2014 Elsevier Interactive Patient Education 2016 [employer] Inc[employer]  Dental Pain Dental pain may be caused by many things, including:  Tooth decay (cavities or caries). Cavities expose the nerve of your tooth to air and hot or cold temperatures. This can cause pain or discomfort.  Abscess or infection. A dental abscess is a collection of infected pus from a bacterial infection in the inner part of the tooth (pulp). It usually occurs at the end of the tooth's root.  Injury.  An unknown reason (idiopathic). Your pain may be mild or severe. It may only occur when:  You are chewing.  You are exposed to hot or cold temperature.  You are eating or drinking sugary foods or beverages, such as soda or candy. Your pain may also be constant. HOME CARE INSTRUCTIONS Watch your dental pain for any changes. The following actions may  help to lessen any discomfort that you are feeling:  Take medicines only as directed by your dentist.  If you were prescribed an antibiotic medicine, finish all of it even if you start to feel better.  Keep all follow-up visits as directed by your dentist. This is important.  Do not apply heat to the outside of your face.  Rinse your mouth or gargle with salt water if directed by your dentist. This helps with pain and swelling.  You can make salt water by adding  tsp of salt to 1 cup of warm water.  Apply ice to the painful area of your face:  Put ice in a plastic bag.  Place a towel between your skin and the bag.  Leave the ice on for 20 minutes, 2-3 times per day.  Avoid foods or drinks that  cause you pain, such as:  Very hot or very cold foods or drinks.  Sweet or sugary foods or drinks. SEEK MEDICAL CARE IF:  Your pain is not controlled with medicines.  Your symptoms are worse.  You have new symptoms. SEEK IMMEDIATE MEDICAL CARE IF:  You are unable to open your mouth.  You are having trouble breathing or swallowing.  You have a fever.  Your face, neck, or jaw is swollen.   This information is not intended to replace advice given to you by your health care provider. Make sure you discuss any questions you have with your health care provider.   Document Released: 09/27/2005 Document Revised: 02/11/2015 Document Reviewed: 09/23/2014 Elsevier Interactive Patient Education [employer]

## 2015-11-03 NOTE — ED Notes (Signed)
Patient here with intermittent chest pain x 2 weeks, describes as intermittent, does a lot of furniture moving daily. Also wants abscess on right lower gum checked as well

## 2016-04-06 ENCOUNTER — Encounter (HOSPITAL_COMMUNITY): Payer: Self-pay

## 2016-04-06 ENCOUNTER — Emergency Department (HOSPITAL_COMMUNITY)
Admission: EM | Admit: 2016-04-06 | Discharge: 2016-04-06 | Disposition: A | Discharge status: Home / Self Care | Payer: No Typology Code available for payment source | Attending: Emergency Medicine | Admitting: Emergency Medicine

## 2016-04-06 DIAGNOSIS — L0291 Cutaneous abscess, unspecified: Secondary | ICD-10-CM

## 2016-04-06 DIAGNOSIS — F17210 Nicotine dependence, cigarettes, uncomplicated: Secondary | ICD-10-CM | POA: Insufficient documentation

## 2016-04-06 DIAGNOSIS — Z79899 Other long term (current) drug therapy: Secondary | ICD-10-CM | POA: Insufficient documentation

## 2016-04-06 DIAGNOSIS — L0231 Cutaneous abscess of buttock: Secondary | ICD-10-CM | POA: Insufficient documentation

## 2016-04-06 NOTE — ED Notes (Addendum)
Patient comp;ains of abscess to left buttock for greater than 1 week. Draining x 2 days. Patient also complains of intermittent abscess to gum

## 2016-04-06 NOTE — ED Notes (Signed)
Pt states has "sore" on buttock that drained and is not "a hole". Also c/o gum pain which also drained.

## 2016-04-06 NOTE — Discharge Instructions (Signed)

## 2016-04-06 NOTE — ED Notes (Signed)
"  This is not going to help..I'm going to find another doctor".

## 2016-04-06 NOTE — ED Provider Notes (Signed)
CSN: [id number]     Arrival date & time 04/06/16  [id number] History  By signing my name below, I, Howard Newman, attest that this documentation has been prepared under the direction and in the presence of Howard RubbermaidJeffrey Howard Tamm, PA-C. Electronically Signed: Ronney LionSuzanne Newman, ED Scribe. 04/06/2016. 10:45 AM.    Chief Complaint  Patient presents with  . abscess/buttock    The history is provided by the patient. No language interpreter was used.    HPI Comments: Howard Newman is a 35 y.o. male who presents to the Emergency Department complaining of a recurrent, gradual-onset, constant, localized area of pain, swelling, and redness to his left buttock that began over 1 week ago. He reports the area had spontaneously popped and drained 2 days ago. Patient states this is a recurrent problem, as he has had an abscess in the same area in the past that required incision and drainage. He denies fever or chills.  Patient also complains of intermittent pain and swelling to the gingival area surrounding his central incisors, occurring nightly for "a while." Patient states he has not seen his dentist for this problem. No treatments or modifying factors were noted.  History reviewed. No pertinent past medical history. History reviewed. No pertinent past surgical history. No family history on file. Social History  Substance Use Topics  . Smoking status: Current Every Day Smoker -- 0.00 packs/day    Types: Cigarettes  . Smokeless tobacco: None  . Alcohol Use: Yes    Review of Systems  Constitutional: Negative for fever and chills.  HENT: Positive for dental problem.   Skin: Positive for color change (localized area of pain, redness, and swelling to left buttock).    Allergies  Review of patient's allergies indicates no known allergies.  Home Medications   Prior to Admission medications   Medication Sig Start Date End Date Taking? Authorizing Provider  acetaminophen (TYLENOL) 500 MG tablet Take 1,000 mg by mouth  daily as needed for moderate pain.     Historical Provider, MD  amoxicillin (AMOXIL) 500 MG capsule Take 1 capsule (500 mg total) by mouth 3 (three) times daily. Patient not taking: Reported on 11/03/2015 07/24/15   Howard Horsemanobert Browning, PA-C  doxycycline (VIBRAMYCIN) 100 MG capsule Take 1 capsule (100 mg total) by mouth 2 (two) times daily. One po bid x 7 days Patient not taking: Reported on 11/12/2014 07/10/14   Howard Raceravid Yelverton, MD  HYDROcodone-acetaminophen (HYCET) 7.5-325 mg/15 ml solution Take 10 mLs by mouth 4 (four) times daily as needed for moderate pain or severe pain. Patient not taking: Reported on 11/03/2015 03/07/15   Howard DredgeEmily West, PA-C  ibuprofen (ADVIL,MOTRIN) 200 MG tablet Take 400 mg by mouth daily as needed for moderate pain.    Historical Provider, MD  ibuprofen (ADVIL,MOTRIN) 600 MG tablet Take 1 tablet (600 mg total) by mouth every 6 (six) hours as needed. Patient not taking: Reported on 11/03/2015 01/22/15   Howard Crumbleatyana Kirichenko, PA-C  Multiple Vitamin (MULTIVITAMIN) tablet Take 1 tablet by mouth daily.    Historical Provider, MD  naproxen (NAPROSYN) 500 MG tablet Take 1 tablet (500 mg total) by mouth 2 (two) times daily. 11/03/15   Howard Kirichenko, PA-C  oxyCODONE-acetaminophen (PERCOCET) 5-325 MG per tablet Take 1-2 tablets by mouth every 4 (four) hours as needed. Patient not taking: Reported on 11/03/2015 11/12/14   Howard Newman Harris, PA-C  traMADol (ULTRAM) 50 MG tablet Take 1 tablet (50 mg total) by mouth every 6 (six) hours as needed. Patient not taking: Reported on  11/03/2015 12/28/14   Howard LaineKevin Steinl, MD   BP 124/85 mmHg  Pulse 78  Temp(Src) 97.5 F (36.4 C) (Oral)  Resp 20  SpO2 98% Physical Exam  Constitutional: He is oriented to person, place, and time. He appears well-developed and well-nourished. No distress.  HENT:  Head: Normocephalic and atraumatic.  Mouth/Throat: Uvula is midline, oropharynx is clear and moist and mucous membranes are normal. No oropharyngeal exudate,  posterior oropharyngeal edema, posterior oropharyngeal erythema or tonsillar abscesses.  No abnormalities palpated along the gumline, surgically absent teeth on the maxilla no infectious etiology  Eyes: Conjunctivae are normal. Pupils are equal, round, and reactive to light. Right eye exhibits no discharge. Left eye exhibits no discharge. No scleral icterus.  Neck: Normal range of motion. Neck supple. No JVD present. No tracheal deviation present. No thyromegaly present.  Pulmonary/Chest: Effort normal. No stridor.  Musculoskeletal:  Left buttocks shows area of previous I&D, no signs of cellulitis, no fluctuance, well-healed wound. No discharge noted, no warmth to touch  Lymphadenopathy:    He has no cervical adenopathy.  Neurological: He is alert and oriented to person, place, and time. Coordination normal.  Skin: Skin is warm and dry. No rash noted. He is not diaphoretic. No erythema. No pallor.  Psychiatric: He has a normal mood and affect. His behavior is normal. Judgment and thought content normal.  Nursing note and vitals reviewed.   ED Course  Procedures (including critical care time)  DIAGNOSTIC STUDIES: Oxygen Saturation is 98% on RA, normal by my interpretation.    COORDINATION OF CARE: 10:25 AM - Discussed treatment plan with pt at bedside which includes warm compresses. Advised pt to return to the ED for incision and drainage if his pain and swelling continue to worsen despite treatment. Also advised pt to brush his teeth and follow up with his dentist for his intermittent dental pain and swelling. Pt verbalized understanding and agreed to plan.   MDM   Final diagnoses:  Abscess   Labs:  Imaging:  Consults:  Therapeutics:  Discharge Meds:   Assessment/Plan:  Patient with Susette RacerHardy rupture skin abscess with no signs of fluctuance or infection on today's exam. Encouraged home warm soaks and flushing. No signs of cellulitis.  Will d/c to home.  No antibiotic therapy is  indicated.  Patient also presents with dentalgia.  No abscess requiring immediate incision and drainage.  Exam not concerning for Ludwig's angina or pharyngeal abscess.  Pt instructed to follow-up with dentist.  Discussed return precautions. Pt safe for discharge.   I personally performed the services described in this documentation, which was scribed in my presence. The recorded information has been reviewed and is accurate.     Eyvonne MechanicJeffrey Julita Ozbun, PA-C 04/06/16 1214  Doug SouSam Jacubowitz, MD 04/06/16 [phone]

## 2016-08-08 ENCOUNTER — Emergency Department (HOSPITAL_COMMUNITY)
Admission: EM | Admit: 2016-08-08 | Discharge: 2016-08-08 | Disposition: A | Discharge status: Home / Self Care | Payer: Self-pay | Attending: Emergency Medicine | Admitting: Emergency Medicine

## 2016-08-08 ENCOUNTER — Encounter (HOSPITAL_COMMUNITY): Payer: Self-pay

## 2016-08-08 DIAGNOSIS — B353 Tinea pedis: Secondary | ICD-10-CM | POA: Insufficient documentation

## 2016-08-08 DIAGNOSIS — F17210 Nicotine dependence, cigarettes, uncomplicated: Secondary | ICD-10-CM | POA: Insufficient documentation

## 2016-08-08 MED ORDER — MICONAZOLE NITRATE 2 % EX POWD: CUTANEOUS | 0 refills | Status: DC | PRN

## 2016-08-08 NOTE — ED Triage Notes (Signed)
AT time of DC ,Pt put on shoes and walked out the door . Pt refused to stay to sign out.

## 2016-08-08 NOTE — ED Provider Notes (Signed)
[facility]-EMERGENCY DEPT Provider Note   CSN: [id number] Arrival date & time: 08/08/16  1527  By signing my name below, I, Clarisse GougeXavier Herndon, attest that this documentation has been prepared under the direction and in the presence of GuadalupeHope Leelah Hanna, [place]. Electronically signed, Clarisse GougeXavier Herndon, ED Scribe. 08/09/16. 3:16 AM.    History   Chief Complaint Chief Complaint  Patient presents with  . Toe Injury  The history is provided by the patient. No language interpreter was used.     HPI Comments: Howard Newman is a 35 y.o. male who presents to the Emergency Department complaining of affected area of both feet between toes x 3 week. The pt reports green discoloration and cracked areas on both feet. Pt states that he has had athlete's foot in the past, but he reports that his current symptoms are worse than previous episodes. He reports that he has applied foot ointment and body lotion to relieve symptoms  but has not used lotion in a week. The pt denies PMHx of DM.   History reviewed. No pertinent past medical history.  There are no active problems to display for this patient.   History reviewed. No pertinent surgical history.     Home Medications    Prior to Admission medications   Medication Sig Start Date End Date Taking? Authorizing Provider  acetaminophen (TYLENOL) 500 MG tablet Take 1,000 mg by mouth daily as needed for moderate pain.     Historical Provider, MD  amoxicillin (AMOXIL) 500 MG capsule Take 1 capsule (500 mg total) by mouth 3 (three) times daily. Patient not taking: Reported on 11/03/2015 07/24/15   Roxy Horsemanobert Browning, PA-C  doxycycline (VIBRAMYCIN) 100 MG capsule Take 1 capsule (100 mg total) by mouth 2 (two) times daily. One po bid x 7 days Patient not taking: Reported on 11/12/2014 07/10/14   Loren Raceravid Yelverton, MD  HYDROcodone-acetaminophen (HYCET) 7.5-325 mg/15 ml solution Take 10 mLs by mouth 4 (four) times daily as needed for moderate pain or severe pain. Patient not  taking: Reported on 11/03/2015 03/07/15   Trixie DredgeEmily West, PA-C  ibuprofen (ADVIL,MOTRIN) 200 MG tablet Take 400 mg by mouth daily as needed for moderate pain.    Historical Provider, MD  ibuprofen (ADVIL,MOTRIN) 600 MG tablet Take 1 tablet (600 mg total) by mouth every 6 (six) hours as needed. Patient not taking: Reported on 11/03/2015 01/22/15   Tatyana Kirichenko, PA-C  miconazole (LOTRIMIN AF) 2 % powder Apply topically as needed for itching. 08/08/16   [name], NP  Multiple Vitamin (MULTIVITAMIN) tablet Take 1 tablet by mouth daily.    Historical Provider, MD  naproxen (NAPROSYN) 500 MG tablet Take 1 tablet (500 mg total) by mouth 2 (two) times daily. 11/03/15   Tatyana Kirichenko, PA-C  oxyCODONE-acetaminophen (PERCOCET) 5-325 MG per tablet Take 1-2 tablets by mouth every 4 (four) hours as needed. Patient not taking: Reported on 11/03/2015 11/12/14   Arthor CaptainAbigail Harris, PA-C  traMADol (ULTRAM) 50 MG tablet Take 1 tablet (50 mg total) by mouth every 6 (six) hours as needed. Patient not taking: Reported on 11/03/2015 12/28/14   Cathren LaineKevin Steinl, MD    Family History History reviewed. No pertinent family history.  Social History Social History  Substance Use Topics  . Smoking status: Current Every Day Smoker    Packs/day: 0.00    Types: Cigarettes  . Smokeless tobacco: Never Used  . Alcohol use Yes     Allergies   Review of patient's allergies indicates no known allergies.  Review of Systems Review of Systems  Skin: Positive for color change and wound.  All other systems reviewed and are negative.    Physical Exam Updated Vital Signs BP 120/84 (BP Location: Left Arm)   Pulse 84   Temp 97.9 F (36.6 C) (Oral)   Resp 16   Ht 5\' 11"  (1.803 m)   Wt 77.1 kg   SpO2 100%   BMI 23.71 kg/m   Physical Exam  Constitutional: He is oriented to person, place, and time. He appears well-developed and well-nourished.  HENT:  Head: Normocephalic and atraumatic.  Eyes: EOM are normal.  Neck:  Normal range of motion.  Cardiovascular: Normal rate.   Pulmonary/Chest: Effort normal.  Musculoskeletal: Normal range of motion.  Bilateral feet with moist skin between the toes c/w monilia. There is cracking of the skin noted as well. There is no erythema or red streaking, the drainage is not purulent.   Neurological: He is alert and oriented to person, place, and time.  Skin: Skin is warm and dry.  Psychiatric: He has a normal mood and affect. Judgment normal.  Nursing note and vitals reviewed.    ED Treatments / Results  DIAGNOSTIC STUDIES: Oxygen Saturation is 100% on RA, normal by my interpretation.    COORDINATION OF CARE: 3:16 AM Discussed treatment plan with pt at bedside and pt agreed to plan.    Labs (all labs ordered are listed, but only abnormal results are displayed) Labs Reviewed - No data to display   Radiology No results found.  Procedures Procedures (including critical care time)  Medications Ordered in ED Medications - No data to display   Initial Impression / Assessment and Plan / ED Course  I have reviewed the triage vital signs and the nursing notes.   Clinical Course  35 y.o. male with itching and burning between his toes stable for d/c without red streaking, fever or other signs of bacterial infection. Will treat for tinea pedis. Referral to Dr. Charlsie Merlesegal given if symptoms do not improve.   Final Clinical Impressions(s) / ED Diagnoses   Final diagnoses:  Tinea pedis of both feet    New Prescriptions Discharge Medication List as of 08/08/2016  4:29 PM    START taking these medications   Details  miconazole (LOTRIMIN AF) 2 % powder Apply topically as needed for itching., Starting Sun 08/08/2016, Print      *I personally performed the services described in this documentation, which was scribed in my presence. The recorded information has been reviewed and is accurate.    [address]Tiyona Desouza Wynned[place] Dube, NP 08/09/16 [id number]    Lyndal Pulleyaniel Knott, MD 08/09/16  [phone]

## 2016-08-08 NOTE — ED Triage Notes (Signed)
Pt noticed green skin and breakdown in bilateral 4th and 5th digit toes. Denies pain or drainage from sites. Pt denies hx of diabetes.

## 2016-09-14 ENCOUNTER — Emergency Department (HOSPITAL_COMMUNITY)
Admission: EM | Admit: 2016-09-14 | Discharge: 2016-09-14 | Disposition: A | Discharge status: Home / Self Care | Payer: Self-pay | Attending: Emergency Medicine | Admitting: Emergency Medicine

## 2016-09-14 ENCOUNTER — Encounter (HOSPITAL_COMMUNITY): Payer: Self-pay | Admitting: Emergency Medicine

## 2016-09-14 DIAGNOSIS — Z79899 Other long term (current) drug therapy: Secondary | ICD-10-CM | POA: Insufficient documentation

## 2016-09-14 DIAGNOSIS — M5441 Lumbago with sciatica, right side: Secondary | ICD-10-CM | POA: Insufficient documentation

## 2016-09-14 DIAGNOSIS — Y929 Unspecified place or not applicable: Secondary | ICD-10-CM | POA: Insufficient documentation

## 2016-09-14 DIAGNOSIS — X500XXA Overexertion from strenuous movement or load, initial encounter: Secondary | ICD-10-CM | POA: Insufficient documentation

## 2016-09-14 DIAGNOSIS — Y939 Activity, unspecified: Secondary | ICD-10-CM | POA: Insufficient documentation

## 2016-09-14 DIAGNOSIS — F17210 Nicotine dependence, cigarettes, uncomplicated: Secondary | ICD-10-CM | POA: Insufficient documentation

## 2016-09-14 DIAGNOSIS — Y990 Civilian activity done for income or pay: Secondary | ICD-10-CM | POA: Insufficient documentation

## 2016-09-14 MED ORDER — PREDNISONE 20 MG PO TABS
ORAL_TABLET | ORAL | 0 refills | Status: DC
Start: 2016-09-14 — End: 2017-05-14

## 2016-09-14 MED ORDER — METHOCARBAMOL 500 MG PO TABS: 500.0000 mg | ORAL_TABLET | Freq: Two times a day (BID) | ORAL | 0 refills | Status: DC

## 2016-09-14 NOTE — ED Triage Notes (Signed)
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 As he was going to get out of a car and bent over, his right lower back started hurting. Pain goes into right leg. Pt able to ambulate

## 2016-09-14 NOTE — Discharge Instructions (Signed)
Take the prescribed medication as directed.  Recommend to try wearing back brace at work, make sure to lift from your knees to prevent any new injury. Follow-up with your primary care doctor. Return to the ED for new or worsening symptoms.

## 2016-09-14 NOTE — ED Provider Notes (Signed)
[facility] Provider Note    By signing my name below, I, Howard Newman, attest that this documentation has been prepared under the direction and in the presence of Sharilyn Sites, PA-C. Electronically Signed: Earmon Newman, ED Scribe. 09/14/16. 1:51 PM.   History   Chief Complaint Chief Complaint  Patient presents with  . Back Pain   The history is provided by the patient and medical records. No language interpreter was used.    HPI Comments:  Howard Newman is a 35 y.o. male who presents to the Emergency Department complaining of right-sided lower back pain that began yesterday. He reports the pain radiates into his RLE. He reports getting out of his car when the pain began. 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 He has soaked in alcohol baths with no significant relief of the pain. Standing and ambulating increase the pain. He denies alleviating factors. He denies trauma, injury or fall. He denies fever, chills, nausea, vomiting, numbness, tingling or weakness of the lower extremities, bowel or bladder incontinence, bruising or wounds. He denies allergies to any medications.    History reviewed. No pertinent past medical history.  There are no active problems to display for this patient.   History reviewed. No pertinent surgical history.     Home Medications    Prior to Admission medications   Medication Sig Start Date End Date Taking? Authorizing Provider  acetaminophen (TYLENOL) 500 MG tablet Take 1,000 mg by mouth daily as needed for moderate pain.     Historical Provider, MD  amoxicillin (AMOXIL) 500 MG capsule Take 1 capsule (500 mg total) by mouth 3 (three) times daily. Patient not taking: Reported on 11/03/2015 07/24/15   Roxy Horseman, PA-C  doxycycline (VIBRAMYCIN) 100 MG capsule Take 1 capsule (100 mg total) by mouth 2 (two) times daily. One po bid x 7 days Patient not taking: Reported on 11/12/2014 07/10/14   Loren Racer, MD  HYDROcodone-acetaminophen (HYCET) 7.5-325 mg/15 ml solution Take 10 mLs by mouth 4 (four) times daily as needed for moderate pain or severe pain. Patient not taking: Reported on 11/03/2015 03/07/15   Trixie Dredge, PA-C  ibuprofen (ADVIL,MOTRIN) 200 MG tablet Take 400 mg by mouth daily as needed for moderate pain.    Historical Provider, MD  ibuprofen (ADVIL,MOTRIN) 600 MG tablet Take 1 tablet (600 mg total) by mouth every 6 (six) hours as needed. Patient not taking: Reported on 11/03/2015 01/22/15   Tatyana Kirichenko, PA-C  miconazole (LOTRIMIN AF) 2 % powder Apply topically as needed for itching. 08/08/16   Hope Orlene Och, NP  Multiple Vitamin (MULTIVITAMIN) tablet Take 1 tablet by mouth daily.    Historical Provider, MD  naproxen (NAPROSYN) 500 MG tablet Take 1 tablet (500 mg total) by mouth 2 (two) times daily. 11/03/15   Tatyana Kirichenko, PA-C  oxyCODONE-acetaminophen (PERCOCET) 5-325 MG per tablet Take 1-2 tablets by mouth every 4 (four) hours as needed. Patient not taking: Reported on 11/03/2015 11/12/14   Arthor Captain, PA-C  traMADol (ULTRAM) 50 MG tablet Take 1 tablet (50 mg total) by mouth every 6 (six) hours as needed. Patient not taking: Reported on 11/03/2015 12/28/14   Cathren Laine, MD    Family History History reviewed. No pertinent family history.  Social History Social History  Substance Use Topics  . Smoking status: Current Every Day Smoker    Packs/day: 0.00    Types: Cigarettes  . Smokeless tobacco: Never Used  . Alcohol use Yes  Allergies   Patient has no known allergies.   Review of Systems Review of Systems  Constitutional: Negative for chills and fever.  Gastrointestinal: Negative for nausea and vomiting.       No bowel or bladder incontinence  Musculoskeletal: Positive for back pain.  Skin: Negative for color change and wound.  Neurological: Negative for weakness and numbness.  All other systems reviewed and are negative.    Physical  Exam Updated Vital Signs BP 124/74 (BP Location: Right Arm)   Pulse 77   Temp 98.1 F (36.7 C) (Oral)   Resp 18   Ht 5\' 11"  (1.803 m)   Wt 171 lb 5 oz (77.7 kg)   SpO2 100%   BMI 23.89 kg/m   Physical Exam  Constitutional: He is oriented to person, place, and time. He appears well-developed and well-nourished.  HENT:  Head: Normocephalic and atraumatic.  Mouth/Throat: Oropharynx is clear and moist.  Eyes: Conjunctivae and EOM are normal. Pupils are equal, round, and reactive to light.  Neck: Normal range of motion.  Cardiovascular: Normal rate, regular rhythm and normal heart sounds.   Pulmonary/Chest: Effort normal and breath sounds normal.  Abdominal: Soft. Bowel sounds are normal.  Musculoskeletal: Normal range of motion. He exhibits tenderness. He exhibits no edema or deformity.  Right SI joint with tenderness to palpation without deformity. Full flexion and extension of lumbar spine. Positive right SLR. Negative left SLR. Normal strength and sensation of both legs, normal gait  Neurological: He is alert and oriented to person, place, and time.  Skin: Skin is warm and dry.  Psychiatric: He has a normal mood and affect.  Nursing note and vitals reviewed.    ED Treatments / Results  DIAGNOSTIC STUDIES: Oxygen Saturation is 100% on RA, normal by my interpretation.   COORDINATION OF CARE: 1:47 PM- Will prescribe Prescribe and muscle relaxer. Offered work note but pt declined. Advised pt to wear a back brace while lifting at work. Pt verbalizes understanding and agrees to plan.  Medications - No data to display  Labs (all labs ordered are listed, but only abnormal results are displayed) Labs Reviewed - No data to display  EKG  EKG Interpretation None       Radiology No results found.  Procedures Procedures (including critical care time)  Medications Ordered in ED Medications - No data to display   Initial Impression / Assessment and Plan / ED Course  I  have reviewed the triage vital signs and the nursing notes.  Pertinent labs & imaging results that were available during my care of the patient were reviewed by me and considered in my medical decision making (see chart for details).  Clinical Course    35 y.o. M here with right lower back pain.  No fall, injury, or trauma.  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  Some TTP of right SI joint without noted deformity. Full ROM of LS. Normal strength/sensation of both legs, normal gait.  No focal deficits or red flag symptoms to suggest cauda equina, epidural abscess, acute disc herniation, or spinal fracture.  More likely lumbar radiculopathy which may be job related.  Recommended to wear back brace for work, lift from knees.  Will start dose of prednisone and muscle relaxer.  Follow-up with PCP.  Discussed plan with patient, he acknowledged understanding and agreed with plan of care.  Return precautions given for new or worsening symptoms.  I personally performed the services described in this documentation, which was scribed  in my presence. The recorded information has been reviewed and is accurate.  Final Clinical Impressions(s) / ED Diagnoses   Final diagnoses:  Right-sided low back pain with right-sided sciatica, unspecified chronicity    New Prescriptions Discharge Medication List as of 09/14/2016  1:53 PM    START taking these medications   Details  methocarbamol (ROBAXIN) 500 MG tablet Take 1 tablet (500 mg total) by mouth 2 (two) times daily., Starting Tue 09/14/2016, Print    predniSONE (DELTASONE) 20 MG tablet Take 40 mg by mouth daily for 3 days, then 20mg  by mouth daily for 3 days, then 10mg  daily for 3 days, Print         Garlon HatchetLisa M Amorita Vanrossum, PA-C 09/14/16 1411    Lyndal Pulleyaniel Knott, MD 09/14/16 [id number]

## 2016-11-15 ENCOUNTER — Encounter (HOSPITAL_COMMUNITY): Payer: Self-pay | Admitting: Emergency Medicine

## 2016-11-15 ENCOUNTER — Emergency Department (HOSPITAL_COMMUNITY)
Admission: EM | Admit: 2016-11-15 | Discharge: 2016-11-15 | Disposition: A | Discharge status: Home / Self Care | Payer: Self-pay | Attending: Physician Assistant | Admitting: Physician Assistant

## 2016-11-15 DIAGNOSIS — F17210 Nicotine dependence, cigarettes, uncomplicated: Secondary | ICD-10-CM | POA: Insufficient documentation

## 2016-11-15 DIAGNOSIS — K029 Dental caries, unspecified: Secondary | ICD-10-CM | POA: Insufficient documentation

## 2016-11-15 MED ORDER — PENICILLIN V POTASSIUM 500 MG PO TABS: 500.0000 mg | ORAL_TABLET | Freq: Four times a day (QID) | ORAL | 0 refills | Status: AC

## 2016-11-15 NOTE — ED Triage Notes (Signed)
Pt from home with c/o dental pain x almost 2 weeks with report of abscess in mouth.  Hx of the same.  NAD, A&O.

## 2016-11-15 NOTE — Discharge Instructions (Signed)
Continue Ibuprofen 600mg  4 times a day Take antibiotic with food Follow up with Dentist (Call today and saw you were seen in ER)

## 2016-11-15 NOTE — ED Provider Notes (Signed)
[facility] Provider Note   CSN: [id number] Arrival date & time: 11/15/16  1048  By signing my name below, I, Howard Newman, attest that this documentation has been prepared under the direction and in the presence of Wells FargoKelly Timouthy Gilardi, PA-C. Electronically Signed: Sonum Newman, Neurosurgeoncribe. 11/15/16. 12:10 PM.  History   Chief Complaint Chief Complaint  Patient presents with  . Dental Pain    The history is provided by the patient. No language interpreter was used.     HPI Comments: Howard Newman is a 36 y.o. male who presents to the Emergency Department complaining of constant, intermittent right lower dental pain that has been ongoing for the past 3 weeks. He reports associated HA. He states brushing his teeth and cold air aggravates his pain and states the pain is worse at night. He has tried ibuprofen 400 mg without relief. He denies fever, difficulty swallowing, facial swelling, SOB.  History reviewed. No pertinent past medical history.  There are no active problems to display for this patient.   History reviewed. No pertinent surgical history.     Home Medications    Prior to Admission medications   Medication Sig Start Date End Date Taking? Authorizing Provider  acetaminophen (TYLENOL) 500 MG tablet Take 1,000 mg by mouth daily as needed for moderate pain.     Historical Provider, MD  amoxicillin (AMOXIL) 500 MG capsule Take 1 capsule (500 mg total) by mouth 3 (three) times daily. Patient not taking: Reported on 11/03/2015 07/24/15   Roxy Horsemanobert Browning, PA-C  doxycycline (VIBRAMYCIN) 100 MG capsule Take 1 capsule (100 mg total) by mouth 2 (two) times daily. One po bid x 7 days Patient not taking: Reported on 11/12/2014 07/10/14   Loren Raceravid Yelverton, MD  HYDROcodone-acetaminophen (HYCET) 7.5-325 mg/15 ml solution Take 10 mLs by mouth 4 (four) times daily as needed for moderate pain or severe pain. Patient not taking: Reported on 11/03/2015 03/07/15   Trixie DredgeEmily West, PA-C  ibuprofen  (ADVIL,MOTRIN) 200 MG tablet Take 400 mg by mouth daily as needed for moderate pain.    Historical Provider, MD  ibuprofen (ADVIL,MOTRIN) 600 MG tablet Take 1 tablet (600 mg total) by mouth every 6 (six) hours as needed. Patient not taking: Reported on 11/03/2015 01/22/15   Tatyana Kirichenko, PA-C  methocarbamol (ROBAXIN) 500 MG tablet Take 1 tablet (500 mg total) by mouth 2 (two) times daily. 09/14/16   Garlon HatchetLisa M Sanders, PA-C  miconazole (LOTRIMIN AF) 2 % powder Apply topically as needed for itching. 08/08/16   Hope Orlene OchM Neese, NP  Multiple Vitamin (MULTIVITAMIN) tablet Take 1 tablet by mouth daily.    Historical Provider, MD  naproxen (NAPROSYN) 500 MG tablet Take 1 tablet (500 mg total) by mouth 2 (two) times daily. 11/03/15   Tatyana Kirichenko, PA-C  oxyCODONE-acetaminophen (PERCOCET) 5-325 MG per tablet Take 1-2 tablets by mouth every 4 (four) hours as needed. Patient not taking: Reported on 11/03/2015 11/12/14   Arthor CaptainAbigail Harris, PA-C  predniSONE (DELTASONE) 20 MG tablet Take 40 mg by mouth daily for 3 days, then 20mg  by mouth daily for 3 days, then 10mg  daily for 3 days 09/14/16   Garlon HatchetLisa M Sanders, PA-C  traMADol (ULTRAM) 50 MG tablet Take 1 tablet (50 mg total) by mouth every 6 (six) hours as needed. Patient not taking: Reported on 11/03/2015 12/28/14   Cathren LaineKevin Steinl, MD    Family History History reviewed. No pertinent family history.  Social History Social History  Substance Use Topics  . Smoking status: Current Every  Day Smoker    Packs/day: 0.00    Types: Cigarettes  . Smokeless tobacco: Never Used  . Alcohol use Yes     Allergies   Patient has no known allergies.   Review of Systems Review of Systems  Constitutional: Negative for fever.  HENT: Positive for dental problem. Negative for facial swelling.   Respiratory: Negative for shortness of breath.   Neurological: Positive for headaches.     Physical Exam Updated Vital Signs BP 124/72 (BP Location: Left Arm)   Pulse 77   Temp  98.5 F (36.9 C) (Oral)   Resp 18   Ht 5\' 11"  (1.803 m)   Wt 170 lb (77.1 kg)   SpO2 98%   BMI 23.71 kg/m   Physical Exam  Constitutional: He is oriented to person, place, and time. He appears well-developed and well-nourished.  HENT:  Head: Normocephalic and atraumatic.  Mouth/Throat: No trismus in the jaw. Abnormal dentition. Dental caries present. No dental abscesses.  Edentulous on the top.  Right bottom molar tender to palpation. No erythema or abscess visualized. No facial swelling. Tolerating secretions. No trismus  Cardiovascular: Normal rate.   Pulmonary/Chest: Effort normal.  Neurological: He is alert and oriented to person, place, and time.  Skin: Skin is warm and dry.  Psychiatric: He has a normal mood and affect.  Nursing note and vitals reviewed.    ED Treatments / Results  DIAGNOSTIC STUDIES: Oxygen Saturation is 98% on RA, normal by my interpretation.    COORDINATION OF CARE: 12:09 PM Discussed treatment plan with pt at bedside and pt agreed to plan.   Labs (all labs ordered are listed, but only abnormal results are displayed) Labs Reviewed - No data to display  EKG  EKG Interpretation None       Radiology No results found.  Procedures Procedures (including critical care time)  Medications Ordered in ED Medications - No data to display   Initial Impression / Assessment and Plan / ED Course  I have reviewed the triage vital signs and the nursing notes.  Pertinent labs & imaging results that were available during my care of the patient were reviewed by me and considered in my medical decision making (see chart for details).  Patient with dentalgia.  No abscess requiring immediate incision and drainage.  Exam not concerning for Ludwig's angina or pharyngeal abscess. Offered dental block but patient declines. Advised Ibuprofen and abx. Referral to dentist on call given. Pt instructed to follow-up with dentist.  Discussed return precautions. Pt  safe for discharge.  Final Clinical Impressions(s) / ED Diagnoses   Final diagnoses:  Pain due to dental caries    New Prescriptions New Prescriptions   No medications on file   I personally performed the services described in this documentation, which was scribed in my presence. The recorded information has been reviewed and is accurate.    Bethel Born, PA-C 11/16/16 1920    Courteney Randall An, MD 11/18/16 [phone]

## 2017-03-02 ENCOUNTER — Emergency Department (HOSPITAL_COMMUNITY)
Admission: EM | Admit: 2017-03-02 | Discharge: 2017-03-02 | Disposition: A | Discharge status: Home / Self Care | Payer: Self-pay | Attending: Emergency Medicine | Admitting: Emergency Medicine

## 2017-03-02 ENCOUNTER — Encounter (HOSPITAL_COMMUNITY): Payer: Self-pay

## 2017-03-02 DIAGNOSIS — F17210 Nicotine dependence, cigarettes, uncomplicated: Secondary | ICD-10-CM | POA: Insufficient documentation

## 2017-03-02 DIAGNOSIS — H6123 Impacted cerumen, bilateral: Secondary | ICD-10-CM | POA: Insufficient documentation

## 2017-03-02 DIAGNOSIS — J069 Acute upper respiratory infection, unspecified: Secondary | ICD-10-CM | POA: Insufficient documentation

## 2017-03-02 DIAGNOSIS — K0889 Other specified disorders of teeth and supporting structures: Secondary | ICD-10-CM | POA: Insufficient documentation

## 2017-03-02 MED ORDER — ACETAMINOPHEN 500 MG PO TABS
1000.0000 mg | ORAL_TABLET | Freq: Once | ORAL | Status: AC
  Administered 2017-03-02: 1000 mg via ORAL
  Filled 2017-03-02: qty 2

## 2017-03-02 MED ORDER — OXYCODONE HCL 5 MG PO TABS
5.0000 mg | ORAL_TABLET | Freq: Once | ORAL | Status: AC
  Administered 2017-03-02: 5 mg via ORAL
  Filled 2017-03-02: qty 1

## 2017-03-02 MED ORDER — IBUPROFEN 800 MG PO TABS
800.0000 mg | ORAL_TABLET | Freq: Once | ORAL | Status: AC
  Administered 2017-03-02: 800 mg via ORAL
  Filled 2017-03-02: qty 1

## 2017-03-02 MED ORDER — PENICILLIN V POTASSIUM 250 MG PO TABS: 250.0000 mg | ORAL_TABLET | Freq: Four times a day (QID) | ORAL | 0 refills | Status: AC

## 2017-03-02 NOTE — ED Provider Notes (Signed)
[facility] Provider Note   CSN: [id number] Arrival date & time: 03/02/17  1437  By signing my name below, I, Phillips Climes, attest that this documentation has been prepared under the direction and in the presence of Melene Plan, DO . Electronically Signed: Phillips Climes, Scribe. 03/02/2017. 3:38 PM.  History   Chief Complaint Chief Complaint  Patient presents with  . Dental Pain   HPI Comments Howard Newman is a 36 y.o. male with a PMHx significant for dental pain, who presents to the Emergency Department with complaints of his chronic aching dental pain x1 day. Pain is specifically in his two lower front teeth and rated 9/10 in severity. No oropharyngeal involvement. No facial swelling. No trouble swallowing. Pt additionally endorses hearing loss 2/2 congestion. He denies experiencing any other acute sx, including abdominal pain, nausea, vomiting, fever, chills or headache.   The history is provided by the patient and medical records. No language interpreter was used.   History reviewed. No pertinent past medical history.  There are no active problems to display for this patient.  History reviewed. No pertinent surgical history.  Home Medications    Prior to Admission medications   Medication Sig Start Date End Date Taking? Authorizing Provider  acetaminophen (TYLENOL) 500 MG tablet Take 1,000 mg by mouth daily as needed for moderate pain.     [provider]  ibuprofen (ADVIL,MOTRIN) 200 MG tablet Take 400 mg by mouth daily as needed for moderate pain.    [provider]  methocarbamol (ROBAXIN) 500 MG tablet Take 1 tablet (500 mg total) by mouth 2 (two) times daily. 09/14/16   Garlon Hatchet, PA-C  miconazole (LOTRIMIN AF) 2 % powder Apply topically as needed for itching. 08/08/16   Janne Napoleon, NP  Multiple Vitamin (MULTIVITAMIN) tablet Take 1 tablet by mouth daily.    [provider]  naproxen (NAPROSYN) 500 MG tablet Take 1 tablet  (500 mg total) by mouth 2 (two) times daily. 11/03/15   Kirichenko, Tatyana, PA-C  penicillin v potassium (VEETID) 250 MG tablet Take 1 tablet (250 mg total) by mouth 4 (four) times daily. 03/02/17 03/09/17  Melene Plan, DO  predniSONE (DELTASONE) 20 MG tablet Take 40 mg by mouth daily for 3 days, then 20mg  by mouth daily for 3 days, then 10mg  daily for 3 days 09/14/16   Garlon Hatchet, PA-C    Family History No family history on file.  Social History Social History  Substance Use Topics  . Smoking status: Current Every Day Smoker    Packs/day: 0.00    Types: Cigarettes  . Smokeless tobacco: Never Used  . Alcohol use Yes     Allergies   Patient has no known allergies.  Review of Systems Review of Systems  Constitutional: Negative for chills and fever.  HENT: Positive for congestion, dental problem and hearing loss. Negative for facial swelling, rhinorrhea, sore throat and trouble swallowing.   Eyes: Negative for discharge and visual disturbance.  Respiratory: Negative for shortness of breath.   Cardiovascular: Negative for chest pain and palpitations.  Gastrointestinal: Negative for abdominal pain, diarrhea and vomiting.  Musculoskeletal: Negative for arthralgias and myalgias.  Skin: Negative for color change and rash.  Neurological: Negative for tremors, syncope and headaches.  Psychiatric/Behavioral: Negative for confusion and dysphoric mood.   Physical Exam Updated Vital Signs BP 117/69 (BP Location: Left Arm)   Pulse 70   Temp 98.4 F (36.9 C) (Oral)   Resp 16  Ht 5\' 11"  (1.803 m)   Wt 82.6 kg (182 lb)   SpO2 100%   BMI 25.38 kg/m   Physical Exam  Constitutional: He is oriented to person, place, and time. He appears well-developed and well-nourished.  HENT:  Head: Normocephalic and atraumatic.  Pain to the lower incisors. There is no laxity, surrounding erythema and no real noted pain. No trismus. No noticeable swelling. Wax impaction bilaterally. Swollen  turbinate's and posterior nasal drip.  Eyes: EOM are normal. Pupils are equal, round, and reactive to light.  Neck: Normal range of motion. Neck supple. No JVD present.  Cardiovascular: Normal rate and regular rhythm.  Exam reveals no gallop and no friction rub.   No murmur heard. Pulmonary/Chest: Effort normal and breath sounds normal. No respiratory distress. He has no wheezes.  Abdominal: He exhibits no distension. There is no rebound and no guarding.  Musculoskeletal: Normal range of motion.  Neurological: He is alert and oriented to person, place, and time.  Skin: No rash noted. No pallor.  Psychiatric: He has a normal mood and affect. His behavior is normal.  Nursing note and vitals reviewed.  ED Treatments / Results  DIAGNOSTIC STUDIES: Oxygen Saturation is 98% on RA, normal by my interpretation.    COORDINATION OF CARE: 3:04 PM Discussed treatment plan with pt at bedside and pt agreed to plan.  Labs (all labs ordered are listed, but only abnormal results are displayed) Labs Reviewed - No data to display  EKG  EKG Interpretation None       Radiology No results found.  Procedures Procedures (including critical care time)  Medications Ordered in ED Medications  acetaminophen (TYLENOL) tablet 1,000 mg (not administered)  ibuprofen (ADVIL,MOTRIN) tablet 800 mg (not administered)  oxyCODONE (Oxy IR/ROXICODONE) immediate release tablet 5 mg (not administered)     Initial Impression / Assessment and Plan / ED Course  I have reviewed the triage vital signs and the nursing notes.  Pertinent labs & imaging results that were available during my care of the patient were reviewed by me and considered in my medical decision making (see chart for details).     36 yo M With dental pain. Going on for quite some time. Worsening over the past few days. Patient is also complaining of congestion and difficulty with hearing. Patient eventually admits to consistently using Q-tips.  Appears to have a bilateral wax impaction. Dental exam with no finding concerning for abscess. We'll have him follow-up with a dentist. Debrox drops at home. PCP follow-up.   I personally performed the services described in this documentation, which was scribed in my presence. The recorded information has been reviewed and is accurate.  3:38 PM:  I have discussed the diagnosis/risks/treatment options with the patient and believe the pt to be eligible for discharge home to follow-up with PCP. We also discussed returning to the ED immediately if new or worsening sx occur. We discussed the sx which are most concerning (e.g., sudden worsening pain, fever, inability to tolerate by mouth) that necessitate immediate return. Medications administered to the patient during their visit and any new prescriptions provided to the patient are listed below.  Medications given during this visit Medications  acetaminophen (TYLENOL) tablet 1,000 mg (not administered)  ibuprofen (ADVIL,MOTRIN) tablet 800 mg (not administered)  oxyCODONE (Oxy IR/ROXICODONE) immediate release tablet 5 mg (not administered)     The patient appears reasonably screen and/or stabilized for discharge and I doubt any other medical condition or other EMC requiring further screening,  evaluation, or treatment in the ED at this time prior to discharge.    Final Clinical Impressions(s) / ED Diagnoses   Final diagnoses:  Pain, dental  Bilateral impacted cerumen  Viral upper respiratory tract infection    New Prescriptions New Prescriptions   PENICILLIN V POTASSIUM (VEETID) 250 MG TABLET    Take 1 tablet (250 mg total) by mouth 4 (four) times daily.     Melene Plan, DO 03/02/17 1538

## 2017-03-02 NOTE — ED Triage Notes (Signed)
Per Pt, Pt is coming from home with complaints of dental pain on the lower anterior pain. Pt denies fevers. Reports bilateral ear "clogging" along with dryness to the outside of the ear.

## 2017-03-02 NOTE — Discharge Instructions (Signed)
Try debrox drops for your ear wax. Follow up with a dentist and a PCP

## 2017-05-14 ENCOUNTER — Encounter (HOSPITAL_COMMUNITY): Payer: Self-pay | Admitting: Emergency Medicine

## 2017-05-14 ENCOUNTER — Telehealth (HOSPITAL_COMMUNITY): Payer: Self-pay | Admitting: Family Medicine

## 2017-05-14 ENCOUNTER — Ambulatory Visit (HOSPITAL_COMMUNITY)
Admission: EM | Admit: 2017-05-14 | Discharge: 2017-05-14 | Disposition: A | Discharge status: Home / Self Care | Payer: Self-pay | Attending: Internal Medicine | Admitting: Internal Medicine

## 2017-05-14 DIAGNOSIS — H9202 Otalgia, left ear: Secondary | ICD-10-CM

## 2017-05-14 DIAGNOSIS — H60502 Unspecified acute noninfective otitis externa, left ear: Secondary | ICD-10-CM

## 2017-05-14 MED ORDER — CIPROFLOXACIN HCL 500 MG PO TABS: 500.0000 mg | ORAL_TABLET | Freq: Two times a day (BID) | ORAL | 0 refills | Status: AC

## 2017-05-14 MED ORDER — CIPROFLOXACIN HCL 500 MG PO TABS: 500.0000 mg | ORAL_TABLET | Freq: Two times a day (BID) | ORAL | 0 refills | Status: DC

## 2017-05-14 NOTE — Discharge Instructions (Signed)
Take ciprofloxacin as directed for ear infection. Take ibuprofen 600-800mg  three times a day for the pain. Put 1-2 drops of baby oil in left ear once a week to help with the ear wax build up. Take over the counter allergy medicine such as Zyrtec for nasal congestion. Follow up with PCP for further management or referrals needed. Monitor for worsening of symptoms, fever, trouble breathing, trouble swallowing, swelling, increased redness/warmth, go to the emergency department for further evaluation.

## 2017-05-14 NOTE — ED Triage Notes (Signed)
The patient presented to the [facility] with a complaint of left ear pain x 1 day. 

## 2017-05-14 NOTE — Telephone Encounter (Signed)
Resending prescription to [facility] on [address]

## 2017-05-14 NOTE — ED Provider Notes (Signed)
CSN: [id number]     Arrival date & time 05/14/17  1711 History   None    Chief Complaint  Patient presents with  . Otalgia   (Consider location/radiation/quality/duration/timing/severity/associated sxs/prior Treatment) 36 year old male comes in with one-day history of left ear pain. States it's throbbing, with lots of drainage. Denies hearing loss. Denies fever, chills, night sweats. Denies cough, sore throat, congestion. Does have some runny nose once in a while. Denies trauma to the ear, use of cotton swabs. Denies recent swimming, but does take baths.      History reviewed. No pertinent past medical history. History reviewed. No pertinent surgical history. History reviewed. No pertinent family history. Social History  Substance Use Topics  . Smoking status: Current Every Day Smoker    Packs/day: 0.00    Types: Cigarettes  . Smokeless tobacco: Never Used  . Alcohol use Yes    Review of Systems  Reason unable to perform ROS: See HPI as above.    Allergies  Patient has no known allergies.  Home Medications   Prior to Admission medications   Medication Sig Start Date End Date Taking? Authorizing Provider  acetaminophen (TYLENOL) 500 MG tablet Take 1,000 mg by mouth daily as needed for moderate pain.    Yes [provider]  ibuprofen (ADVIL,MOTRIN) 200 MG tablet Take 400 mg by mouth daily as needed for moderate pain.   Yes [provider]  ciprofloxacin (CIPRO) 500 MG tablet Take 1 tablet (500 mg total) by mouth every 12 (twelve) hours. 05/14/17 05/21/17  Belinda FisherYu, Amy V, PA-C   Meds Ordered and Administered this Visit  Medications - No data to display  BP 115/65 (BP Location: Right Arm)   Pulse 83   Temp 98.6 F (37 C) (Oral)   Resp 16   SpO2 99%  No data found.   Physical Exam  Constitutional: He is oriented to person, place, and time. He appears well-developed and well-nourished. No distress.  HENT:  Head: Normocephalic and atraumatic.  Nose: Nose  normal. Right sinus exhibits no maxillary sinus tenderness and no frontal sinus tenderness. Left sinus exhibits no maxillary sinus tenderness and no frontal sinus tenderness.  Mouth/Throat: Uvula is midline, oropharynx is clear and moist and mucous membranes are normal.  Right ear without tenderness on palpation of the tragus. Cerumen seen in the canal, tympanic membrane not visible.  Left ear with pain on palpation of the tragus. Discharge seen on external ear, copious drainage and cerumen seen in the canal, mild swelling noted. Tympanic membrane not visible.  Eyes: Pupils are equal, round, and reactive to light. Conjunctivae are normal.  Neck: Normal range of motion. Neck supple.  Cardiovascular: Normal rate, regular rhythm and normal heart sounds.  Exam reveals no gallop and no friction rub.   No murmur heard. Pulmonary/Chest: Effort normal and breath sounds normal. He has no decreased breath sounds. He has no wheezes. He has no rhonchi. He has no rales.  Lymphadenopathy:    He has cervical adenopathy.  Neurological: He is alert and oriented to person, place, and time.  Skin: Skin is warm and dry.  Psychiatric: He has a normal mood and affect. His behavior is normal. Judgment normal.    Urgent Care Course   Clinical Course as of May 14 1841  Sat May 14, 2017  1841 Attempted ear irrigation, patient was unable to tolerate due to pain. Discontinued ear irrigation. Still unable to see tympanic membrane.  [AY]    Clinical Course User Index [AY]  Belinda FisherYu, Amy V, PA-C    Procedures (including critical care time)  Labs Review Labs Reviewed - No data to display  Imaging Review No results found.      MDM   1. Acute otitis externa of left ear, unspecified type    Discussed with patient possible causes of ear pain. Given TM unable to be visualized during exam, cannot rule out otitis media, perforation of the TM. Given painful palpation of the tragus, will treat for otitis externa. Given  drainage and cerumen in the ear, worried otic drops will not be able to penetrate. Will start patient on oral Cipro for treatment. Patient can use baby oil 1-2 drops every week for cerumen softening. Patient to follow up with PCP or ENT for further evaluation as needed. If experiencing worsening of symptoms, fever, increased swelling/redness/warmth, to go to the ED for further evaluation.    Belinda FisherYu, Amy V, PA-C 05/14/17 [phone]

## 2017-06-29 ENCOUNTER — Emergency Department (HOSPITAL_COMMUNITY)
Admission: EM | Admit: 2017-06-29 | Discharge: 2017-06-29 | Disposition: A | Discharge status: Home / Self Care | Payer: Self-pay | Attending: Emergency Medicine | Admitting: Emergency Medicine

## 2017-06-29 ENCOUNTER — Encounter (HOSPITAL_COMMUNITY): Payer: Self-pay | Admitting: *Deleted

## 2017-06-29 DIAGNOSIS — T754XXA Electrocution, initial encounter: Secondary | ICD-10-CM | POA: Insufficient documentation

## 2017-06-29 DIAGNOSIS — M25512 Pain in left shoulder: Secondary | ICD-10-CM | POA: Insufficient documentation

## 2017-06-29 DIAGNOSIS — F17210 Nicotine dependence, cigarettes, uncomplicated: Secondary | ICD-10-CM | POA: Insufficient documentation

## 2017-06-29 DIAGNOSIS — M542 Cervicalgia: Secondary | ICD-10-CM | POA: Insufficient documentation

## 2017-06-29 MED ORDER — IBUPROFEN 400 MG PO TABS: 400.0000 mg | ORAL_TABLET | Freq: Four times a day (QID) | ORAL | 0 refills | Status: DC | PRN

## 2017-06-29 MED ORDER — PREDNISONE 10 MG PO TABS: 20.0000 mg | ORAL_TABLET | Freq: Every day | ORAL | 0 refills | Status: DC

## 2017-06-29 MED ORDER — CYCLOBENZAPRINE HCL 10 MG PO TABS
5.0000 mg | ORAL_TABLET | Freq: Two times a day (BID) | ORAL | 0 refills | Status: DC | PRN
Start: 2017-06-29 — End: 2019-06-06

## 2017-06-29 MED ORDER — CYCLOBENZAPRINE HCL 10 MG PO TABS: 5.0000 mg | ORAL_TABLET | Freq: Two times a day (BID) | ORAL | 0 refills | Status: DC | PRN
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
## 2017-09-12 ENCOUNTER — Emergency Department (HOSPITAL_COMMUNITY)
Admission: EM | Admit: 2017-09-12 | Discharge: 2017-09-12 | Disposition: A | Discharge status: Home / Self Care | Payer: Self-pay | Attending: Emergency Medicine | Admitting: Emergency Medicine

## 2017-09-12 ENCOUNTER — Other Ambulatory Visit: Payer: Self-pay

## 2017-09-12 ENCOUNTER — Encounter (HOSPITAL_COMMUNITY): Payer: Self-pay | Admitting: *Deleted

## 2017-09-12 DIAGNOSIS — F17210 Nicotine dependence, cigarettes, uncomplicated: Secondary | ICD-10-CM | POA: Insufficient documentation

## 2017-09-12 DIAGNOSIS — K0889 Other specified disorders of teeth and supporting structures: Secondary | ICD-10-CM

## 2017-09-12 DIAGNOSIS — Z79899 Other long term (current) drug therapy: Secondary | ICD-10-CM | POA: Insufficient documentation

## 2017-09-12 DIAGNOSIS — B353 Tinea pedis: Secondary | ICD-10-CM | POA: Insufficient documentation

## 2017-09-12 MED ORDER — PENICILLIN V POTASSIUM 500 MG PO TABS: 500.0000 mg | ORAL_TABLET | Freq: Four times a day (QID) | ORAL | 0 refills | Status: AC

## 2017-09-12 MED ORDER — CLOTRIMAZOLE 1 % EX CREA
TOPICAL_CREAM | CUTANEOUS | 0 refills | Status: DC
Start: 2017-09-12 — End: 2017-10-13

## 2017-09-12 MED ORDER — LIDOCAINE VISCOUS 2 % MT SOLN: 15.0000 mL | OROMUCOSAL | 0 refills | Status: DC | PRN

## 2017-09-12 MED ORDER — MELOXICAM 15 MG PO TABS: 15.0000 mg | ORAL_TABLET | Freq: Every day | ORAL | 1 refills | Status: DC

## 2017-09-12 NOTE — ED Provider Notes (Signed)
[facility]   CSN: [id number] Arrival date & time: 09/12/17  0436     History   Chief Complaint Chief Complaint  Patient presents with  . Dental Pain  . Foot Pain    HPI Howard Newman is a 36 y.o. male with no significant past medical history who presents the ED today for dental pain and foot pain.  Patient notes that one week ago he started having right lower dental pain.  He has history of poor dentition and has had multiple extractions for this in the past.  He does not currently have a dentist and has not followed up with one in greater than 1 year.  He notes that his right lower tooth is painful, especially to cold liquids.  He has been taking Tylenol for this without any relief. Denies fever, chills, inability to control secretions, N/V, voice change, facial swelling, drainage or trauma.   Patient notes that he recently started a new job at [employer] and has been wearing closed toed [employer] shoes.  He notes that he works 2 jobs so often he does not change his shoes or socks between jobs.  He started noticing that between his third, fourth and fifth digits of the right foot he has a white substance and there is some irritation and pruritis on the top of these digits.  He has not been taking anything for this.  This is a new problem.  The patient denies any fever, chills, decreased range of motion, trauma, numbness or tingling.    HPI  History reviewed. No pertinent past medical history.  There are no active problems to display for this patient.   No past surgical history on file.     Home Medications    Prior to Admission medications   Medication Sig Start Date End Date Taking? Authorizing Provider  acetaminophen (TYLENOL) 500 MG tablet Take 1,000 mg by mouth daily as needed for moderate pain.     [provider]  cyclobenzaprine (FLEXERIL) 10 MG tablet Take 0.5-1 tablets (5-10 mg total) by mouth 2 (two)  times daily as needed. 06/29/17   Marlon PelGreene, Tiffany, PA-C  ibuprofen (ADVIL,MOTRIN) 200 MG tablet Take 400 mg by mouth daily as needed for moderate pain.    [provider]  predniSONE (DELTASONE) 10 MG tablet Take 2 tablets (20 mg total) by mouth daily. 06/29/17   Marlon PelGreene, Tiffany, PA-C    Family History No family history on file.  Social History Social History   Tobacco Use  . Smoking status: Current Every Day Smoker    Packs/day: 0.00    Types: Cigarettes  . Smokeless tobacco: Never Used  Substance Use Topics  . Alcohol use: Yes  . Drug use: Yes    Types: Marijuana     Allergies   Patient has no known allergies.   Review of Systems Review of Systems  Constitutional: Negative for chills and fever.  HENT: Positive for dental problem. Negative for congestion, drooling, facial swelling, rhinorrhea, sinus pressure, sinus pain, sore throat, trouble swallowing and voice change.   Respiratory: Negative for cough and shortness of breath.   Gastrointestinal: Negative for nausea and vomiting.  Musculoskeletal: Positive for arthralgias.  Skin: Positive for color change.  Neurological: Negative for weakness and numbness.  All other systems reviewed and are negative.    Physical Exam Updated Vital Signs BP 105/62 (BP Location: Right Arm)   Pulse 68   Temp 98.3 F (36.8 C) (Oral)  Resp 15   Ht 5\' 10"  (1.778 m)   Wt 78 kg (172 lb)   SpO2 100%   BMI 24.68 kg/m   Physical Exam  Constitutional: He appears well-developed and well-nourished.  HENT:  Head: Normocephalic and atraumatic.  Right Ear: External ear normal.  Left Ear: External ear normal.  The patient has normal phonation and is in control of secretions. No stridor.  Midline uvula without edema. Soft palate rises symmetrically.  No tonsillar erythema or exudates. No PTA. Tongue protrusion is normal. No trismus. No creptius on neck palpation. No facial or neck swelling. Patient has poor dentition with multiple  missing teeth. There is no obvious cavity but patient has pain to indicated tooth. No gingival erythema or fluctuance noted along gumline. No floor edema. Mucus membranes moist.   Eyes: Conjunctivae are normal. Right eye exhibits no discharge. Left eye exhibits no discharge. No scleral icterus.  Neck: Trachea normal, full passive range of motion without pain and phonation normal. No spinous process tenderness present. No neck rigidity. No erythema and normal range of motion present.  No meningismus.  Pulmonary/Chest: Effort normal. No respiratory distress.  Musculoskeletal:  There is obvious tinea pedis between 3rd, 4th and 5th digits of right toes. Some overlying erythema surrounding this. No fluctuance or ulcerations. Skin intact. Patient  with full range of motion of all digits.  Cap refill less than 2 seconds.  Patient with intact sensation to light touch of the entire foot.  No ulcers or open wounds.  Patient is neurovascularly intact.  Neurological: He is alert.  Skin: No pallor.  Psychiatric: He has a normal mood and affect.  Nursing note and vitals reviewed.    ED Treatments / Results  Labs (all labs ordered are listed, but only abnormal results are displayed) Labs Reviewed - No data to display  EKG  EKG Interpretation None       Radiology No results found.  Procedures Procedures (including critical care time)  Medications Ordered in ED Medications - No data to display   Initial Impression / Assessment and Plan / ED Course  I have reviewed the triage vital signs and the nursing notes.  Pertinent labs & imaging results that were available during my care of the patient were reviewed by me and considered in my medical decision making (see chart for details).     Patient with toothache.  No gross abscess.  Exam unconcerning for Ludwig's angina or spread of infection.  Will treat with penicillin, viscous lidocaine and anti-inflammatories medicine.  Urged patient to  follow-up with dentist.    Patient with tinea pedis of right foot. No evidence of gross abscess. No TTP or trauma that would make me feel xray is needed. No concern for septic joint as patient is without fever and can range joints without pain or difficulty. Will treat patient with clotrimazole. Patient to follow up with PCP. They were given good hygiene instructions. Patient appears safe for discharge.   Final Clinical Impressions(s) / ED Diagnoses   Final diagnoses:  Tinea pedis of right foot  Pain, dental    ED Discharge Orders        Ordered    lidocaine (XYLOCAINE) 2 % solution  As needed     09/12/17 0946    meloxicam (MOBIC) 15 MG tablet  Daily     09/12/17 0946    clotrimazole (LOTRIMIN) 1 % cream     09/12/17 0946    penicillin v potassium (VEETID) 500 MG tablet  4 times daily     09/12/17 0946       Princella Pellegrini 09/12/17 1610    Azalia Bilis, MD 09/12/17 [phone]

## 2017-09-12 NOTE — ED Triage Notes (Signed)
The pt  Has had a toothache for 2 weeks  He also wants to be seen for rt foot pain  No known injury for about the same time

## 2017-09-12 NOTE — Discharge Instructions (Signed)
Please read and follow all provided instructions.  Your diagnoses today include:  1. Tinea pedis of right foot   2. Pain, dental     The exam and treatment you received today has been provided on an emergency basis only. This is not a substitute for complete medical or dental care. This problem will not resolve on its own without the care of a dentist.  Tests performed today include: 1. Vital signs. See below for your results today.   Medications prescribed:   Take any prescribed medications only as directed. Use mobic for pain with food. Use the viscous lidocaine for mouth pain. Swish with the lidocaine and spit it out. Do not swallow it.  Please take all of your antibiotics until finished!   You may develop abdominal discomfort or diarrhea from the antibiotic.  You may help offset this with probiotics which you can buy or get in yogurt. Do not eat or take the probiotics until 2 hours after your antibiotic. Do not take your medicine if develop an itchy rash, swelling in your mouth or lips, or difficulty breathing.    Home care instructions:  Follow any educational materials contained in this packet.  Follow-up instructions: Please follow-up with your dentist for further evaluation of your symptoms.   Please Contact this number: [phone] to get into contact with an emergency dental service to schedule an appointment with a local dentist.   Dental Assistance: See below for dental referrals  Return instructions:  1. Please return to the Emergency Department if you experience worsening symptoms. 2. Please return if you develop a fever, you develop more swelling in your face or neck, you have trouble breathing or swallowing food. 3. Please return if you have any other emergent concerns.  In addition.  You were also found to have a fungal infection of the foot. Please apply topical medication as directed to the area. Follow attached instructions for good hygiene practices as we  discussed.   Additional Information:  Your vital signs today were: BP 105/62 (BP Location: Right Arm)    Pulse 68    Temp 98.3 F (36.8 C) (Oral)    Resp 15    Ht 5\' 10"  (1.778 m)    Wt 78 kg (172 lb)    SpO2 100%    BMI 24.68 kg/m  If your blood pressure (BP) was elevated above 135/85 this visit, please have this repeated by your doctor within one month. --------------

## 2017-10-13 ENCOUNTER — Encounter (HOSPITAL_COMMUNITY): Payer: Self-pay | Admitting: Emergency Medicine

## 2017-10-13 ENCOUNTER — Other Ambulatory Visit: Payer: Self-pay

## 2017-10-13 ENCOUNTER — Ambulatory Visit (HOSPITAL_COMMUNITY)
Admission: EM | Admit: 2017-10-13 | Discharge: 2017-10-13 | Disposition: A | Discharge status: Home / Self Care | Payer: Self-pay | Attending: Family Medicine | Admitting: Family Medicine

## 2017-10-13 DIAGNOSIS — N4889 Other specified disorders of penis: Secondary | ICD-10-CM | POA: Insufficient documentation

## 2017-10-13 DIAGNOSIS — F17290 Nicotine dependence, other tobacco product, uncomplicated: Secondary | ICD-10-CM | POA: Insufficient documentation

## 2017-10-13 LAB — POCT URINALYSIS DIP (DEVICE)
Bilirubin Urine: NEGATIVE
GLUCOSE, UA: NEGATIVE mg/dL
Hgb urine dipstick: NEGATIVE
KETONES UR: NEGATIVE mg/dL
Leukocytes, UA: NEGATIVE
Nitrite: NEGATIVE
PH: 8.5 — AB (ref 5.0–8.0)
Protein, ur: NEGATIVE mg/dL
SPECIFIC GRAVITY, URINE: 1.015 (ref 1.005–1.030)
Urobilinogen, UA: 0.2 mg/dL (ref 0.0–1.0)

## 2017-10-13 MED ORDER — CLOTRIMAZOLE 1 % EX CREA: TOPICAL_CREAM | CUTANEOUS | 0 refills | Status: DC

## 2017-10-13 NOTE — Discharge Instructions (Signed)
Irritation could be due to yeast. You can apply lotrimin cream as directed on area. Urine sent, you will be contacted with any positive results that requires further treatment. Refrain from sexual activity for the next 7 days. Monitor for any worsening of symptoms, fever, abdominal pain, nausea, vomiting, penile lesion (sores), testicular pain/swelling to follow up for reevaluation.

## 2017-10-13 NOTE — ED Triage Notes (Signed)
Pt states his g/f has a yeast infection and he is now having penile itching/irritation.  He said he had a bump on his penis but it is gone now.

## 2017-10-13 NOTE — ED Provider Notes (Signed)
[facility]    CSN: [id number] Arrival date & time: 10/13/17  1102     History   Chief Complaint Chief Complaint  Patient presents with  . penil irritation    HPI Howard Newman is a 37 y.o. male.   37 year old male comes in for few day history of penile irritation.  Patient states partner has yeast infection.  He states he had a bump on his penis that was irritating, itching, and after rubbing/scratching, little white discharge.  States the bump on his penis is now resolved.  Denies fever, chills, night sweats.  Denies abdominal pain, nausea, vomiting.  Denies urinary symptoms such as dysuria, hematuria, frequency.  Denies penile discharge, pain, testicular swelling, pain.  Sexually active with one partner, no condom use.      History reviewed. No pertinent past medical history.  There are no active problems to display for this patient.   History reviewed. No pertinent surgical history.     Home Medications    Prior to Admission medications   Medication Sig Start Date End Date Taking? Authorizing Provider  ibuprofen (ADVIL,MOTRIN) 200 MG tablet Take 400 mg by mouth daily as needed for moderate pain.   Yes [provider]  meloxicam (MOBIC) 15 MG tablet Take 1 tablet (15 mg total) by mouth daily. 09/12/17  Yes Maczis, Elmer SowMichael M, PA-C  acetaminophen (TYLENOL) 500 MG tablet Take 1,000 mg by mouth daily as needed for moderate pain.     [provider]  clotrimazole (LOTRIMIN) 1 % cream Apply to affected area  2 times daily 10/13/17   Cathie HoopsYu, Amy V, PA-C  cyclobenzaprine (FLEXERIL) 10 MG tablet Take 0.5-1 tablets (5-10 mg total) by mouth 2 (two) times daily as needed. 06/29/17   Marlon PelGreene, Tiffany, PA-C  lidocaine (XYLOCAINE) 2 % solution Use as directed 15 mLs in the mouth or throat as needed for mouth pain. 09/12/17   Maczis, Elmer SowMichael M, PA-C    Family History History reviewed. No pertinent family history.  Social History Social History   Tobacco  Use  . Smoking status: Current Every Day Smoker    Packs/day: 0.00    Types: Cigars  . Smokeless tobacco: Never Used  Substance Use Topics  . Alcohol use: Yes  . Drug use: Yes    Types: Marijuana     Allergies   Patient has no known allergies.   Review of Systems Review of Systems  Reason unable to perform ROS: See HPI as above.     Physical Exam Triage Vital Signs ED Triage Vitals [10/13/17 1151]  Enc Vitals Group     BP 112/67     Pulse Rate 80     Resp      Temp 98.3 F (36.8 C)     Temp Source Oral     SpO2 99 %     Weight      Height      Head Circumference      Peak Flow      Pain Score      Pain Loc      Pain Edu?      Excl. in GC?    No data found.  Updated Vital Signs BP 112/67 (BP Location: Left Arm)   Pulse 80   Temp 98.3 F (36.8 C) (Oral)   SpO2 99%   Physical Exam  Constitutional: He is oriented to person, place, and time. He appears well-developed and well-nourished. No distress.  HENT:  Head: Normocephalic and  atraumatic.  Eyes: Conjunctivae are normal. Pupils are equal, round, and reactive to light.  Genitourinary: Circumcised. No penile erythema. No discharge found.  Genitourinary Comments: Small bump at the base of the glans, without erythema, increased warmth, tenderness to palpation.  No open sores, vesicles seen.   Neurological: He is alert and oriented to person, place, and time.     UC Treatments / Results  Labs (all labs ordered are listed, but only abnormal results are displayed) Labs Reviewed  POCT URINALYSIS DIP (DEVICE) - Abnormal; Notable for the following components:      Result Value   pH 8.5 (*)    All other components within normal limits  URINE CYTOLOGY ANCILLARY ONLY    EKG  EKG Interpretation None       Radiology No results found.  Procedures Procedures (including critical care time)  Medications Ordered in UC Medications - No data to display   Initial Impression / Assessment and Plan / UC  Course  I have reviewed the triage vital signs and the nursing notes.  Pertinent labs & imaging results that were available during my care of the patient were reviewed by me and considered in my medical decision making (see chart for details).    Will have patient apply lotrimin on affected area. No open sores/vesicles seen for HSV/syphilis. Cytology sent, patient will be contacted with any positive results that require additional treatment. Patient to refrain from sexual activity for the next 7 days. Patient to follow up with urology if symptoms not improving. Return precautions given. Patient expresses understanding and agrees to plan.   Final Clinical Impressions(s) / UC Diagnoses   Final diagnoses:  Penile irritation    ED Discharge Orders        Ordered    clotrimazole (LOTRIMIN) 1 % cream     10/13/17 1240        [address] PA-C 10/13/17 1247

## 2017-10-14 ENCOUNTER — Telehealth (HOSPITAL_COMMUNITY): Payer: Self-pay | Admitting: *Deleted

## 2017-10-14 LAB — URINE CYTOLOGY ANCILLARY ONLY
Chlamydia: NEGATIVE
Neisseria Gonorrhea: NEGATIVE
Trichomonas: NEGATIVE

## 2017-10-17 LAB — URINE CYTOLOGY ANCILLARY ONLY: Candida vaginitis: NEGATIVE

## 2018-01-06 ENCOUNTER — Other Ambulatory Visit: Payer: Self-pay | Admitting: Family Medicine

## 2018-01-06 ENCOUNTER — Ambulatory Visit: Payer: Self-pay

## 2018-01-06 DIAGNOSIS — M79674 Pain in right toe(s): Secondary | ICD-10-CM

## 2018-03-30 ENCOUNTER — Emergency Department (HOSPITAL_COMMUNITY)
Admission: EM | Admit: 2018-03-30 | Discharge: 2018-03-30 | Disposition: A | Discharge status: Home / Self Care | Payer: Self-pay | Attending: Emergency Medicine | Admitting: Emergency Medicine

## 2018-03-30 ENCOUNTER — Encounter (HOSPITAL_COMMUNITY): Payer: Self-pay

## 2018-03-30 ENCOUNTER — Other Ambulatory Visit: Payer: Self-pay

## 2018-03-30 DIAGNOSIS — F17290 Nicotine dependence, other tobacco product, uncomplicated: Secondary | ICD-10-CM | POA: Insufficient documentation

## 2018-03-30 DIAGNOSIS — K0889 Other specified disorders of teeth and supporting structures: Secondary | ICD-10-CM | POA: Insufficient documentation

## 2018-03-30 DIAGNOSIS — Z79899 Other long term (current) drug therapy: Secondary | ICD-10-CM | POA: Insufficient documentation

## 2018-03-30 MED ORDER — NAPROXEN 375 MG PO TABS: 375.0000 mg | ORAL_TABLET | Freq: Two times a day (BID) | ORAL | 0 refills | Status: DC

## 2018-03-30 MED ORDER — PENICILLIN V POTASSIUM 500 MG PO TABS: 500.0000 mg | ORAL_TABLET | Freq: Four times a day (QID) | ORAL | 0 refills | Status: AC

## 2018-03-30 NOTE — Discharge Instructions (Addendum)
You have a dental injury/infection. It is very important that you get evaluated by a dentist as soon as possible. Call tomorrow to schedule an appointment. Please take the Naproxen as prescribed for pain. Do not take any additional NSAIDs including Motrin, Aleve, Ibuprofen, Advil.tylenol as needed for pain. Take your full course of antibiotics. Read the instructions below.  Eat a soft or liquid diet and rinse your mouth out after meals with warm water. You should see a dentist or return here at once if you have increased swelling, increased pain or uncontrolled bleeding from the site of your injury.  SEEK MEDICAL CARE IF:  You have increased pain not controlled with medicines.  You have swelling around your tooth, in your face or neck.  You have bleeding which starts, continues, or gets worse.  You have a fever >101 If you are unable to open your mouth

## 2018-03-30 NOTE — ED Provider Notes (Signed)
[facility]   CSN: [id number] Arrival date & time: 03/30/18  [id number]     History   Chief Complaint Chief Complaint  Patient presents with  . Dental Pain    HPI Arline Aspicholas D Harron is a 37 y.o. male.  HPI 37 year old African-American male with no pertinent past medical history presents to the ED for evaluation of dental pain.  Patient states that this pain started approximately 1 week ago.  Patient describes the pain as aching and throbbing.  Does not radiate.  Reports the pain is worse with palpation and eating.  He has been taking Tylenol for his pain with only little relief.  States the pain got worse last night.  Reports that he does not have a dentist at this time.  Denies any associated fevers, chills, difficulty breathing, difficulty swallowing. History reviewed. No pertinent past medical history.  There are no active problems to display for this patient.   History reviewed. No pertinent surgical history.      Home Medications    Prior to Admission medications   Medication Sig Start Date End Date Taking? Authorizing Provider  acetaminophen (TYLENOL) 500 MG tablet Take 1,000 mg by mouth daily as needed for moderate pain.     [provider]  clotrimazole (LOTRIMIN) 1 % cream Apply to affected area  2 times daily 10/13/17   Cathie HoopsYu, Amy V, PA-C  cyclobenzaprine (FLEXERIL) 10 MG tablet Take 0.5-1 tablets (5-10 mg total) by mouth 2 (two) times daily as needed. 06/29/17   Marlon PelGreene, Tiffany, PA-C  ibuprofen (ADVIL,MOTRIN) 200 MG tablet Take 400 mg by mouth daily as needed for moderate pain.    [provider]  lidocaine (XYLOCAINE) 2 % solution Use as directed 15 mLs in the mouth or throat as needed for mouth pain. 09/12/17   Maczis, Elmer SowMichael M, PA-C  meloxicam (MOBIC) 15 MG tablet Take 1 tablet (15 mg total) by mouth daily. 09/12/17   Maczis, Elmer SowMichael M, PA-C  naproxen (NAPROSYN) 375 MG tablet Take 1 tablet (375 mg total)  by mouth 2 (two) times daily. 03/30/18   Demetrios LollLeaphart, Sye Schroepfer T, PA-C  penicillin v potassium (VEETID) 500 MG tablet Take 1 tablet (500 mg total) by mouth 4 (four) times daily for 7 days. 03/30/18 04/06/18  Rise MuLeaphart, Geraldy Akridge T, PA-C    Family History No family history on file.  Social History Social History   Tobacco Use  . Smoking status: Current Every Day Smoker    Packs/day: 0.00    Types: Cigars  . Smokeless tobacco: Never Used  Substance Use Topics  . Alcohol use: Yes  . Drug use: Yes    Types: Marijuana     Allergies   Patient has no known allergies.   Review of Systems Review of Systems  Constitutional: Negative for chills and fever.  HENT: Positive for dental problem.   Gastrointestinal: Negative for vomiting.  Neurological: Negative for headaches.     Physical Exam Updated Vital Signs BP 110/74   Pulse 74   Temp (!) 97.4 F (36.3 C) (Oral)   Resp 20   SpO2 100%   Physical Exam  Constitutional: He appears well-developed and well-nourished. No distress.  HENT:  Head: Normocephalic and atraumatic.  Mouth/Throat: Oropharynx is clear and moist and mucous membranes are normal.    Several missing dentition throughout the mouth.  Poor dentition with several dental caries.  No sublingual or submandibular swelling.  Oropharynx is clear.  Managing secretions tolerating airway.  Speaking in complete sentences.  Eyes: Right eye exhibits no discharge. Left eye exhibits no discharge. No scleral icterus.  Neck: Normal range of motion. Neck supple.  Pulmonary/Chest: No respiratory distress.  Musculoskeletal: Normal range of motion.  Lymphadenopathy:    He has no cervical adenopathy.  Neurological: He is alert.  Skin: Skin is warm and dry. Capillary refill takes less than 2 seconds. No pallor.  Psychiatric: His behavior is normal. Judgment and thought content normal.  Nursing note and vitals reviewed.    ED Treatments / Results  Labs (all labs ordered are listed,  but only abnormal results are displayed) Labs Reviewed - No data to display  EKG None  Radiology No results found.  Procedures Procedures (including critical care time)  Medications Ordered in ED Medications - No data to display   Initial Impression / Assessment and Plan / ED Course  I have reviewed the triage vital signs and the nursing notes.  Pertinent labs & imaging results that were available during my care of the patient were reviewed by me and considered in my medical decision making (see chart for details).     Patient with toothache.  No gross abscess.  Exam unconcerning for Ludwig's angina or spread of infection.  Will treat with penicillin and pain medicine.  Urged patient to follow-up with dentist.     Final Clinical Impressions(s) / ED Diagnoses   Final diagnoses:  Pain, dental    ED Discharge Orders        Ordered    penicillin v potassium (VEETID) 500 MG tablet  4 times daily     03/30/18 2157    naproxen (NAPROSYN) 375 MG tablet  2 times daily     03/30/18 2157       Wallace Keller 03/30/18 2218    Tilden Fossa, MD 03/31/18 [phone]

## 2018-03-30 NOTE — ED Provider Notes (Signed)
Patient placed in Quick Look pathway, seen and evaluated   Chief Complaint: dental pain   HPI:   R lower premolar, Aching and thorbbing, no fevers or difficulty swallowing  ROS: dental pain. (one)  Physical Exam:   Gen: No distress  Neuro: Awake and Alert  Skin: Warm    Focused Exam: Poor dentition   Initiation of care has begun. The patient has been counseled on the process, plan, and necessity for staying for the completion/evaluation, and the remainder of the medical screening examination    Arthor CaptainHarris, Nakyia Dau, Cordelia Poche-C 03/30/18 1932    Linwood DibblesKnapp, Jon, MD 04/02/18 2022

## 2018-03-30 NOTE — ED Triage Notes (Signed)
Pt states that he has been having dental pain on the R bottom since last week, got worse last night.

## 2018-08-15 ENCOUNTER — Encounter (HOSPITAL_COMMUNITY): Payer: Self-pay

## 2018-08-15 ENCOUNTER — Other Ambulatory Visit: Payer: Self-pay

## 2018-08-15 ENCOUNTER — Emergency Department (HOSPITAL_COMMUNITY)
Admission: EM | Admit: 2018-08-15 | Discharge: 2018-08-16 | Disposition: A | Discharge status: Home / Self Care | Payer: Self-pay | Attending: Emergency Medicine | Admitting: Emergency Medicine

## 2018-08-15 DIAGNOSIS — Y929 Unspecified place or not applicable: Secondary | ICD-10-CM | POA: Insufficient documentation

## 2018-08-15 DIAGNOSIS — W500XXA Accidental hit or strike by another person, initial encounter: Secondary | ICD-10-CM | POA: Insufficient documentation

## 2018-08-15 DIAGNOSIS — H1131 Conjunctival hemorrhage, right eye: Secondary | ICD-10-CM | POA: Insufficient documentation

## 2018-08-15 DIAGNOSIS — Y998 Other external cause status: Secondary | ICD-10-CM | POA: Insufficient documentation

## 2018-08-15 DIAGNOSIS — S022XXA Fracture of nasal bones, initial encounter for closed fracture: Secondary | ICD-10-CM | POA: Insufficient documentation

## 2018-08-15 DIAGNOSIS — Y9389 Activity, other specified: Secondary | ICD-10-CM | POA: Insufficient documentation

## 2018-08-15 DIAGNOSIS — F17290 Nicotine dependence, other tobacco product, uncomplicated: Secondary | ICD-10-CM | POA: Insufficient documentation

## 2018-08-15 DIAGNOSIS — S0993XA Unspecified injury of face, initial encounter: Secondary | ICD-10-CM

## 2018-08-15 DIAGNOSIS — S0231XA Fracture of orbital floor, right side, initial encounter for closed fracture: Secondary | ICD-10-CM | POA: Insufficient documentation

## 2018-08-15 DIAGNOSIS — Z79899 Other long term (current) drug therapy: Secondary | ICD-10-CM | POA: Insufficient documentation

## 2018-08-15 MED ORDER — TETRACAINE HCL 0.5 % OP SOLN
1.0000 [drp] | Freq: Once | OPHTHALMIC | Status: AC
  Administered 2018-08-15: 1 [drp] via OPHTHALMIC
  Filled 2018-08-15: qty 4

## 2018-08-15 MED ORDER — FLUORESCEIN SODIUM 1 MG OP STRP
1.0000 | ORAL_STRIP | Freq: Once | OPHTHALMIC | Status: AC
  Administered 2018-08-15: 1 via OPHTHALMIC
  Filled 2018-08-15: qty 1
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
## 2018-08-16 ENCOUNTER — Emergency Department (HOSPITAL_COMMUNITY): Payer: Self-pay

## 2018-08-16 ENCOUNTER — Other Ambulatory Visit: Payer: Self-pay

## 2018-08-16 MED ORDER — CIPROFLOXACIN-DEXAMETHASONE 0.3-0.1 % OT SUSP: 4.0000 [drp] | Freq: Two times a day (BID) | OTIC | 0 refills | Status: DC

## 2018-08-16 MED ORDER — HYDROCODONE-ACETAMINOPHEN 5-325 MG PO TABS: 1.0000 | ORAL_TABLET | ORAL | 0 refills | Status: DC | PRN

## 2018-08-16 MED ORDER — IBUPROFEN 800 MG PO TABS: 800.0000 mg | ORAL_TABLET | Freq: Three times a day (TID) | ORAL | 0 refills | Status: DC

## 2018-08-16 NOTE — Discharge Instructions (Signed)
Take the prescribed medication as directed.  Be careful when blowing your nose at home. Follow-up with both the ear doctor and the eye doctor about your injuries. Return to the ED for new or worsening symptoms.

## 2018-09-01 ENCOUNTER — Ambulatory Visit (HOSPITAL_COMMUNITY)
Admission: EM | Admit: 2018-09-01 | Discharge: 2018-09-01 | Disposition: A | Discharge status: Home / Self Care | Payer: Self-pay | Attending: Family Medicine | Admitting: Family Medicine

## 2018-09-01 ENCOUNTER — Encounter (HOSPITAL_COMMUNITY): Payer: Self-pay | Admitting: Emergency Medicine

## 2018-09-01 DIAGNOSIS — S022XXA Fracture of nasal bones, initial encounter for closed fracture: Secondary | ICD-10-CM

## 2018-09-01 DIAGNOSIS — S0231XA Fracture of orbital floor, right side, initial encounter for closed fracture: Secondary | ICD-10-CM

## 2018-09-01 MED ORDER — IBUPROFEN 800 MG PO TABS: 800.0000 mg | ORAL_TABLET | Freq: Three times a day (TID) | ORAL | 0 refills | Status: DC
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
## 2018-09-01 NOTE — Discharge Instructions (Addendum)
Continue ibuprofen for pain.  Please make appointments with your nose and throat doctor as well as eye doctor for evaluation.

## 2018-11-20 ENCOUNTER — Encounter (HOSPITAL_COMMUNITY): Payer: Self-pay | Admitting: Emergency Medicine

## 2018-11-20 ENCOUNTER — Ambulatory Visit (HOSPITAL_COMMUNITY)
Admission: EM | Admit: 2018-11-20 | Discharge: 2018-11-20 | Disposition: A | Discharge status: Home / Self Care | Payer: Self-pay | Attending: Family Medicine | Admitting: Family Medicine

## 2018-11-20 DIAGNOSIS — H6123 Impacted cerumen, bilateral: Secondary | ICD-10-CM | POA: Insufficient documentation

## 2018-11-20 DIAGNOSIS — Z113 Encounter for screening for infections with a predominantly sexual mode of transmission: Secondary | ICD-10-CM | POA: Insufficient documentation

## 2018-11-20 DIAGNOSIS — Z202 Contact with and (suspected) exposure to infections with a predominantly sexual mode of transmission: Secondary | ICD-10-CM

## 2018-11-20 LAB — POCT URINALYSIS DIP (DEVICE)
Bilirubin Urine: NEGATIVE
Glucose, UA: NEGATIVE mg/dL
Hgb urine dipstick: NEGATIVE
Ketones, ur: NEGATIVE mg/dL
Leukocytes, UA: NEGATIVE
Nitrite: NEGATIVE
Protein, ur: NEGATIVE mg/dL
UROBILINOGEN UA: 0.2 mg/dL (ref 0.0–1.0)
pH: 6 (ref 5.0–8.0)

## 2018-11-20 MED ORDER — AZITHROMYCIN 250 MG PO TABS
1000.0000 mg | ORAL_TABLET | Freq: Once | ORAL | Status: AC
  Administered 2018-11-20: 1000 mg via ORAL

## 2018-11-20 MED ORDER — CEFTRIAXONE SODIUM 250 MG IJ SOLR
250.0000 mg | Freq: Once | INTRAMUSCULAR | Status: AC
  Administered 2018-11-20: 250 mg via INTRAMUSCULAR

## 2018-11-20 MED ORDER — CEFTRIAXONE SODIUM 250 MG IJ SOLR
INTRAMUSCULAR | Status: AC
  Filled 2018-11-20: qty 250

## 2018-11-20 MED ORDER — AZITHROMYCIN 250 MG PO TABS
ORAL_TABLET | ORAL | Status: AC
  Filled 2018-11-20: qty 4

## 2018-11-20 NOTE — Discharge Instructions (Addendum)
We are sending your  urine for testing for STDs Lab results pending We will go ahead and treat you prophylactically as requested Both of your ears were impacted with wax we have flushed your ears for you Follow up as needed for continued or worsening symptoms

## 2018-11-20 NOTE — ED Triage Notes (Signed)
Pt c/o L ear pain x2 days. Pt also wants to be checked for stds. Denies symptoms.

## 2018-11-20 NOTE — ED Provider Notes (Signed)
[facility]    CSN: [id number] Arrival date & time: 11/20/18  1626     History   Chief Complaint Chief Complaint  Patient presents with  . Otalgia  . SEXUALLY TRANSMITTED DISEASE    HPI Arline Aspicholas D Hopke is a 38 y.o. male.   Is a 38 year old male with past medical history of STDs.  He presents today for STD screening.  He started with multiple partners and is now concerned.  He would like to be treated.  He denies any current symptoms.  He is also had left ear discomfort for 2 days.  Denies any significant pain but reports trouble hearing and fullness in the ear.  No associated fever, chills, cough, congestion.   ROS per HPI       History reviewed. No pertinent past medical history.  There are no active problems to display for this patient.   History reviewed. No pertinent surgical history.     Home Medications    Prior to Admission medications   Medication Sig Start Date End Date Taking? Authorizing Provider  acetaminophen (TYLENOL) 500 MG tablet Take 1,000 mg by mouth daily as needed for moderate pain.     [provider]  ciprofloxacin-dexamethasone (CIPRODEX) OTIC suspension Place 4 drops into the left ear 2 (two) times daily. Patient not taking: Reported on 11/20/2018 08/16/18   Garlon HatchetSanders, Lisa M, PA-C  clotrimazole (LOTRIMIN) 1 % cream Apply to affected area  2 times daily 10/13/17   Cathie HoopsYu, Amy V, PA-C  cyclobenzaprine (FLEXERIL) 10 MG tablet Take 0.5-1 tablets (5-10 mg total) by mouth 2 (two) times daily as needed. Patient not taking: Reported on 11/20/2018 06/29/17   Marlon PelGreene, Tiffany, PA-C  HYDROcodone-acetaminophen (NORCO/VICODIN) 5-325 MG tablet Take 1 tablet by mouth every 4 (four) hours as needed. Patient not taking: Reported on 11/20/2018 08/16/18   Garlon HatchetSanders, Lisa M, PA-C  ibuprofen (ADVIL,MOTRIN) 800 MG tablet Take 1 tablet (800 mg total) by mouth 3 (three) times daily. 09/01/18   Cathie HoopsYu, Amy V, PA-C  lidocaine (XYLOCAINE) 2 % solution Use as  directed 15 mLs in the mouth or throat as needed for mouth pain. Patient not taking: Reported on 11/20/2018 09/12/17   Maczis, Elmer SowMichael M, PA-C  meloxicam (MOBIC) 15 MG tablet Take 1 tablet (15 mg total) by mouth daily. Patient not taking: Reported on 11/20/2018 09/12/17   Maczis, Elmer SowMichael M, PA-C  naproxen (NAPROSYN) 375 MG tablet Take 1 tablet (375 mg total) by mouth 2 (two) times daily. Patient not taking: Reported on 11/20/2018 03/30/18   Rise MuLeaphart, Kenneth T, PA-C    Family History No family history on file.  Social History Social History   Tobacco Use  . Smoking status: Current Every Day Smoker    Packs/day: 0.00    Types: Cigars  . Smokeless tobacco: Never Used  Substance Use Topics  . Alcohol use: Yes  . Drug use: Yes    Types: Marijuana     Allergies   Patient has no known allergies.   Review of Systems Review of Systems   Physical Exam Triage Vital Signs ED Triage Vitals  Enc Vitals Group     BP 11/20/18 1732 120/75     Pulse Rate 11/20/18 1732 74     Resp 11/20/18 1732 16     Temp 11/20/18 1732 98.1 F (36.7 C)     Temp Source 11/20/18 1732 Oral     SpO2 11/20/18 1732 100 %     Weight --  Height --      Head Circumference --      Peak Flow --      Pain Score 11/20/18 1733 9     Pain Loc --      Pain Edu? --      Excl. in GC? --    No data found.  Updated Vital Signs BP 120/75 (BP Location: Left Arm)   Pulse 74   Temp 98.1 F (36.7 C) (Oral)   Resp 16   SpO2 100%   Visual Acuity Right Eye Distance:   Left Eye Distance:   Bilateral Distance:    Right Eye Near:   Left Eye Near:    Bilateral Near:     Physical Exam HENT:     Right Ear: There is impacted cerumen.     Left Ear: There is impacted cerumen.      UC Treatments / Results  Labs (all labs ordered are listed, but only abnormal results are displayed) Labs Reviewed  POCT URINALYSIS DIP (DEVICE)  URINE CYTOLOGY ANCILLARY ONLY    EKG None  Radiology No results  found.  Procedures Procedures (including critical care time)  Medications Ordered in UC Medications  cefTRIAXone (ROCEPHIN) injection 250 mg (has no administration in time range)  azithromycin (ZITHROMAX) tablet 1,000 mg (has no administration in time range)    Initial Impression / Assessment and Plan / UC Course  I have reviewed the triage vital signs and the nursing notes.  Pertinent labs & imaging results that were available during my care of the patient were reviewed by me and considered in my medical decision making (see chart for details).     Urine sent for cytology Treated prophylactically today for gonorrhea and chlamydia Remove earwax from both ears with flushing Follow up as needed for continued or worsening symptoms  Final Clinical Impressions(s) / UC Diagnoses   Final diagnoses:  Screening for STD (sexually transmitted disease)  Bilateral impacted cerumen     Discharge Instructions     We are sending your  urine for testing for STDs Lab results pending We will go ahead and treat you prophylactically as requested Both of your ears were impacted with wax we have flushed your ears for you Follow up as needed for continued or worsening symptoms     ED Prescriptions    None     Controlled Substance Prescriptions [place] Controlled Substance Registry consulted? Not Applicable   Janace Aris, NP 11/20/18 1811

## 2018-11-21 LAB — URINE CYTOLOGY ANCILLARY ONLY
Chlamydia: NEGATIVE
Neisseria Gonorrhea: NEGATIVE
TRICH (WINDOWPATH): NEGATIVE

## 2019-06-06 ENCOUNTER — Ambulatory Visit (HOSPITAL_COMMUNITY)
Admission: EM | Admit: 2019-06-06 | Discharge: 2019-06-06 | Disposition: A | Discharge status: Home / Self Care | Payer: Self-pay | Attending: Emergency Medicine | Admitting: Emergency Medicine

## 2019-06-06 ENCOUNTER — Encounter (HOSPITAL_COMMUNITY): Payer: Self-pay

## 2019-06-06 ENCOUNTER — Other Ambulatory Visit: Payer: Self-pay

## 2019-06-06 ENCOUNTER — Telehealth (HOSPITAL_COMMUNITY): Payer: Self-pay

## 2019-06-06 DIAGNOSIS — K0510 Chronic gingivitis, plaque induced: Secondary | ICD-10-CM

## 2019-06-06 DIAGNOSIS — K047 Periapical abscess without sinus: Secondary | ICD-10-CM

## 2019-06-06 MED ORDER — IBUPROFEN 600 MG PO TABS: 600.0000 mg | ORAL_TABLET | Freq: Four times a day (QID) | ORAL | 0 refills | Status: DC | PRN

## 2019-06-06 MED ORDER — AMOXICILLIN-POT CLAVULANATE 875-125 MG PO TABS: 1.0000 | ORAL_TABLET | Freq: Two times a day (BID) | ORAL | 0 refills | Status: AC

## 2019-06-06 MED ORDER — CHLORHEXIDINE GLUCONATE 0.12 % MT SOLN: OROMUCOSAL | 0 refills | Status: DC

## 2019-06-06 NOTE — ED Provider Notes (Signed)
HPI  SUBJECTIVE:  Howard Newman is a 38 y.o. male who presents with several weeks of lower dental pain and gingival pain.  Describes it as throbbing, constant.  He states that it is along the entire row of his bottom teeth and in his remaining right lower tooth.  He reports gingival abscesses.  He reports sensitivity to temperature and air and occasional temporal headaches.  No fevers, drooling, trismus, facial swelling, neck stiffness, swelling at the jaw.  No recent dental trauma.  He has tried ibuprofen 400 mg twice daily without improvement in his symptoms.  No aggravating factors.  His symptoms started after purchasing a vibrating toothbrush.  Past medical history negative for diabetes.  He is a smoker.  PMD: None.  Dentist: None.   History reviewed. No pertinent past medical history.  History reviewed. No pertinent surgical history.  Family History  Problem Relation Age of Onset  . Heart failure Mother   . Diabetes Mother   . Cancer Mother   . Healthy Father     Social History   Tobacco Use  . Smoking status: Current Some Day Smoker    Packs/day: 0.00    Types: Cigars  . Smokeless tobacco: Never Used  Substance Use Topics  . Alcohol use: Yes  . Drug use: Yes    Types: Marijuana    No current facility-administered medications for this encounter.   Current Outpatient Medications:  .  acetaminophen (TYLENOL) 500 MG tablet, Take 1,000 mg by mouth daily as needed for moderate pain. , Disp: , Rfl:  .  amoxicillin-clavulanate (AUGMENTIN) 875-125 MG tablet, Take 1 tablet by mouth 2 (two) times daily for 7 days., Disp: 14 tablet, Rfl: 0 .  chlorhexidine (PERIDEX) 0.12 % solution, 15 mL swish and spit bid, Disp: 480 mL, Rfl: 0 .  clotrimazole (LOTRIMIN) 1 % cream, Apply to affected area  2 times daily, Disp: 12 g, Rfl: 0 .  ibuprofen (ADVIL) 600 MG tablet, Take 1 tablet (600 mg total) by mouth every 6 (six) hours as needed., Disp: 30 tablet, Rfl: 0  No Known  Allergies   ROS  As noted in HPI.   Physical Exam  BP 132/88 (BP Location: Left Arm)   Pulse 82   Temp 98.5 F (36.9 C) (Oral)   Resp 17   SpO2 100%   Constitutional: Well developed, well nourished, no acute distress Eyes:  EOMI, conjunctiva normal bilaterally HENT: Normocephalic, atraumatic,mucus membranes moist.  No facial swelling.  No trismus.  No swelling under the tongue.  Teeth numbers 25, 26, 28 intact but tender to palpation.  Gingiva inflamed, erythematous.  No gingival abscesses.  No ulcers.  No expressible purulent drainage.  Tooth 27 absent. Neck: No submental, cervical lymphadenopathy.  No swelling underneath the jaw. Respiratory: Normal inspiratory effort Cardiovascular: Normal rate GI: nondistended skin: No rash, skin intact Musculoskeletal: no deformities Neurologic: Alert & oriented x 3, no focal neuro deficits Psychiatric: Speech and behavior appropriate   ED Course   Medications - No data to display  No orders of the defined types were placed in this encounter.   No results found for this or any previous visit (from the past 24 hour(s)). No results found.  ED Clinical Impression  1. Dental infection   2. Gingivitis      ED Assessment/Plan  Patient with a gingivitis.  Gums are inflamed, irritated.  His remaining teeth appear to be in relatively good condition, however they are tender to palpation particularly tooth #28.  He may have a dental abscess. Pt Declined dental block.  Will send home with Augmentin, ibuprofen 600 mg combined with 1 g of Tylenol 3-4 times a day as needed for pain, Peridex mouthwash, and if this too expensive, Listerine.  Will provide dental list and primary care list for ongoing care.  Advised soft toothbrush.   Meds ordered this encounter  Medications  . chlorhexidine (PERIDEX) 0.12 % solution    Sig: 15 mL swish and spit bid    Dispense:  480 mL    Refill:  0  . ibuprofen (ADVIL) 600 MG tablet    Sig: Take 1 tablet  (600 mg total) by mouth every 6 (six) hours as needed.    Dispense:  30 tablet    Refill:  0  . amoxicillin-clavulanate (AUGMENTIN) 875-125 MG tablet    Sig: Take 1 tablet by mouth 2 (two) times daily for 7 days.    Dispense:  14 tablet    Refill:  0    *This clinic note was created using Scientist, clinical (histocompatibility and immunogenetics)Dragon dictation software. Therefore, there may be occasional mistakes despite careful proofreading.   ?   Domenick GongMortenson, Yordan Martindale, MD 06/06/19 [phone]

## 2019-06-06 NOTE — Discharge Instructions (Addendum)
Finish the Augmentin which are antibiotics for a dental infection, ibuprofen 600 mg combined with 1 g of Tylenol 3-4 times a day as needed for pain, Peridex mouthwash, and if this too expensive, Listerine.  Stop using your electric toothbrush.  Start using a soft toothbrush.    Look up the [place] [facility] for free dental clinics. undoomedical.com.asp  Get there early and be prepared to wait. [facility] and [employer] have Copywriter, advertising schools that provide low cost routine dental care.   Other resources: [facility] [place], [place] [phone]  Patients with Medicaid: [place] W[address]ilda Cisco Phone:  [phone]                                                  Phone:  [phone]  Dr. Ardyth Harps [address] [phone]  If unable to pay or uninsured, contact:  [place] or [facility]. to become qualified for the adult dental clinic.  No matter what dental problem you have, it will not get better unless you get good dental care.  If the tooth is not taken care of, your symptoms will come back in time and you will be visiting [place] again in the Urgent [place] with a bad toothache.  So, see your dentist as soon as possible.  If you don't have a dentist, we can give you a list of dentists.  Sometimes the most cost effective treatment is removal of the tooth.  This can be done very inexpensively through one of the low cost Dance movement psychotherapist such as the facility on [facility] in [place] [phone]).  The downside to this is that you will have one less tooth and this can effect your ability to chew.  Some other things that can be done for a dental infection include the following:  Rinse your mouth out with hot salt water (1/2 tsp of table salt and a  pinch of baking soda in 8 oz of hot water).  You can do this every 2 or 3 hours. Avoid cold foods, beverages, and cold air.  This will make your symptoms worse. Sleep with your head elevated.  Sleeping flat will cause your gums and oral tissues to swell and make them hurt more.  You can sleep on several pillows.  Even better is to sleep in a recliner with your head higher than your heart. For mild to moderate pain, you can take Tylenol, ibuprofen, or Aleve. External application of heat by a heating pad, hot water bottle, or hot wet towel can help with pain and speed healing.  You can do this every 2 to 3 hours. Do not fall asleep on a heating pad since this can cause a burn.   Go to www.goodrx.com to look up your medications. This will give you a list of where you can find your prescriptions at the most affordable prices. Or ask the pharmacist what the cash price  is, or if they have any other discount programs available to help make your medication more affordable. This can be less expensive than what you would pay with insurance.    Below is a list of primary care practices who are taking new patients for you to follow-up with.  [facility] [address] [place], [place] [address] [phone]  [facility] [address] [place], [place] [address] [phone]  [facility] [phone] [address] [place] [address]  [facility]: [address] [place] [address]  [phone]  [facility]: [address] [place] [id number]   [phone]  [facility]: [address] [place] [address]  [phone]  [place] [facility] : [address] [place] [address] [phone]  [facility]: [address] [place] [phone]  [facility]: [address] [place] [address] [phone]  Dr. Oneal GroutMahima Pandey [address] [facility] [place] [address]  [phone]  Dr. Jackie PlumGeorge Osei-Bonsu, [facility]. [address] [place], [place] [address]  [phone]  Go to www.goodrx.com to look up your medications. This will give you a list of where you can find your prescriptions at the most affordable prices. Or ask the pharmacist what the cash price is, or if they have any other discount programs available to help make your medication more affordable. This can be less expensive than what you would pay with insurance.

## 2019-06-06 NOTE — ED Triage Notes (Signed)
Patient presents to Urgent Care with complaints of abscesses/wounds on his lower gums when he brushes his teeth since several weeks ago. Patient reports he has been taking tylenol and ibuprofen but it has not helped.

## 2019-08-11 ENCOUNTER — Ambulatory Visit (HOSPITAL_COMMUNITY)
Admission: EM | Admit: 2019-08-11 | Discharge: 2019-08-11 | Disposition: A | Discharge status: Home / Self Care | Payer: Self-pay | Attending: Emergency Medicine | Admitting: Emergency Medicine

## 2019-08-11 ENCOUNTER — Encounter (HOSPITAL_COMMUNITY): Payer: Self-pay

## 2019-08-11 DIAGNOSIS — K0889 Other specified disorders of teeth and supporting structures: Secondary | ICD-10-CM

## 2019-08-11 DIAGNOSIS — H6123 Impacted cerumen, bilateral: Secondary | ICD-10-CM

## 2019-08-11 MED ORDER — CARBAMIDE PEROXIDE 6.5 % OT SOLN: 5.0000 [drp] | Freq: Two times a day (BID) | OTIC | 0 refills | Status: AC

## 2019-08-11 MED ORDER — NEOMYCIN-POLYMYXIN-HC 3.5-10000-1 OT SUSP: 4.0000 [drp] | Freq: Three times a day (TID) | OTIC | 0 refills | Status: AC

## 2019-08-11 MED ORDER — IBUPROFEN 800 MG PO TABS: 800.0000 mg | ORAL_TABLET | Freq: Three times a day (TID) | ORAL | 0 refills | Status: DC

## 2019-08-11 MED ORDER — AMOXICILLIN 500 MG PO CAPS: 500.0000 mg | ORAL_CAPSULE | Freq: Three times a day (TID) | ORAL | 0 refills | Status: AC

## 2019-08-11 NOTE — ED Triage Notes (Signed)
Pt states having dental pain x 1 week, the pain is worse at night. Pt states he is feeling fullness in his ears x 1 week.

## 2019-08-11 NOTE — Discharge Instructions (Addendum)
Begin amoxicillin twice daily to treat dental infection Use anti-inflammatories for pain/swelling. You may take up to 800 mg Ibuprofen every 8 hours with food. You may supplement Ibuprofen with Tylenol [phone] mg every 8 hours.  Read attached and follow up with dentistry  Wax blocking ears Some removed, still present Continue using debrox twice daily Cortisporin ear drops x 5 days to prevent infection from developing  Follow up if symptoms not resolving or wrosening

## 2019-08-11 NOTE — ED Provider Notes (Signed)
[place]    CSN: [id number] Arrival date & time: 08/11/19  1109      History   Chief Complaint Chief Complaint  Patient presents with  . Dental Pain  . Ear Fullness    HPI SAMAD THON is a 38 y.o. male no significant past medical history presenting today for evaluation of dental pain and ear pain.  Patient states that over the past week he has had decreased hearing in both of his ears.  He has had an associated fullness sensation.  He has tried applying sweet oil without relief.  He denies a significant associated pain.  Denies fevers.  Denies associated URI symptoms of congestion cough or sore throat.  He has also been having dental pain most prominently to his right lower jaw.  This has been going on for a long period of time, but recently has become more painful.  Denies any swelling, but notes that he has tenderness to his gums around the teeth.  Denies neck stiffness or neck swelling.  He has been using over-the-counter ibuprofen and Tylenol for pain.  HPI  History reviewed. No pertinent past medical history.  There are no active problems to display for this patient.   History reviewed. No pertinent surgical history.     Home Medications    Prior to Admission medications   Medication Sig Start Date End Date Taking? Authorizing Provider  acetaminophen (TYLENOL) 500 MG tablet Take 1,000 mg by mouth daily as needed for moderate pain.     [provider]  amoxicillin (AMOXIL) 500 MG capsule Take 1 capsule (500 mg total) by mouth 3 (three) times daily for 7 days. 08/11/19 08/18/19  Onie Kasparek C, PA-C  carbamide peroxide (DEBROX) 6.5 % OTIC solution Place 5 drops into both ears 2 (two) times daily for 4 days. 08/11/19 08/15/19  Zuly Belkin C, PA-C  ibuprofen (ADVIL) 800 MG tablet Take 1 tablet (800 mg total) by mouth 3 (three) times daily. 08/11/19   Kayelee Herbig C, PA-C  neomycin-polymyxin-hydrocortisone (CORTISPORIN) 3.5-10000-1 OTIC  suspension Place 4 drops into both ears 3 (three) times daily for 5 days. 08/11/19 08/16/19  Saga Balthazar, Elesa Hacker, PA-C    Family History Family History  Problem Relation Age of Onset  . Heart failure Mother   . Diabetes Mother   . Cancer Mother   . Healthy Father     Social History Social History   Tobacco Use  . Smoking status: Current Some Day Smoker    Packs/day: 0.00    Types: Cigars  . Smokeless tobacco: Never Used  Substance Use Topics  . Alcohol use: Yes  . Drug use: Yes    Types: Marijuana     Allergies   Patient has no known allergies.   Review of Systems Review of Systems  Constitutional: Negative for activity change, appetite change, chills, fatigue and fever.  HENT: Positive for dental problem and hearing loss. Negative for congestion, ear pain, rhinorrhea, sinus pressure, sore throat and trouble swallowing.   Eyes: Negative for discharge and redness.  Respiratory: Negative for cough, chest tightness and shortness of breath.   Cardiovascular: Negative for chest pain.  Gastrointestinal: Negative for abdominal pain, diarrhea, nausea and vomiting.  Musculoskeletal: Negative for myalgias.  Skin: Negative for rash.  Neurological: Negative for dizziness, light-headedness and headaches.     Physical Exam Triage Vital Signs ED Triage Vitals  Enc Vitals Group     BP 08/11/19 1140 130/75     Pulse Rate  08/11/19 1140 86     Resp 08/11/19 1140 16     Temp 08/11/19 1140 98.2 F (36.8 C)     Temp Source 08/11/19 1140 Oral     SpO2 08/11/19 1140 100 %     Weight --      Height --      Head Circumference --      Peak Flow --      Pain Score 08/11/19 1138 10     Pain Loc --      Pain Edu? --      Excl. in GC? --    No data found.  Updated Vital Signs BP 130/75 (BP Location: Right Arm)   Pulse 86   Temp 98.2 F (36.8 C) (Oral)   Resp 16   SpO2 100%   Visual Acuity Right Eye Distance:   Left Eye Distance:   Bilateral Distance:    Right Eye Near:    Left Eye Near:    Bilateral Near:     Physical Exam Vitals signs and nursing note reviewed.  Constitutional:      Appearance: He is well-developed.  HENT:     Head: Normocephalic and atraumatic.     Comments: No obvious facial swelling    Ears:     Comments: Bilateral ears with cerumen impaction, unable to visualize TMs  Cerumen impaction improved partially, still unable to visualize TMs after irrigation.  Right canal with small areas of irritated skin    Mouth/Throat:     Comments: All dentition missing from upper jaw, front for and isolated tooth on right lower jaw present, surrounding gingival erythema and tenderness  No soft palate swelling, uvula midline, posterior pharynx patent Eyes:     Conjunctiva/sclera: Conjunctivae normal.  Neck:     Musculoskeletal: Neck supple.     Comments: Full active range of motion of neck, no overlying neck swelling or erythema Cardiovascular:     Rate and Rhythm: Normal rate and regular rhythm.     Heart sounds: No murmur.  Pulmonary:     Effort: Pulmonary effort is normal. No respiratory distress.     Breath sounds: Normal breath sounds.  Abdominal:     Palpations: Abdomen is soft.     Tenderness: There is no abdominal tenderness.  Skin:    General: Skin is warm and dry.  Neurological:     Mental Status: He is alert.      UC Treatments / Results  Labs (all labs ordered are listed, but only abnormal results are displayed) Labs Reviewed - No data to display  EKG   Radiology No results found.  Procedures Procedures (including critical care time)  Medications Ordered in UC Medications - No data to display  Initial Impression / Assessment and Plan / UC Course  I have reviewed the triage vital signs and the nursing notes.  Pertinent labs & imaging results that were available during my care of the patient were reviewed by me and considered in my medical decision making (see chart for details).     Partial removal of  cerumen impaction by nursing staff via irrigation.  Provide Debrox to use at home to further help break down wax within the ears.  Did have some irritated/raw skin after irrigation, will provide Cortisporin to help prevent infection from developing.  Patient with dental pain, putting on amoxicillin to cover for infection, Tylenol and ibuprofen for pain, provided dental resources to follow-up with dentistry as this is a recurrent issue and  patient was seen here 2 months ago for similar.  Discussed strict return precautions. Patient verbalized understanding and is agreeable with plan.  Final Clinical Impressions(s) / UC Diagnoses   Final diagnoses:  Pain, dental  Bilateral impacted cerumen     Discharge Instructions     Begin amoxicillin twice daily to treat dental infection Use anti-inflammatories for pain/swelling. You may take up to 800 mg Ibuprofen every 8 hours with food. You may supplement Ibuprofen with Tylenol [phone] mg every 8 hours.  Read attached and follow up with dentistry  Wax blocking ears Some removed, still present Continue using debrox twice daily Cortisporin ear drops x 5 days to prevent infection from developing  Follow up if symptoms not resolving or wrosening    ED Prescriptions    Medication Sig Dispense Auth. Provider   amoxicillin (AMOXIL) 500 MG capsule Take 1 capsule (500 mg total) by mouth 3 (three) times daily for 7 days. 21 capsule Kayan Blissett C, PA-C   ibuprofen (ADVIL) 800 MG tablet Take 1 tablet (800 mg total) by mouth 3 (three) times daily. 21 tablet Nohealani Medinger C, PA-C   carbamide peroxide (DEBROX) 6.5 % OTIC solution Place 5 drops into both ears 2 (two) times daily for 4 days. 15 mL Tamekia Rotter C, PA-C   neomycin-polymyxin-hydrocortisone (CORTISPORIN) 3.5-10000-1 OTIC suspension Place 4 drops into both ears 3 (three) times daily for 5 days. 10 mL Oddie Bottger, CashHallie C, PA-C     PDMP not reviewed this encounter.   Makaria Poarch, ToledoHallie C,  PA-C 08/11/19 1249

## 2019-09-17 ENCOUNTER — Other Ambulatory Visit: Payer: Self-pay

## 2019-09-17 ENCOUNTER — Ambulatory Visit
Admission: EM | Admit: 2019-09-17 | Discharge: 2019-09-17 | Disposition: A | Discharge status: Home / Self Care | Payer: Self-pay | Attending: Physician Assistant | Admitting: Physician Assistant

## 2019-09-17 DIAGNOSIS — K0889 Other specified disorders of teeth and supporting structures: Secondary | ICD-10-CM

## 2019-09-17 MED ORDER — NAPROXEN 500 MG PO TABS: 500.0000 mg | ORAL_TABLET | Freq: Two times a day (BID) | ORAL | 0 refills | Status: AC

## 2019-09-17 MED ORDER — CHLORHEXIDINE GLUCONATE 0.12 % MT SOLN: 10.0000 mL | Freq: Two times a day (BID) | OROMUCOSAL | 0 refills | Status: DC

## 2019-09-17 MED ORDER — AMOXICILLIN-POT CLAVULANATE 875-125 MG PO TABS: 1.0000 | ORAL_TABLET | Freq: Two times a day (BID) | ORAL | 0 refills | Status: DC

## 2019-09-17 NOTE — ED Triage Notes (Signed)
Pt c/o rt lower tooth ache for 4wks

## 2019-09-17 NOTE — Discharge Instructions (Signed)
Start Augmentin as directed for dental infection. Naproxen for pain. Follow up with dentist for further treatment and evaluation. If experiencing swelling of the throat, trouble breathing, trouble swallowing, leaning forward to breath, drooling, go to the emergency department for further evaluation.

## 2019-09-17 NOTE — ED Provider Notes (Signed)
[name]    CSN: [id number] Arrival date & time: 09/17/19  1504      History   Chief Complaint Chief Complaint  Patient presents with  . Dental Pain    HPI Howard Newman is a 38 y.o. male.   38 year old male comes in for 4-week history of right lower dental pain.  States has had many dental problems in the past, but currently waiting for insurance to see a dentist.  For the past 4 weeks, has had intermittent pain with gum swelling to the right lower jaw.  Denies facial swelling.  Denies fever, chills, body aches.  Denies swelling of the throat, tripoding, drooling, trismus. Has not taken anything for the symptoms.      History reviewed. No pertinent past medical history.  There are no active problems to display for this patient.   History reviewed. No pertinent surgical history.     Home Medications    Prior to Admission medications   Medication Sig Start Date End Date Taking? Authorizing Provider  acetaminophen (TYLENOL) 500 MG tablet Take 1,000 mg by mouth daily as needed for moderate pain.     [provider]  amoxicillin-clavulanate (AUGMENTIN) 875-125 MG tablet Take 1 tablet by mouth every 12 (twelve) hours. 09/17/19   Tasia Catchings,  V, PA-C  chlorhexidine (PERIDEX) 0.12 % solution Use as directed 10 mLs in the mouth or throat 2 (two) times daily. 09/17/19   Tasia Catchings,  V, PA-C  naproxen (NAPROSYN) 500 MG tablet Take 1 tablet (500 mg total) by mouth 2 (two) times daily for 10 days. 09/17/19 09/27/19  Ok Edwards, PA-C    Family History Family History  Problem Relation Age of Onset  . Heart failure Mother   . Diabetes Mother   . Cancer Mother   . Healthy Father     Social History Social History   Tobacco Use  . Smoking status: Current Some Day Smoker    Packs/day: 0.00    Types: Cigars  . Smokeless tobacco: Never Used  Substance Use Topics  . Alcohol use: Yes  . Drug use: Yes    Types: Marijuana     Allergies   Patient has no known  allergies.   Review of Systems Review of Systems  Reason unable to perform ROS: See HPI as above.     Physical Exam Triage Vital Signs ED Triage Vitals  Enc Vitals Group     BP 09/17/19 1601 (!) 157/95     Pulse Rate 09/17/19 1601 82     Resp 09/17/19 1601 16     Temp 09/17/19 1601 98.1 F (36.7 C)     Temp Source 09/17/19 1601 Oral     SpO2 09/17/19 1601 96 %     Weight --      Height --      Head Circumference --      Peak Flow --      Pain Score 09/17/19 1602 9     Pain Loc --      Pain Edu? --      Excl. in [place]? --    No data found.  Updated Vital Signs BP (!) 157/95 (BP Location: Left Arm)   Pulse 82   Temp 98.1 F (36.7 C) (Oral)   Resp 16   SpO2 96%   Physical Exam Constitutional:      General: He is not in acute distress.    Appearance: He is well-developed. He is not ill-appearing, toxic-appearing or  diaphoretic.  HENT:     Head: Normocephalic and atraumatic.     Jaw: No trismus.     Mouth/Throat:     Mouth: Mucous membranes are moist.     Pharynx: Oropharynx is clear. Uvula midline. No uvula swelling.     Tonsils: No tonsillar exudate.     Comments: All dentition missing from upper jaw. Right bottom posterior molars missing as well. Gum swelling to right bottom jaw with tenderness to palpation. No fluctuance felt. Right frontal teeth with some tenderness to palpation.   Floor of mouth soft to palpation. No facial swelling.  Neck:     Musculoskeletal: Normal range of motion and neck supple.  Pulmonary:     Effort: Pulmonary effort is normal. No respiratory distress.  Skin:    General: Skin is warm and dry.  Neurological:     Mental Status: He is alert and oriented to person, place, and time.      UC Treatments / Results  Labs (all labs ordered are listed, but only abnormal results are displayed) Labs Reviewed - No data to display  EKG   Radiology No results found.  Procedures Procedures (including critical care time)  Medications  Ordered in UC Medications - No data to display  Initial Impression / Assessment and Plan / UC Course  I have reviewed the triage vital signs and the nursing notes.  Pertinent labs & imaging results that were available during my care of the patient were reviewed by me and considered in my medical decision making (see chart for details).    Start antibiotics for possible dental infection. Symptomatic treatment as needed. Discussed with patient symptoms can return if dental problem is not addressed. Follow up with dentist for further evaluation and treatment of dental pain. Resources given. Return precautions given.   Final Clinical Impressions(s) / UC Diagnoses   Final diagnoses:  Pain, dental   ED Prescriptions    Medication Sig Dispense Auth. Provider   naproxen (NAPROSYN) 500 MG tablet Take 1 tablet (500 mg total) by mouth 2 (two) times daily for 10 days. 20 tablet ,  V, PA-C   amoxicillin-clavulanate (AUGMENTIN) 875-125 MG tablet Take 1 tablet by mouth every 12 (twelve) hours. 14 tablet ,  V, PA-C   chlorhexidine (PERIDEX) 0.12 % solution Use as directed 10 mLs in the mouth or throat 2 (two) times daily. 240 mL Belinda Fisher, PA-C     PDMP not reviewed this encounter.   Belinda Fisher, PA-C 09/17/19 1707

## 2020-03-11 ENCOUNTER — Ambulatory Visit (HOSPITAL_COMMUNITY)
Admission: EM | Admit: 2020-03-11 | Discharge: 2020-03-11 | Disposition: A | Discharge status: Home / Self Care | Payer: Self-pay | Attending: Family Medicine | Admitting: Family Medicine

## 2020-03-11 ENCOUNTER — Encounter (HOSPITAL_COMMUNITY): Payer: Self-pay

## 2020-03-11 ENCOUNTER — Other Ambulatory Visit: Payer: Self-pay

## 2020-03-11 DIAGNOSIS — B353 Tinea pedis: Secondary | ICD-10-CM

## 2020-03-11 DIAGNOSIS — K0889 Other specified disorders of teeth and supporting structures: Secondary | ICD-10-CM

## 2020-03-11 MED ORDER — PENICILLIN V POTASSIUM 500 MG PO TABS: 500.0000 mg | ORAL_TABLET | Freq: Three times a day (TID) | ORAL | 0 refills | Status: DC

## 2020-03-11 MED ORDER — CLOTRIMAZOLE-BETAMETHASONE 1-0.05 % EX CREA: TOPICAL_CREAM | CUTANEOUS | 0 refills | Status: DC

## 2020-03-11 MED ORDER — IBUPROFEN 800 MG PO TABS: 800.0000 mg | ORAL_TABLET | Freq: Three times a day (TID) | ORAL | 0 refills | Status: DC

## 2020-03-11 NOTE — ED Triage Notes (Signed)
C/o rash on left foot for about 1-2 months. Also c/o right sided dental pain that is causing migraines.

## 2020-03-11 NOTE — ED Provider Notes (Signed)
[facility]   [id number] 03/11/20 Arrival Time: 1105  ASSESSMENT & PLAN:  1. Pain, dental   2. Athlete's foot on left     No sign of abscess requiring I&D at this time. Discussed.  Meds ordered this encounter  Medications  . penicillin v potassium (VEETID) 500 MG tablet    Sig: Take 1 tablet (500 mg total) by mouth 3 (three) times daily.    Dispense:  30 tablet    Refill:  0  . clotrimazole-betamethasone (LOTRISONE) cream    Sig: Apply to affected area 2 times daily for up to two weeks.    Dispense:  45 g    Refill:  0  . ibuprofen (ADVIL) 800 MG tablet    Sig: Take 1 tablet (800 mg total) by mouth 3 (three) times daily with meals.    Dispense:  21 tablet    Refill:  0    Keep feet dry. Plans on dental evaluation; h/o needed tooth extractions.  Reviewed expectations re: course of current medical issues. Questions answered. Outlined signs and symptoms indicating need for more acute intervention. Patient verbalized understanding. After Visit Summary given.   SUBJECTIVE:  Howard Newman is a 39 y.o. male who reports gradual onset of right lower dental pain described as aching/throbbing. Present for the past several days. Fever: absent. Does report associated headache. Tolerating PO intake but reports pain with chewing. Normal swallowing. He does not see a dentist regularly. No neck swelling or pain. OTC analgesics without relief.  Also reports long-standing itchy rash of left foot. OTC creams without relief. Feet sweat a lot at work. No associated pain.  OBJECTIVE: Vitals:   03/11/20 1131  BP: 132/75  Pulse: 82  Resp: 14  Temp: 98.4 F (36.9 C)  TempSrc: Oral  SpO2: 100%    General appearance: alert; no distress HENT: normocephalic; atraumatic; dentition: poor; right lower gums without areas of fluctuance, drainage, or bleeding and with tenderness to palpation; normal jaw movement without difficulty Neck: supple without LAD; FROM; trachea  midline Lungs: normal respirations; unlabored; speaks full sentences without difficulty Skin: warm and dry; diffuse hyperkeratotic eruption involving the medial and lateral surfaces of his left foot; some underlying erythema Psychological: alert and cooperative; normal mood and affect  No Known Allergies  History reviewed. No pertinent past medical history.   Social History   Socioeconomic History  . Marital status: Single    Spouse name: Not on file  . Number of children: Not on file  . Years of education: Not on file  . Highest education level: Not on file  Occupational History  . Not on file  Tobacco Use  . Smoking status: Current Some Day Smoker    Packs/day: 0.00    Types: Cigars  . Smokeless tobacco: Never Used  Substance and Sexual Activity  . Alcohol use: Yes  . Drug use: Yes    Types: Marijuana  . Sexual activity: Not on file  Other Topics Concern  . Not on file  Social History Narrative  . Not on file   Social Determinants of Health   Financial Resource Strain:   . Difficulty of Paying Living Expenses:   Food Insecurity:   . Worried About Programme researcher, broadcasting/film/video in the Last Year:   . Barista in the Last Year:   Transportation Needs:   . Freight forwarder (Medical):   Marland Kitchen Lack of Transportation (Non-Medical):   Physical Activity:   . Days of Exercise  per Week:   . Minutes of Exercise per Session:   Stress:   . Feeling of Stress :   Social Connections:   . Frequency of Communication with Friends and Family:   . Frequency of Social Gatherings with Friends and Family:   . Attends Religious Services:   . Active Member of Clubs or Organizations:   . Attends Archivist Meetings:   Marland Kitchen Marital Status:   Intimate Partner Violence:   . Fear of Current or Ex-Partner:   . Emotionally Abused:   Marland Kitchen Physically Abused:   . Sexually Abused:    Family History  Problem Relation Age of Onset  . Heart failure Mother   . Diabetes Mother   . Cancer  Mother   . Healthy Father    History reviewed. No pertinent surgical history.   Vanessa Kick, MD 03/11/20 1306

## 2020-04-07 ENCOUNTER — Encounter (HOSPITAL_COMMUNITY): Payer: Self-pay | Admitting: Emergency Medicine

## 2020-04-07 ENCOUNTER — Ambulatory Visit (HOSPITAL_COMMUNITY)
Admission: EM | Admit: 2020-04-07 | Discharge: 2020-04-07 | Disposition: A | Discharge status: Home / Self Care | Payer: Self-pay | Attending: Internal Medicine | Admitting: Internal Medicine

## 2020-04-07 ENCOUNTER — Other Ambulatory Visit: Payer: Self-pay

## 2020-04-07 DIAGNOSIS — M62830 Muscle spasm of back: Secondary | ICD-10-CM

## 2020-04-07 MED ORDER — IBUPROFEN 600 MG PO TABS: 600.0000 mg | ORAL_TABLET | Freq: Four times a day (QID) | ORAL | 0 refills | Status: DC | PRN

## 2020-04-07 MED ORDER — METHOCARBAMOL 500 MG PO TABS: 500.0000 mg | ORAL_TABLET | Freq: Two times a day (BID) | ORAL | 0 refills | Status: DC
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
## 2020-05-02 ENCOUNTER — Other Ambulatory Visit: Payer: Self-pay

## 2020-05-02 ENCOUNTER — Emergency Department (HOSPITAL_COMMUNITY): Payer: No Typology Code available for payment source

## 2020-05-02 ENCOUNTER — Emergency Department (HOSPITAL_COMMUNITY)
Admission: EM | Admit: 2020-05-02 | Discharge: 2020-05-02 | Disposition: A | Discharge status: Home / Self Care | Payer: No Typology Code available for payment source | Attending: Emergency Medicine | Admitting: Emergency Medicine

## 2020-05-02 DIAGNOSIS — F17290 Nicotine dependence, other tobacco product, uncomplicated: Secondary | ICD-10-CM | POA: Diagnosis not present

## 2020-05-02 DIAGNOSIS — Z23 Encounter for immunization: Secondary | ICD-10-CM | POA: Insufficient documentation

## 2020-05-02 DIAGNOSIS — F10920 Alcohol use, unspecified with intoxication, uncomplicated: Secondary | ICD-10-CM

## 2020-05-02 DIAGNOSIS — F10129 Alcohol abuse with intoxication, unspecified: Secondary | ICD-10-CM | POA: Diagnosis not present

## 2020-05-02 LAB — URINALYSIS, ROUTINE W REFLEX MICROSCOPIC
Bacteria, UA: NONE SEEN
Bilirubin Urine: NEGATIVE
Glucose, UA: NEGATIVE mg/dL
Ketones, ur: NEGATIVE mg/dL
Leukocytes,Ua: NEGATIVE
Nitrite: NEGATIVE
Protein, ur: NEGATIVE mg/dL
Specific Gravity, Urine: 1.004 — ABNORMAL LOW (ref 1.005–1.030)
pH: 6 (ref 5.0–8.0)

## 2020-05-02 LAB — COMPREHENSIVE METABOLIC PANEL
ALT: 36 U/L (ref 0–44)
AST: 42 U/L — ABNORMAL HIGH (ref 15–41)
Albumin: 3.8 g/dL (ref 3.5–5.0)
Alkaline Phosphatase: 40 U/L (ref 38–126)
Anion gap: 13 (ref 5–15)
BUN: 15 mg/dL (ref 6–20)
CO2: 23 mmol/L (ref 22–32)
Calcium: 8.5 mg/dL — ABNORMAL LOW (ref 8.9–10.3)
Chloride: 103 mmol/L (ref 98–111)
Creatinine, Ser: 1.12 mg/dL (ref 0.61–1.24)
GFR calc Af Amer: >60 mL/min (ref >60)
GFR calc non Af Amer: >60 mL/min (ref >60)
Glucose, Bld: 96 mg/dL (ref 70–99)
Potassium: 3.5 mmol/L (ref 3.5–5.1)
Sodium: 139 mmol/L (ref 135–145)
Total Bilirubin: 0.5 mg/dL (ref 0.3–1.2)
Total Protein: 7.2 g/dL (ref 6.5–8.1)

## 2020-05-02 LAB — CBC WITH DIFFERENTIAL/PLATELET
Abs Immature Granulocytes: 0.01 10*3/uL (ref 0.00–0.07)
Basophils Absolute: 0.1 10*3/uL (ref 0.0–0.1)
Basophils Relative: 2 %
Eosinophils Absolute: 0.1 10*3/uL (ref 0.0–0.5)
Eosinophils Relative: 2 %
HCT: 39.7 % (ref 39.0–52.0)
Hemoglobin: 12.9 g/dL — ABNORMAL LOW (ref 13.0–17.0)
Immature Granulocytes: 0 %
Lymphocytes Relative: 28 %
Lymphs Abs: 1.5 10*3/uL (ref 0.7–4.0)
MCH: 31.9 pg (ref 26.0–34.0)
MCHC: 32.5 g/dL (ref 30.0–36.0)
MCV: 98 fL (ref 80.0–100.0)
Monocytes Absolute: 0.4 10*3/uL (ref 0.1–1.0)
Monocytes Relative: 8 %
Neutro Abs: 3.1 10*3/uL (ref 1.7–7.7)
Neutrophils Relative %: 60 %
Platelets: 357 10*3/uL (ref 150–400)
RBC: 4.05 MIL/uL — ABNORMAL LOW (ref 4.22–5.81)
RDW: 13.1 % (ref 11.5–15.5)
WBC: 5.2 10*3/uL (ref 4.0–10.5)
nRBC: 0 % (ref 0.0–0.2)

## 2020-05-02 LAB — TROPONIN I (HIGH SENSITIVITY)
Troponin I (High Sensitivity): 6 ng/L (ref <18)
Troponin I (High Sensitivity): 7 ng/L (ref <18)

## 2020-05-02 LAB — PROTIME-INR
INR: 0.9 (ref 0.8–1.2)
Prothrombin Time: 11.9 seconds (ref 11.4–15.2)

## 2020-05-02 LAB — RAPID URINE DRUG SCREEN, HOSP PERFORMED
Amphetamines: NOT DETECTED
Barbiturates: NOT DETECTED
Benzodiazepines: NOT DETECTED
Cocaine: NOT DETECTED
Opiates: NOT DETECTED
Tetrahydrocannabinol: POSITIVE — AB

## 2020-05-02 LAB — ETHANOL: Alcohol, Ethyl (B): 399 mg/dL (ref <10)

## 2020-05-02 MED ORDER — TETANUS-DIPHTH-ACELL PERTUSSIS 5-2.5-18.5 LF-MCG/0.5 IM SUSP: 0.5000 mL | Freq: Once | INTRAMUSCULAR | Status: DC

## 2020-05-02 NOTE — ED Notes (Signed)
Pt took c-collar off. Also took off all monitoring devices at this time.
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
## 2020-05-02 NOTE — ED Notes (Signed)
Pt combative and non-compliant. Pt refusing to give urine sample

## 2020-05-02 NOTE — ED Notes (Signed)
Pt refused Tdap.
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
## 2020-05-15 ENCOUNTER — Ambulatory Visit (HOSPITAL_COMMUNITY)
Admission: EM | Admit: 2020-05-15 | Discharge: 2020-05-15 | Disposition: A | Discharge status: Home / Self Care | Payer: Self-pay | Attending: Family Medicine | Admitting: Family Medicine

## 2020-05-15 ENCOUNTER — Encounter (HOSPITAL_COMMUNITY): Payer: Self-pay

## 2020-05-15 ENCOUNTER — Other Ambulatory Visit: Payer: Self-pay

## 2020-05-15 DIAGNOSIS — K0889 Other specified disorders of teeth and supporting structures: Secondary | ICD-10-CM

## 2020-05-15 DIAGNOSIS — L738 Other specified follicular disorders: Secondary | ICD-10-CM

## 2020-05-15 DIAGNOSIS — K047 Periapical abscess without sinus: Secondary | ICD-10-CM

## 2020-05-15 DIAGNOSIS — R21 Rash and other nonspecific skin eruption: Secondary | ICD-10-CM

## 2020-05-15 MED ORDER — TRIAMCINOLONE ACETONIDE 0.1 % EX CREA
1.0000 | TOPICAL_CREAM | Freq: Two times a day (BID) | CUTANEOUS | 0 refills | Status: DC
Start: 2020-05-15 — End: 2020-09-12

## 2020-05-15 MED ORDER — AMOXICILLIN-POT CLAVULANATE 875-125 MG PO TABS: 1.0000 | ORAL_TABLET | Freq: Two times a day (BID) | ORAL | 0 refills | Status: AC

## 2020-05-15 NOTE — ED Triage Notes (Signed)
Pt presents with generalized dental pain for about 2 weeks.

## 2020-05-15 NOTE — ED Provider Notes (Signed)
[facility]   [id number] 05/15/20 Arrival Time: 0901  CC: DENTAL pain  SUBJECTIVE:  Howard Newman is a 39 y.o. male who presents with dental pain x 5 days. Reports that he has hx dental infections. Denies a precipitating event or trauma. Localizes pain to right lower jaw.. Has tried OTC analgesics without relief. Worse with chewing. Does not see a dentist regularly. Reports similar symptoms in the past. Also reports razor bumps from shaving genital area. Has been using neosporin on this with little relief. Denies fever, chills, dysphagia, odynophagia, oral or neck swelling, nausea, vomiting, chest pain, SOB.    ROS: As per HPI.  All other pertinent ROS negative.     History reviewed. No pertinent past medical history. History reviewed. No pertinent surgical history. No Known Allergies No current facility-administered medications on file prior to encounter.   Current Outpatient Medications on File Prior to Encounter  Medication Sig Dispense Refill   acetaminophen (TYLENOL) 500 MG tablet Take 1,000 mg by mouth daily as needed for moderate pain.      clotrimazole-betamethasone (LOTRISONE) cream Apply to affected area 2 times daily for up to two weeks. 45 g 0   ibuprofen (ADVIL) 600 MG tablet Take 1 tablet (600 mg total) by mouth every 6 (six) hours as needed. 30 tablet 0   methocarbamol (ROBAXIN) 500 MG tablet Take 1 tablet (500 mg total) by mouth 2 (two) times daily. 20 tablet 0   Social History   Socioeconomic History   Marital status: Single    Spouse name: Not on file   Number of children: Not on file   Years of education: Not on file   Highest education level: Not on file  Occupational History   Not on file  Tobacco Use   Smoking status: Current Some Day Smoker    Packs/day: 0.00    Types: Cigars   Smokeless tobacco: Never Used  Vaping Use   Vaping Use: Never used  Substance and Sexual Activity   Alcohol use: Yes   Drug use: Yes    Types:  Marijuana   Sexual activity: Not on file  Other Topics Concern   Not on file  Social History Narrative   Not on file   Social Determinants of Health   Financial Resource Strain:    Difficulty of Paying Living Expenses:   Food Insecurity:    Worried About Programme researcher, broadcasting/film/video in the Last Year:    Barista in the Last Year:   Transportation Needs:    Freight forwarder (Medical):    Lack of Transportation (Non-Medical):   Physical Activity:    Days of Exercise per Week:    Minutes of Exercise per Session:   Stress:    Feeling of Stress :   Social Connections:    Frequency of Communication with Friends and Family:    Frequency of Social Gatherings with Friends and Family:    Attends Religious Services:    Active Member of Clubs or Organizations:    Attends Engineer, structural:    Marital Status:   Intimate Partner Violence:    Fear of Current or Ex-Partner:    Emotionally Abused:    Physically Abused:    Sexually Abused:    Family History  Problem Relation Age of Onset   Heart failure Mother    Diabetes Mother    Cancer Mother    Healthy Father     OBJECTIVE:  Vitals:   05/15/20  0925  BP: 128/76  Pulse: 85  Resp: 18  Temp: 98.6 F (37 C)  TempSrc: Oral  SpO2: 100%    General appearance: alert; no distress HENT: normocephalic; atraumatic; dentition: poor; poor dentition over right lower gums without areas of fluctuance Neck: supple without LAD Lungs: normal respirations Skin: warm and dry GU: deferred Psychological: alert and cooperative; normal mood and affect  ASSESSMENT & PLAN:  1. Dental infection   2. Pain, dental   3. Rash and nonspecific skin eruption   4. Folliculitis barbae     Meds ordered this encounter  Medications   amoxicillin-clavulanate (AUGMENTIN) 875-125 MG tablet    Sig: Take 1 tablet by mouth 2 (two) times daily for 10 days.    Dispense:  20 tablet    Refill:  0    Order Specific  Question:   Supervising Provider    Answer:   Merrilee Jansky [id number]   triamcinolone cream (KENALOG) 0.1 %    Sig: Apply 1 application topically 2 (two) times daily.    Dispense:  30 g    Refill:  0    Order Specific Question:   Supervising Provider    Answer:   Merrilee Jansky [id number]    Triamcinolone to rash BID prn itching May use antibiotic ointment to open areas  Augmentin prescribed Ibuprofen prescribed.  Use as directed for pain relief Recommend soft diet until evaluated by dentist Maintain oral hygiene care Follow up with dentist as soon as possible for further evaluation and treatment  Return or go to the ED if you have any new or worsening symptoms such as fever, chills, difficulty swallowing, painful swallowing, oral or neck swelling, nausea, vomiting, chest pain, SOB.  Reviewed expectations re: course of current medical issues. Questions answered. Outlined signs and symptoms indicating need for more acute intervention. Patient verbalized understanding. After Visit Summary given.   Moshe Cipro, NP 05/15/20 1043

## 2020-05-15 NOTE — Discharge Instructions (Addendum)
Take the antibiotic as prescribed.    Use the steroid cream no more than three times per day.  Follow up with your primary care provider, or to see [place] as needed.  Report to the emergency room for shortness of breath, high fever, severe diarrhea, or other concerning symptoms.   A dental resource guide is attached.  Please call to make an appointment with a dentist as soon as possible.    Go to the emergency department if you have acute worsening symptoms.

## 2020-07-18 ENCOUNTER — Other Ambulatory Visit: Payer: Self-pay

## 2020-07-18 ENCOUNTER — Ambulatory Visit (HOSPITAL_COMMUNITY)
Admission: EM | Admit: 2020-07-18 | Discharge: 2020-07-18 | Disposition: A | Discharge status: Home / Self Care | Payer: Self-pay | Attending: Family Medicine | Admitting: Family Medicine

## 2020-07-18 ENCOUNTER — Encounter (HOSPITAL_COMMUNITY): Payer: Self-pay

## 2020-07-18 DIAGNOSIS — K047 Periapical abscess without sinus: Secondary | ICD-10-CM

## 2020-07-18 MED ORDER — AMOXICILLIN 500 MG PO CAPS
1000.0000 mg | ORAL_CAPSULE | Freq: Two times a day (BID) | ORAL | 0 refills | Status: DC
Start: 2020-07-18 — End: 2020-09-12

## 2020-07-18 MED ORDER — IBUPROFEN 600 MG PO TABS
600.0000 mg | ORAL_TABLET | Freq: Three times a day (TID) | ORAL | 0 refills | Status: DC | PRN
Start: 2020-07-18 — End: 2020-09-12

## 2020-07-18 NOTE — Discharge Instructions (Addendum)
Treating you for dental infection  Medicines as prescribed Follow up as needed for continued or worsening symptoms GI specialist for other issues that we discussed.

## 2020-07-18 NOTE — ED Triage Notes (Signed)
Pt present dental pain on the right side, symptoms started five days.

## 2020-07-21 NOTE — ED Provider Notes (Signed)
[facility]    CSN: [id number] Arrival date & time: 07/18/20  1015      History   Chief Complaint Chief Complaint  Patient presents with  . Dental Pain    HPI Howard Newman is a 39 y.o. male.   Patient is a 39 year old male presents today with dental pain.  Pain to the right lower mouth.  Multiple missing teeth and cavities in mouth.  Needs to get tooth pulled but does not currently have a dentist.  This problem started approximate 5 days ago.  No fever, chills or trismus.     History reviewed. No pertinent past medical history.  There are no problems to display for this patient.   History reviewed. No pertinent surgical history.     Home Medications    Prior to Admission medications   Medication Sig Start Date End Date Taking? Authorizing Provider  acetaminophen (TYLENOL) 500 MG tablet Take 1,000 mg by mouth daily as needed for moderate pain.     [provider]  amoxicillin (AMOXIL) 500 MG capsule Take 2 capsules (1,000 mg total) by mouth 2 (two) times daily. 07/18/20   Dahlia Byes A, NP  clotrimazole-betamethasone (LOTRISONE) cream Apply to affected area 2 times daily for up to two weeks. 03/11/20   Mardella Layman, MD  ibuprofen (ADVIL) 600 MG tablet Take 1 tablet (600 mg total) by mouth every 8 (eight) hours as needed for moderate pain. 07/18/20   Dahlia Byes A, NP  methocarbamol (ROBAXIN) 500 MG tablet Take 1 tablet (500 mg total) by mouth 2 (two) times daily. 04/07/20   Lamptey, Britta Mccreedy, MD  triamcinolone cream (KENALOG) 0.1 % Apply 1 application topically 2 (two) times daily. 05/15/20   Moshe Cipro, NP    Family History Family History  Problem Relation Age of Onset  . Heart failure Mother   . Diabetes Mother   . Cancer Mother   . Healthy Father     Social History Social History   Tobacco Use  . Smoking status: Current Some Day Smoker    Packs/day: 0.00    Types: Cigars  . Smokeless tobacco: Never Used  Vaping Use  .  Vaping Use: Never used  Substance Use Topics  . Alcohol use: Yes  . Drug use: Yes    Types: Marijuana     Allergies   Patient has no known allergies.   Review of Systems Review of Systems   Physical Exam Triage Vital Signs ED Triage Vitals [07/18/20 1056]  Enc Vitals Group     BP (!) 154/103     Pulse Rate 89     Resp 18     Temp 98.1 F (36.7 C)     Temp Source Oral     SpO2 98 %     Weight      Height      Head Circumference      Peak Flow      Pain Score 10     Pain Loc      Pain Edu?      Excl. in GC?    No data found.  Updated Vital Signs BP (!) 154/103 (BP Location: Right Arm)   Pulse 89   Temp 98.1 F (36.7 C) (Oral)   Resp 18   SpO2 98%   Visual Acuity Right Eye Distance:   Left Eye Distance:   Bilateral Distance:    Right Eye Near:   Left Eye Near:    Bilateral Near:  Physical Exam Vitals and nursing note reviewed.  Constitutional:      Appearance: Normal appearance.  HENT:     Head: Normocephalic and atraumatic.     Nose: Nose normal.     Mouth/Throat:     Comments: Multiple missing teeth in the mouth with caries.  1 single tooth to right lower mouth with gingival swelling.  No facial swelling or trismus Eyes:     Conjunctiva/sclera: Conjunctivae normal.  Pulmonary:     Effort: Pulmonary effort is normal.  Musculoskeletal:        General: Normal range of motion.     Cervical back: Normal range of motion.  Skin:    General: Skin is warm and dry.  Neurological:     Mental Status: He is alert.  Psychiatric:        Mood and Affect: Mood normal.      UC Treatments / Results  Labs (all labs ordered are listed, but only abnormal results are displayed) Labs Reviewed - No data to display  EKG   Radiology No results found.  Procedures Procedures (including critical care time)  Medications Ordered in UC Medications - No data to display  Initial Impression / Assessment and Plan / UC Course  I have reviewed the  triage vital signs and the nursing notes.  Pertinent labs & imaging results that were available during my care of the patient were reviewed by me and considered in my medical decision making (see chart for details).     Dental infection Covering with antibiotics.  Amoxicillin as prescribed. 600 mg ibuprofen for pain as needed. Follow up as needed for continued or worsening symptoms  Final Clinical Impressions(s) / UC Diagnoses   Final diagnoses:  Dental infection     Discharge Instructions     Treating you for dental infection  Medicines as prescribed Follow up as needed for continued or worsening symptoms GI specialist for other issues that we discussed.      ED Prescriptions    Medication Sig Dispense Auth. Provider   amoxicillin (AMOXIL) 500 MG capsule Take 2 capsules (1,000 mg total) by mouth 2 (two) times daily. 40 capsule Shawnell Dykes A, NP   ibuprofen (ADVIL) 600 MG tablet Take 1 tablet (600 mg total) by mouth every 8 (eight) hours as needed for moderate pain. 30 tablet Dahlia Byes A, NP     PDMP not reviewed this encounter.   Janace Aris, NP 07/21/20 1607

## 2020-09-12 ENCOUNTER — Ambulatory Visit (HOSPITAL_COMMUNITY)
Admission: EM | Admit: 2020-09-12 | Discharge: 2020-09-12 | Disposition: A | Discharge status: Home / Self Care | Payer: Self-pay | Attending: Family Medicine | Admitting: Family Medicine

## 2020-09-12 ENCOUNTER — Other Ambulatory Visit: Payer: Self-pay

## 2020-09-12 ENCOUNTER — Encounter (HOSPITAL_COMMUNITY): Payer: Self-pay | Admitting: Emergency Medicine

## 2020-09-12 DIAGNOSIS — B353 Tinea pedis: Secondary | ICD-10-CM

## 2020-09-12 DIAGNOSIS — M545 Low back pain, unspecified: Secondary | ICD-10-CM

## 2020-09-12 MED ORDER — IBUPROFEN 800 MG PO TABS
800.0000 mg | ORAL_TABLET | Freq: Three times a day (TID) | ORAL | 0 refills | Status: DC
Start: 2020-09-12 — End: 2020-11-19

## 2020-09-12 MED ORDER — CYCLOBENZAPRINE HCL 5 MG PO TABS
5.0000 mg | ORAL_TABLET | Freq: Three times a day (TID) | ORAL | 0 refills | Status: DC | PRN
Start: 2020-09-12 — End: 2020-11-11

## 2020-09-12 MED ORDER — CLOTRIMAZOLE-BETAMETHASONE 1-0.05 % EX CREA
TOPICAL_CREAM | CUTANEOUS | 1 refills | Status: DC
Start: 2020-09-12 — End: 2020-12-26

## 2020-09-12 NOTE — ED Triage Notes (Signed)
Pt c/o lower back pain x 1 week. Pt states he does a lot of heavy lifting at work. He states the pain radiates into his buttocks. Pt states he also has lots of itching of the top of his left foot. He states he has had athletes foot before.

## 2020-09-12 NOTE — Discharge Instructions (Addendum)
Take the medication as prescribed. Ibuprofen 800 mg for pain, inflammation.  Flexeril as needed for muscle relaxation.  Be aware this medicine will make you drowsy.  You may want to start this at bedtime to see how it makes you feel. Rest, gentle stretching.  Strengthening the back muscles can help to support her lower back area especially with heavy lifting Prescribed the Lotrisone cream for athlete's foot.  Use as prescribed Follow up as needed for continued or worsening symptoms

## 2020-09-15 NOTE — ED Provider Notes (Signed)
[facility]    CSN: [id number] Arrival date & time: 09/12/20  4098      History   Chief Complaint Chief Complaint  Patient presents with  . Back Pain  . Tinea Pedis    HPI Howard Newman is a 39 y.o. male.   Patient is a 39 year old male presents today with lower back pain for 1 week.  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  Pain radiates to bilateral buttocks and has trouble getting out of bed in the morning.  No associated numbness, tingling, weakness in extremities.  No loss of bowel or bladder function.  No injuries. 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  Very itchy.  Has been using over-the-counter treatment without any relief     History reviewed. No pertinent past medical history.  There are no problems to display for this patient.   History reviewed. No pertinent surgical history.     Home Medications    Prior to Admission medications   Medication Sig Start Date End Date Taking? Authorizing Provider  acetaminophen (TYLENOL) 500 MG tablet Take 1,000 mg by mouth daily as needed for moderate pain.     [provider]  clotrimazole-betamethasone (LOTRISONE) cream Apply to affected area 2 times daily for up to two weeks. 09/12/20   Dahlia Byes A, NP  cyclobenzaprine (FLEXERIL) 5 MG tablet Take 1 tablet (5 mg total) by mouth 3 (three) times daily as needed for muscle spasms. 09/12/20   Dahlia Byes A, NP  ibuprofen (ADVIL) 800 MG tablet Take 1 tablet (800 mg total) by mouth 3 (three) times daily. 09/12/20   Janace Aris, NP    Family History Family History  Problem Relation Age of Onset  . Heart failure Mother   . Diabetes Mother   . Cancer Mother   . Healthy Father     Social History Social History   Tobacco Use  . Smoking status: Current Some Day Smoker    Packs/day: 0.00    Types: Cigars  . Smokeless tobacco: Never Used  Vaping Use  . Vaping Use: Never used  Substance Use Topics  . Alcohol  use: Yes  . Drug use: Yes    Types: Marijuana     Allergies   Patient has no known allergies.   Review of Systems Review of Systems   Physical Exam Triage Vital Signs ED Triage Vitals  Enc Vitals Group     BP 09/12/20 1102 (!) 153/105     Pulse Rate 09/12/20 1102 72     Resp --      Temp 09/12/20 1102 98.5 F (36.9 C)     Temp Source 09/12/20 1102 Oral     SpO2 09/12/20 1102 97 %     Weight --      Height --      Head Circumference --      Peak Flow --      Pain Score 09/12/20 1100 9     Pain Loc --      Pain Edu? --      Excl. in GC? --    No data found.  Updated Vital Signs BP (!) 153/105 (BP Location: Left Arm)   Pulse 72   Temp 98.5 F (36.9 C) (Oral)   SpO2 97%   Visual Acuity Right Eye Distance:   Left Eye Distance:   Bilateral Distance:    Right Eye Near:   Left Eye Near:  Bilateral Near:     Physical Exam Vitals and nursing note reviewed.  Constitutional:      Appearance: Normal appearance.  HENT:     Head: Normocephalic and atraumatic.     Nose: Nose normal.  Eyes:     Conjunctiva/sclera: Conjunctivae normal.  Pulmonary:     Effort: Pulmonary effort is normal.  Musculoskeletal:        General: Normal range of motion.     Cervical back: Normal range of motion.     Lumbar back: Tenderness present. No bony tenderness. Normal range of motion. Negative right straight leg raise test and negative left straight leg raise test.       Back:       Feet:     Comments: TTP  Feet:     Comments: Rash  Skin:    General: Skin is warm and dry.  Neurological:     Mental Status: He is alert.  Psychiatric:        Mood and Affect: Mood normal.      UC Treatments / Results  Labs (all labs ordered are listed, but only abnormal results are displayed) Labs Reviewed - No data to display  EKG   Radiology No results found.  Procedures Procedures (including critical care time)  Medications Ordered in UC Medications - No data to  display  Initial Impression / Assessment and Plan / UC Course  I have reviewed the triage vital signs and the nursing notes.  Pertinent labs & imaging results that were available during my care of the patient were reviewed by me and considered in my medical decision making (see chart for details).     Athlete's foot Treating with Lotrisone cream  Low back pain without sciatica 800 mg ibuprofen for pain Flexeril for muscle relaxant as needed.  Recommended rest, gentle stretching and strengthening of the back muscles to help support lower back area. Follow up as needed for continued or worsening symptoms  Final Clinical Impressions(s) / UC Diagnoses   Final diagnoses:  Athlete's foot on left  Acute bilateral low back pain without sciatica     Discharge Instructions     Take the medication as prescribed. Ibuprofen 800 mg for pain, inflammation.  Flexeril as needed for muscle relaxation.  Be aware this medicine will make you drowsy.  You may want to start this at bedtime to see how it makes you feel. Rest, gentle stretching.  Strengthening the back muscles can help to support her lower back area especially with heavy lifting Prescribed the Lotrisone cream for athlete's foot.  Use as prescribed Follow up as needed for continued or worsening symptoms     ED Prescriptions    Medication Sig Dispense Auth. Provider   clotrimazole-betamethasone (LOTRISONE) cream Apply to affected area 2 times daily for up to two weeks. 45 g Natia Fahmy A, NP   ibuprofen (ADVIL) 800 MG tablet Take 1 tablet (800 mg total) by mouth 3 (three) times daily. 21 tablet Renny Gunnarson A, NP   cyclobenzaprine (FLEXERIL) 5 MG tablet Take 1 tablet (5 mg total) by mouth 3 (three) times daily as needed for muscle spasms. 30 tablet Dahlia Byes A, NP     PDMP not reviewed this encounter.   Janace Aris, NP 09/15/20 [phone]

## 2020-11-08 ENCOUNTER — Ambulatory Visit (HOSPITAL_COMMUNITY)
Admission: EM | Admit: 2020-11-08 | Discharge: 2020-11-08 | Disposition: A | Discharge status: Home / Self Care | Payer: Self-pay | Attending: Emergency Medicine | Admitting: Emergency Medicine

## 2020-11-08 ENCOUNTER — Other Ambulatory Visit: Payer: Self-pay

## 2020-11-08 ENCOUNTER — Encounter (HOSPITAL_COMMUNITY): Payer: Self-pay

## 2020-11-08 DIAGNOSIS — K029 Dental caries, unspecified: Secondary | ICD-10-CM

## 2020-11-08 DIAGNOSIS — K047 Periapical abscess without sinus: Secondary | ICD-10-CM

## 2020-11-08 DIAGNOSIS — R030 Elevated blood-pressure reading, without diagnosis of hypertension: Secondary | ICD-10-CM

## 2020-11-08 MED ORDER — AMOXICILLIN 875 MG PO TABS
875.0000 mg | ORAL_TABLET | Freq: Two times a day (BID) | ORAL | 0 refills | Status: DC
Start: 2020-11-08 — End: 2020-11-11

## 2020-11-08 NOTE — ED Provider Notes (Signed)
[facility]    CSN: [id number] Arrival date & time: 11/08/20  1045      History   Chief Complaint Chief Complaint  Patient presents with  . Dental Pain    X 2-3 weeks    HPI Howard Newman is a 40 y.o. male.   Patient presents with dental pain and gum pain in his right lower mouth x2 to 3 weeks.  He states he has had the majority of his teeth pulled and has been seeing a dentist.  He denies fever, chills, difficulty swallowing, or other symptoms.  Patient was seen here on 07/18/2020 for similar symptoms; he has been seen here multiple times over the past few years for dental concerns.  The history is provided by the patient and medical records.    History reviewed. No pertinent past medical history.  There are no problems to display for this patient.   History reviewed. No pertinent surgical history.     Home Medications    Prior to Admission medications   Medication Sig Start Date End Date Taking? Authorizing Provider  acetaminophen (TYLENOL) 500 MG tablet Take 1,000 mg by mouth daily as needed for moderate pain.    Yes [provider]  amoxicillin (AMOXIL) 875 MG tablet Take 1 tablet (875 mg total) by mouth 2 (two) times daily for 7 days. 11/08/20 11/15/20 Yes Mickie Bail, NP  cyclobenzaprine (FLEXERIL) 5 MG tablet Take 1 tablet (5 mg total) by mouth 3 (three) times daily as needed for muscle spasms. 09/12/20  Yes Bast, Traci A, NP  ibuprofen (ADVIL) 800 MG tablet Take 1 tablet (800 mg total) by mouth 3 (three) times daily. 09/12/20  Yes Bast, Traci A, NP  clotrimazole-betamethasone (LOTRISONE) cream Apply to affected area 2 times daily for up to two weeks. 09/12/20   Janace Aris, NP    Family History Family History  Problem Relation Age of Onset  . Heart failure Mother   . Diabetes Mother   . Cancer Mother   . Healthy Father     Social History Social History   Tobacco Use  . Smoking status: Current Some Day Smoker    Packs/day: 0.00     Types: Cigars  . Smokeless tobacco: Never Used  Vaping Use  . Vaping Use: Never used  Substance Use Topics  . Alcohol use: Yes  . Drug use: Yes    Types: Marijuana     Allergies   Patient has no known allergies.   Review of Systems Review of Systems  Constitutional: Negative for chills and fever.  HENT: Positive for dental problem. Negative for ear pain, sore throat and trouble swallowing.   Eyes: Negative for pain and visual disturbance.  Respiratory: Negative for cough and shortness of breath.   Cardiovascular: Negative for chest pain and palpitations.  Gastrointestinal: Negative for abdominal pain and vomiting.  Genitourinary: Negative for dysuria and hematuria.  Musculoskeletal: Negative for arthralgias and back pain.  Skin: Negative for color change and rash.  Neurological: Negative for seizures and syncope.  All other systems reviewed and are negative.    Physical Exam Triage Vital Signs ED Triage Vitals  Enc Vitals Group     BP      Pulse      Resp      Temp      Temp src      SpO2      Weight      Height      Head  Circumference      Peak Flow      Pain Score      Pain Loc      Pain Edu?      Excl. in GC?    No data found.  Updated Vital Signs BP (!) 150/99 (BP Location: Left Arm)   Pulse 85   Temp 98 F (36.7 C) (Oral)   Resp 18   SpO2 100%   Visual Acuity Right Eye Distance:   Left Eye Distance:   Bilateral Distance:    Right Eye Near:   Left Eye Near:    Bilateral Near:     Physical Exam Vitals and nursing note reviewed.  Constitutional:      Appearance: He is well-developed and well-nourished.  HENT:     Head: Normocephalic and atraumatic.     Mouth/Throat:     Mouth: Mucous membranes are moist.     Dentition: Abnormal dentition. Gingival swelling and dental caries present.     Pharynx: Uvula midline.     Comments: Many teeth missing. Right lower gum tender and mildly erythematous.  Eyes:     Conjunctiva/sclera: Conjunctivae  normal.  Cardiovascular:     Rate and Rhythm: Normal rate and regular rhythm.     Heart sounds: Normal heart sounds.  Pulmonary:     Effort: Pulmonary effort is normal. No respiratory distress.     Breath sounds: Normal breath sounds.  Abdominal:     Palpations: Abdomen is soft.     Tenderness: There is no abdominal tenderness.  Musculoskeletal:        General: No edema.     Cervical back: Neck supple.  Skin:    General: Skin is warm and dry.  Neurological:     General: No focal deficit present.     Mental Status: He is alert and oriented to person, place, and time.     Gait: Gait normal.  Psychiatric:        Mood and Affect: Mood and affect and mood normal.        Behavior: Behavior normal.      UC Treatments / Results  Labs (all labs ordered are listed, but only abnormal results are displayed) Labs Reviewed - No data to display  EKG   Radiology No results found.  Procedures Procedures (including critical care time)  Medications Ordered in UC Medications - No data to display  Initial Impression / Assessment and Plan / UC Course  I have reviewed the triage vital signs and the nursing notes.  Pertinent labs & imaging results that were available during my care of the patient were reviewed by me and considered in my medical decision making (see chart for details).   Pain due to dental caries and dental infection. Elevated blood pressure.  Treating with amoxicillin.  Instructed patient to follow-up with his dentist; dental resource guide provided.  Discussed with patient that his blood pressure is elevated today needs to be rechecked by his PCP in 2 to 4 weeks; patient states he does not have a PCP.  Assistance requested through [facility] to find a PCP.  Patient agrees to plan of care.   Final Clinical Impressions(s) / UC Diagnoses   Final diagnoses:  Pain due to dental caries  Dental infection  Elevated blood pressure reading     Discharge Instructions     Take  the antibiotic as prescribed.    A dental resource guide is attached.  Please call to make an appointment with  a dentist as soon as possible.    Your blood pressure is elevated today at 150/99.  Please have this rechecked by your primary care provider in 2-4 weeks.         ED Prescriptions    Medication Sig Dispense Auth. Provider   amoxicillin (AMOXIL) 875 MG tablet Take 1 tablet (875 mg total) by mouth 2 (two) times daily for 7 days. 14 tablet Mickie Bail, NP     I have reviewed the PDMP during this encounter.   Mickie Bail, NP 11/08/20 1125

## 2020-11-08 NOTE — ED Triage Notes (Signed)
Patient states he has had 2-3 weeks and states he has a possible infection on his bottom jaw/gum. Pt states he had a lower gum abscess recently as well. Pt is aox4 and ambulatory.

## 2020-11-08 NOTE — Discharge Instructions (Addendum)
Take the antibiotic as prescribed.    A dental resource guide is attached.  Please call to make an appointment with a dentist as soon as possible.    Your blood pressure is elevated today at 150/99.  Please have this rechecked by your primary care provider in 2-4 weeks.

## 2020-11-11 ENCOUNTER — Other Ambulatory Visit: Payer: Self-pay

## 2020-11-11 ENCOUNTER — Ambulatory Visit
Admission: EM | Admit: 2020-11-11 | Discharge: 2020-11-11 | Disposition: A | Discharge status: Home / Self Care | Payer: Self-pay | Attending: Emergency Medicine | Admitting: Emergency Medicine

## 2020-11-11 DIAGNOSIS — Z113 Encounter for screening for infections with a predominantly sexual mode of transmission: Secondary | ICD-10-CM

## 2020-11-11 NOTE — ED Provider Notes (Signed)
[name]    CSN: [id number] Arrival date & time: 11/11/20  1729      History   Chief Complaint Chief Complaint  Patient presents with  . SEXUALLY TRANSMITTED DISEASE    HPI Howard Newman is Newman 40 y.o. male presenting today for STD screening.  Reports recent intercourse on Thursday with new partner.  Requesting STD screening.  Denies any dysuria, discharge.  Has noticed Newman small amount of clear substance at the tip of his penis occasionally, but is mainly notices when aroused.  HPI  History reviewed. No pertinent past medical history.  There are no problems to display for this patient.   History reviewed. No pertinent surgical history.     Home Medications    Prior to Admission medications   Medication Sig Start Date End Date Taking? Authorizing Provider  acetaminophen (TYLENOL) 500 MG tablet Take 1,000 mg by mouth daily as needed for moderate pain.     [provider]  clotrimazole-betamethasone (LOTRISONE) cream Apply to affected area 2 times daily for up to two weeks. 09/12/20   Howard Byes A, NP  ibuprofen (ADVIL) 800 MG tablet Take 1 tablet (800 mg total) by mouth 3 (three) times daily. 09/12/20   Howard Aris, NP    Family History Family History  Problem Relation Age of Onset  . Heart failure Mother   . Diabetes Mother   . Cancer Mother   . Healthy Father     Social History Social History   Tobacco Use  . Smoking status: Current Some Day Smoker    Packs/day: 0.00    Types: Cigars  . Smokeless tobacco: Never Used  Vaping Use  . Vaping Use: Never used  Substance Use Topics  . Alcohol use: Yes  . Drug use: Yes    Types: Marijuana     Allergies   Patient has no known allergies.   Review of Systems Review of Systems  Constitutional: Negative for fever.  HENT: Negative for sore throat.   Respiratory: Negative for shortness of breath.   Cardiovascular: Negative for chest pain.  Gastrointestinal: Negative for abdominal  pain, nausea and vomiting.  Genitourinary: Negative for difficulty urinating, dysuria, frequency, penile discharge, penile pain, penile swelling, scrotal swelling and testicular pain.  Skin: Negative for rash.  Neurological: Negative for dizziness, light-headedness and headaches.     Physical Exam Triage Vital Signs ED Triage Vitals  Enc Vitals Group     BP      Pulse      Resp      Temp      Temp src      SpO2      Weight      Height      Head Circumference      Peak Flow      Pain Score      Pain Loc      Pain Edu?      Excl. in GC?    No data found.  Updated Vital Signs BP 108/85 (BP Location: Left Arm)   Pulse 92   Temp 98.1 F (36.7 C) (Oral)   Resp 18   SpO2 96%   Visual Acuity Right Eye Distance:   Left Eye Distance:   Bilateral Distance:    Right Eye Near:   Left Eye Near:    Bilateral Near:     Physical Exam Vitals and nursing note reviewed.  Constitutional:      Appearance: He is well-developed and  well-nourished.     Comments: No acute distress  HENT:     Head: Normocephalic and atraumatic.     Nose: Nose normal.  Eyes:     Conjunctiva/sclera: Conjunctivae normal.  Cardiovascular:     Rate and Rhythm: Normal rate.  Pulmonary:     Effort: Pulmonary effort is normal. No respiratory distress.  Abdominal:     General: There is no distension.  Musculoskeletal:        General: Normal range of motion.     Cervical back: Neck supple.  Skin:    General: Skin is warm and dry.  Neurological:     Mental Status: He is alert and oriented to person, place, and time.  Psychiatric:        Mood and Affect: Mood and affect normal.      UC Treatments / Results  Labs (all labs ordered are listed, but only abnormal results are displayed) Labs Reviewed  CYTOLOGY, (ORAL, ANAL, URETHRAL) ANCILLARY ONLY    EKG   Radiology No results found.  Procedures Procedures (including critical care time)  Medications Ordered in UC Medications - No data  to display  Initial Impression / Assessment and Plan / UC Course  I have reviewed the triage vital signs and the nursing notes.  Pertinent labs & imaging results that were available during my care of the patient were reviewed by me and considered in my medical decision making (see chart for details).     STD screening-urethral swab pending for GC/chlamydia/trichomonas.  Deferring HIV and syphilis.  Depending on timing, recommended retesting if developing any symptoms despite negative testing today.  Discussed strict return precautions. Patient verbalized understanding and is agreeable with plan.  Final Clinical Impressions(s) / UC Diagnoses   Final diagnoses:  Screen for STD (sexually transmitted disease)     Discharge Instructions     We are testing you for Gonorrhea, Chlamydia and Trichomonas. We will call you if anything is positive and let you know if you require any further treatment. Please inform partner of any positive results.  Please return if symptoms not improving with treatment, development of fever, nausea, vomiting, abdominal pain, scrotal pain.    ED Prescriptions    None     PDMP not reviewed this encounter.   Howard Newman, [place] 11/11/20 1910

## 2020-11-11 NOTE — ED Triage Notes (Signed)
Pt requesting STD testing. States had un protective intercourse.

## 2020-11-11 NOTE — Discharge Instructions (Signed)
We are testing you for Gonorrhea, Chlamydia and Trichomonas. We will call you if anything is positive and let you know if you require any further treatment. Please inform partner of any positive results.  Please return if symptoms not improving with treatment, development of fever, nausea, vomiting, abdominal pain, scrotal pain. 

## 2020-11-13 LAB — CYTOLOGY, (ORAL, ANAL, URETHRAL) ANCILLARY ONLY
Chlamydia: NEGATIVE
Comment: NEGATIVE
Comment: NEGATIVE
Comment: NORMAL
Neisseria Gonorrhea: NEGATIVE
Trichomonas: NEGATIVE

## 2020-11-19 ENCOUNTER — Other Ambulatory Visit: Payer: Self-pay

## 2020-11-19 ENCOUNTER — Encounter (HOSPITAL_COMMUNITY): Payer: Self-pay

## 2020-11-19 ENCOUNTER — Ambulatory Visit (HOSPITAL_COMMUNITY)
Admission: EM | Admit: 2020-11-19 | Discharge: 2020-11-19 | Disposition: A | Discharge status: Home / Self Care | Payer: Self-pay | Attending: Emergency Medicine | Admitting: Emergency Medicine

## 2020-11-19 DIAGNOSIS — S0993XA Unspecified injury of face, initial encounter: Secondary | ICD-10-CM

## 2020-11-19 MED ORDER — IBUPROFEN 800 MG PO TABS
800.0000 mg | ORAL_TABLET | Freq: Three times a day (TID) | ORAL | 0 refills | Status: DC | PRN
Start: 2020-11-19 — End: 2020-12-26

## 2020-11-19 MED ORDER — CHLORHEXIDINE GLUCONATE 0.12 % MT SOLN
OROMUCOSAL | 0 refills | Status: DC
Start: 2020-11-19 — End: 2020-12-11

## 2020-11-19 NOTE — ED Provider Notes (Signed)
[facility]    CSN: [id number] Arrival date & time: 11/19/20  1656      History   Chief Complaint Chief Complaint  Patient presents with  . Oral Swelling    HPI Howard Newman is a 40 y.o. male.   Howard Newman presents with complaints of laceration to his lower lip, as well as swelling. He was playing basketball and another player elbowed him in the mouth. No active bleeding. No LOC. No neck pain. Lower lip has some swelling.     ROS per HPI, negative if not otherwise mentioned.      History reviewed. No pertinent past medical history.  There are no problems to display for this patient.   History reviewed. No pertinent surgical history.     Home Medications    Prior to Admission medications   Medication Sig Start Date End Date Taking? Authorizing Provider  chlorhexidine (PERIDEX) 0.12 % solution Swish and spit twice a day until healed 11/19/20  Yes Burky, Natalie B, NP  acetaminophen (TYLENOL) 500 MG tablet Take 1,000 mg by mouth daily as needed for moderate pain.     [provider]  clotrimazole-betamethasone (LOTRISONE) cream Apply to affected area 2 times daily for up to two weeks. 09/12/20   Dahlia Byes A, NP  ibuprofen (ADVIL) 800 MG tablet Take 1 tablet (800 mg total) by mouth every 8 (eight) hours as needed for mild pain. 11/19/20   Georgetta Haber, NP    Family History Family History  Problem Relation Age of Onset  . Heart failure Mother   . Diabetes Mother   . Cancer Mother   . Healthy Father     Social History Social History   Tobacco Use  . Smoking status: Current Some Day Smoker    Packs/day: 0.00    Types: Cigars  . Smokeless tobacco: Never Used  Vaping Use  . Vaping Use: Never used  Substance Use Topics  . Alcohol use: Yes  . Drug use: Yes    Types: Marijuana     Allergies   Patient has no known allergies.   Review of Systems Review of Systems   Physical Exam Triage Vital Signs ED Triage Vitals   Enc Vitals Group     BP 11/19/20 1735 (!) 141/91     Pulse Rate 11/19/20 1734 92     Resp 11/19/20 1734 19     Temp 11/19/20 1734 98.5 F (36.9 C)     Temp src --      SpO2 11/19/20 1734 97 %     Weight --      Height --      Head Circumference --      Peak Flow --      Pain Score 11/19/20 1733 9     Pain Loc --      Pain Edu? --      Excl. in GC? --    No data found.  Updated Vital Signs BP (!) 141/91   Pulse 92   Temp 98.5 F (36.9 C)   Resp 19   SpO2 97%   Visual Acuity Right Eye Distance:   Left Eye Distance:   Bilateral Distance:    Right Eye Near:   Left Eye Near:    Bilateral Near:     Physical Exam Constitutional:      Appearance: He is well-developed.  HENT:     Mouth/Throat:      Comments: Left lower lip with  superficial abrasions and two puncture wound to inside of lip which are not through and through, no active bleeding; teeth are non tender and intact, not loose  Cardiovascular:     Rate and Rhythm: Normal rate.  Pulmonary:     Effort: Pulmonary effort is normal.  Skin:    General: Skin is warm and dry.  Neurological:     Mental Status: He is alert and oriented to person, place, and time.      UC Treatments / Results  Labs (all labs ordered are listed, but only abnormal results are displayed) Labs Reviewed - No data to display  EKG   Radiology No results found.  Procedures Procedures (including critical care time)  Medications Ordered in UC Medications - No data to display  Initial Impression / Assessment and Plan / UC Course  I have reviewed the triage vital signs and the nursing notes.  Pertinent labs & imaging results that were available during my care of the patient were reviewed by me and considered in my medical decision making (see chart for details).     Lower lip puncture wounds without through and through injury or large gaping wound. No closure indicated. Wound care discussed. Return precautions provided.  Patient verbalized understanding and agreeable to plan.   Final Clinical Impressions(s) / UC Diagnoses   Final diagnoses:  Lip injury, initial encounter     Discharge Instructions     Apply ice Use of ibuprofen for pain.  Swish and spit mouthwash twice a day until healed.  After eating swish water to cleanse out wound of any food debris.  Will heal over the next week.     ED Prescriptions    Medication Sig Dispense Auth. Provider   chlorhexidine (PERIDEX) 0.12 % solution Swish and spit twice a day until healed 120 mL Burky, Dorene Grebe B, NP   ibuprofen (ADVIL) 800 MG tablet Take 1 tablet (800 mg total) by mouth every 8 (eight) hours as needed for mild pain. 21 tablet Georgetta Haber, NP     PDMP not reviewed this encounter.   Georgetta Haber, NP 11/19/20 1759

## 2020-11-19 NOTE — ED Triage Notes (Signed)
Pt in with c/o lip swelling and laceration that occurred while he was playing basketball and was elbowed in the mouth.  No active bleeding

## 2020-11-19 NOTE — Discharge Instructions (Signed)
Apply ice Use of ibuprofen for pain.  Swish and spit mouthwash twice a day until healed.  After eating swish water to cleanse out wound of any food debris.  Will heal over the next week.

## 2020-12-11 ENCOUNTER — Other Ambulatory Visit: Payer: Self-pay

## 2020-12-11 ENCOUNTER — Ambulatory Visit (HOSPITAL_COMMUNITY)
Admission: EM | Admit: 2020-12-11 | Discharge: 2020-12-11 | Disposition: A | Discharge status: Home / Self Care | Payer: Self-pay

## 2020-12-11 ENCOUNTER — Encounter (HOSPITAL_COMMUNITY): Payer: Self-pay | Admitting: Emergency Medicine

## 2020-12-11 DIAGNOSIS — G8929 Other chronic pain: Secondary | ICD-10-CM

## 2020-12-11 DIAGNOSIS — R42 Dizziness and giddiness: Secondary | ICD-10-CM

## 2020-12-11 DIAGNOSIS — R519 Headache, unspecified: Secondary | ICD-10-CM

## 2020-12-11 DIAGNOSIS — I998 Other disorder of circulatory system: Secondary | ICD-10-CM

## 2020-12-11 DIAGNOSIS — H539 Unspecified visual disturbance: Secondary | ICD-10-CM

## 2020-12-11 HISTORY — DX: Disorder of teeth and supporting structures, unspecified: K08.9

## 2020-12-11 NOTE — Discharge Instructions (Addendum)
Recommend contact [facility] To schedule appointment for changes in vision. Also continue to monitor blood pressure- recommend decreasing Ibuprofen use since it can raise your blood pressure. May take tylenol 1000mg  every 12 hours as needed for headache but drinking alcohol while taking this medication can cause liver damage. Decrease salt intake. Call [facility] to schedule appointment with a Primary Care Provider for further evaluation.

## 2020-12-11 NOTE — ED Provider Notes (Signed)
[facility]    CSN: [id number] Arrival date & time: 12/11/20  1058      History   Chief Complaint Chief Complaint  Patient presents with  . Dizziness  . Hypertension  . Headache    HPI Howard Newman is a 40 y.o. male.   40 year old male presents with 3 different concerns. He has noticed that he has been having vision changes for the past few months. He is seeing spots or various "sweeping shapes" out of the corner of both eyes. Occurs randomly but notices mostly when he is sitting and watching TV. When he turns his head to look toward the spots, they disappear. He is also having almost daily headaches and occasional dizziness. He works for [employer] and is stressed daily. He usually wakes up with a headache and takes 3 Ibuprofen. He is also concerned about the variation in his blood pressure. He is uncertain if the dizziness, headaches and vision changes were related to BP. The past few visits, his blood pressure has been elevated but today it is normal. He denies any fever, nasal congestion, sore throat, chest pain, difficulty breathing or GI symptoms. He does smoke tobacco daily and has used Marijuana. Other chronic health issues include chronic dental concerns and infections. No current PCP. Usually goes to Urgent Care or ER for care and has had 10 visits in the past year. Mom with history of heart issues and diabetes. Not currently taking any daily medication except Ibuprofen mentioned above. He is worried about all these issues but received 2 phone calls while he was in the exam room with the provider and he is unable to stay for a more complete work up today due to family issues.   The history is provided by the patient.    Past Medical History:  Diagnosis Date  . Dental disease     There are no problems to display for this patient.   History reviewed. No pertinent surgical history.     Home Medications    Prior to Admission medications   Medication Sig Start  Date End Date Taking? Authorizing Provider  acetaminophen (TYLENOL) 500 MG tablet Take 1,000 mg by mouth daily as needed for moderate pain.     [provider]  clotrimazole-betamethasone (LOTRISONE) cream Apply to affected area 2 times daily for up to two weeks. 09/12/20   Dahlia Byes A, NP  ibuprofen (ADVIL) 800 MG tablet Take 1 tablet (800 mg total) by mouth every 8 (eight) hours as needed for mild pain. 11/19/20   Georgetta Haber, NP    Family History Family History  Problem Relation Age of Onset  . Heart failure Mother   . Diabetes Mother   . Cancer Mother   . Healthy Father     Social History Social History   Tobacco Use  . Smoking status: Current Some Day Smoker    Packs/day: 0.00    Types: Cigars  . Smokeless tobacco: Never Used  Vaping Use  . Vaping Use: Never used  Substance Use Topics  . Alcohol use: Yes  . Drug use: Yes    Types: Marijuana     Allergies   Patient has no known allergies.   Review of Systems Review of Systems  Constitutional: Positive for fatigue. Negative for activity change, appetite change, chills and fever.  HENT: Positive for dental problem (history- not this visit). Negative for congestion, facial swelling, hearing loss, postnasal drip, rhinorrhea, sinus pressure, sinus pain and sore  throat.   Eyes: Positive for visual disturbance. Negative for photophobia, pain, discharge, redness and itching.  Respiratory: Negative for cough, shortness of breath and wheezing.   Cardiovascular: Negative for chest pain and palpitations.  Gastrointestinal: Negative for nausea and vomiting.  Musculoskeletal: Positive for arthralgias. Negative for neck pain and neck stiffness.  Skin: Negative for color change and rash.  Allergic/Immunologic: Negative for environmental allergies, food allergies and immunocompromised state.  Neurological: Positive for dizziness, light-headedness and headaches. Negative for tremors, seizures, syncope, facial asymmetry,  speech difficulty, weakness and numbness.  Hematological: Negative for adenopathy. Does not bruise/bleed easily.     Physical Exam Triage Vital Signs ED Triage Vitals  Enc Vitals Group     BP 12/11/20 1141 109/78     Pulse Rate 12/11/20 1141 93     Resp 12/11/20 1141 18     Temp --      Temp src --      SpO2 12/11/20 1141 98 %     Weight --      Height --      Head Circumference --      Peak Flow --      Pain Score 12/11/20 1139 0     Pain Loc --      Pain Edu? --      Excl. in GC? --    No data found.  Updated Vital Signs BP 109/78 (BP Location: Right Arm)   Pulse 93   Resp 18   SpO2 98%   Visual Acuity Right Eye Distance:   Left Eye Distance:   Bilateral Distance:    Right Eye Near:   Left Eye Near:    Bilateral Near:     Physical Exam Vitals and nursing note reviewed.  Constitutional:      General: He is awake. He is not in acute distress.    Appearance: He is well-developed.     Comments: He is sitting on the exam table in no acute distress but appears tired and distracted.   HENT:     Head: Normocephalic and atraumatic.     Right Ear: Hearing, tympanic membrane, ear canal and external ear normal.     Left Ear: Hearing, tympanic membrane, ear canal and external ear normal.     Nose: Nose normal. No congestion or rhinorrhea.     Mouth/Throat:     Lips: Pink.     Mouth: Mucous membranes are moist.     Dentition: Abnormal dentition.  Eyes:     General: Lids are normal. Vision grossly intact. No visual field deficit.    Extraocular Movements: Extraocular movements intact.     Conjunctiva/sclera:     Right eye: Right conjunctiva is injected.     Left eye: Left conjunctiva is injected.     Pupils: Pupils are equal, round, and reactive to light.  Cardiovascular:     Rate and Rhythm: Normal rate and regular rhythm.     Heart sounds: Normal heart sounds.  Pulmonary:     Effort: Pulmonary effort is normal. No respiratory distress.     Breath sounds:  Normal breath sounds and air entry. No decreased air movement. No decreased breath sounds, wheezing or rhonchi.  Musculoskeletal:        General: Normal range of motion.     Cervical back: Normal range of motion.  Skin:    General: Skin is warm and dry.  Neurological:     General: No focal deficit present.  Mental Status: He is alert and oriented to person, place, and time.     Cranial Nerves: Cranial nerves are intact.     Motor: Motor function is intact.  Psychiatric:        Mood and Affect: Mood is anxious.        Speech: Speech normal.        Behavior: Behavior is cooperative.        Thought Content: Thought content normal.        Cognition and Memory: Cognition normal.     Comments: He appears a little nervous and anxious and distracted and keeps asking for provider to hurry with any paperwork and notes but answers questions appropriately.       UC Treatments / Results  Labs (all labs ordered are listed, but only abnormal results are displayed) Labs Reviewed - No data to display  EKG   Radiology No results found.  Procedures Procedures (including critical care time)  Medications Ordered in UC Medications - No data to display  Initial Impression / Assessment and Plan / UC Course  I have reviewed the triage vital signs and the nursing notes.  Pertinent labs & imaging results that were available during my care of the patient were reviewed by me and considered in my medical decision making (see chart for details).    Reviewed with patient that he needs additional work up including labs and testing. He needs evaluation by an eye doctor. Patient does not have time to stay for ECG today or other blood work. Patient is stable and little concern for urgent or emergent evaluation at this time. Discussed that blood pressure is normal today but need to monitor and can be affected by multiple factors including tobacco use and diet and salt intake. Also discussed that Ibuprofen  can raise blood pressure so would recommend switching to Tylenol 1000mg  every 12 hours as needed. However- taking Tylenol and drinking alcohol can damage his liver and reviewed that he should not drink alcohol and take Tylenol. Provided information on [facility] to call today to schedule appointment to become established with a PCP. Also recommend call [facility] today to schedule appointment for further evaluation of vision changes. Note written for work for today. Follow-up with a PCP and Eye Doctor as recommended.  Final Clinical Impressions(s) / UC Diagnoses   Final diagnoses:  Vision changes  Chronic intractable headache, unspecified headache type  Fluctuating blood pressure  Dizziness     Discharge Instructions     Recommend contact [facility] To schedule appointment for changes in vision. Also continue to monitor blood pressure- recommend decreasing Ibuprofen use since it can raise your blood pressure. May take tylenol 1000mg  every 12 hours as needed for headache but drinking alcohol while taking this medication can cause liver damage. Decrease salt intake. Call [facility] to schedule appointment with a Primary Care Provider for further evaluation.     ED Prescriptions    None     PDMP not reviewed this encounter.   , NP 12/12/20 1530

## 2020-12-11 NOTE — ED Triage Notes (Signed)
Pt presents with headaches, dizziness, and spots in vision. States is concerned with blood pressure.   Pt states has never seen eye doctor for vision concerns.

## 2020-12-12 ENCOUNTER — Encounter (HOSPITAL_COMMUNITY): Payer: Self-pay | Admitting: Family

## 2020-12-25 ENCOUNTER — Emergency Department (HOSPITAL_COMMUNITY)
Admission: EM | Admit: 2020-12-25 | Discharge: 2020-12-26 | Disposition: A | Discharge status: Home / Self Care | Payer: Self-pay | Attending: Emergency Medicine | Admitting: Emergency Medicine

## 2020-12-25 ENCOUNTER — Other Ambulatory Visit: Payer: Self-pay

## 2020-12-25 ENCOUNTER — Emergency Department (HOSPITAL_COMMUNITY): Payer: Self-pay

## 2020-12-25 DIAGNOSIS — F17290 Nicotine dependence, other tobacco product, uncomplicated: Secondary | ICD-10-CM | POA: Insufficient documentation

## 2020-12-25 DIAGNOSIS — R519 Headache, unspecified: Secondary | ICD-10-CM | POA: Insufficient documentation

## 2020-12-25 DIAGNOSIS — Z20822 Contact with and (suspected) exposure to covid-19: Secondary | ICD-10-CM | POA: Insufficient documentation

## 2020-12-25 DIAGNOSIS — S0292XA Unspecified fracture of facial bones, initial encounter for closed fracture: Secondary | ICD-10-CM | POA: Insufficient documentation

## 2020-12-25 DIAGNOSIS — S62617A Displaced fracture of proximal phalanx of left little finger, initial encounter for closed fracture: Secondary | ICD-10-CM | POA: Insufficient documentation

## 2020-12-25 NOTE — ED Triage Notes (Signed)
Left sided facial pain and swelling and left pinky finger pain due to fall. Not able to bend/flex pink finger. Denies any nausea and vomiting. [id number]

## 2020-12-26 ENCOUNTER — Emergency Department (HOSPITAL_COMMUNITY): Payer: Self-pay

## 2020-12-26 ENCOUNTER — Encounter (HOSPITAL_BASED_OUTPATIENT_CLINIC_OR_DEPARTMENT_OTHER): Payer: Self-pay | Admitting: Orthopedic Surgery

## 2020-12-26 ENCOUNTER — Telehealth (HOSPITAL_COMMUNITY): Payer: Self-pay | Admitting: Physician Assistant

## 2020-12-26 ENCOUNTER — Telehealth: Payer: Self-pay | Admitting: *Deleted

## 2020-12-26 LAB — RESP PANEL BY RT-PCR (FLU A&B, COVID) ARPGX2
Influenza A by PCR: NEGATIVE
Influenza B by PCR: NEGATIVE
SARS Coronavirus 2 by RT PCR: NEGATIVE

## 2020-12-26 MED ORDER — AMOXICILLIN-POT CLAVULANATE 875-125 MG PO TABS: 1.0000 | ORAL_TABLET | Freq: Two times a day (BID) | ORAL | 0 refills | Status: AC

## 2020-12-26 MED ORDER — HYDROCODONE-ACETAMINOPHEN 5-325 MG PO TABS: 1.0000 | ORAL_TABLET | ORAL | 0 refills | Status: DC | PRN
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
## 2020-12-26 NOTE — Progress Notes (Signed)
Orthopedic Tech Progress Note Patient Details:  Howard Newman 1981/02/19 [id number]  Ortho Devices Type of Ortho Device: Ulna gutter splint Ortho Device/Splint Location: LUE Ortho Device/Splint Interventions: Ordered,Application   Post Interventions Patient Tolerated: Well Instructions Provided: Care of device   Ainsley Deakins 12/26/2020, 12:00 PM

## 2020-12-26 NOTE — Telephone Encounter (Signed)
Pt called regarding [facility] not having narcotic in stock.  RNCM relayed information to EDP.  EDP will send to alternate pharmacy of choice.

## 2020-12-26 NOTE — Progress Notes (Signed)
Orthopedic Tech Progress Note Patient Details:  Howard Newman [birth year] [id number]  Ortho Devices Type of Ortho Device: Finger splint Ortho Device/Splint Location: 5th LUE Ortho Device/Splint Interventions: Application,Ordered,Adjustment   Post Interventions Patient Tolerated: Well Instructions Provided: Adjustment of device   Satoru Milich A [name] 12/26/2020, 7:45 AM

## 2020-12-26 NOTE — Telephone Encounter (Signed)
Pharmacy changed.  Original pharmacy out of hydrocodone

## 2020-12-26 NOTE — ED Notes (Signed)
Patient refused COVID testing at this time.
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
## 2020-12-29 ENCOUNTER — Ambulatory Visit (HOSPITAL_BASED_OUTPATIENT_CLINIC_OR_DEPARTMENT_OTHER): Payer: Self-pay | Admitting: Certified Registered"

## 2020-12-29 ENCOUNTER — Encounter (HOSPITAL_BASED_OUTPATIENT_CLINIC_OR_DEPARTMENT_OTHER)
Admission: RE | Disposition: A | Discharge status: Home / Self Care | Payer: Self-pay | Source: Home / Self Care | Attending: Orthopedic Surgery

## 2020-12-29 ENCOUNTER — Ambulatory Visit (HOSPITAL_BASED_OUTPATIENT_CLINIC_OR_DEPARTMENT_OTHER)
Admission: RE | Admit: 2020-12-29 | Discharge: 2020-12-29 | Disposition: A | Discharge status: Home / Self Care | Payer: Self-pay | Attending: Orthopedic Surgery | Admitting: Orthopedic Surgery

## 2020-12-29 ENCOUNTER — Other Ambulatory Visit: Payer: Self-pay

## 2020-12-29 ENCOUNTER — Encounter (HOSPITAL_BASED_OUTPATIENT_CLINIC_OR_DEPARTMENT_OTHER): Payer: Self-pay | Admitting: Orthopedic Surgery

## 2020-12-29 ENCOUNTER — Ambulatory Visit (HOSPITAL_COMMUNITY): Payer: Self-pay

## 2020-12-29 DIAGNOSIS — F17210 Nicotine dependence, cigarettes, uncomplicated: Secondary | ICD-10-CM | POA: Insufficient documentation

## 2020-12-29 DIAGNOSIS — Z833 Family history of diabetes mellitus: Secondary | ICD-10-CM | POA: Insufficient documentation

## 2020-12-29 DIAGNOSIS — Z809 Family history of malignant neoplasm, unspecified: Secondary | ICD-10-CM | POA: Insufficient documentation

## 2020-12-29 DIAGNOSIS — S62617A Displaced fracture of proximal phalanx of left little finger, initial encounter for closed fracture: Secondary | ICD-10-CM | POA: Insufficient documentation

## 2020-12-29 DIAGNOSIS — J342 Deviated nasal septum: Secondary | ICD-10-CM | POA: Insufficient documentation

## 2020-12-29 DIAGNOSIS — S0232XA Fracture of orbital floor, left side, initial encounter for closed fracture: Secondary | ICD-10-CM | POA: Insufficient documentation

## 2020-12-29 DIAGNOSIS — Z8249 Family history of ischemic heart disease and other diseases of the circulatory system: Secondary | ICD-10-CM | POA: Insufficient documentation

## 2020-12-29 DIAGNOSIS — F17290 Nicotine dependence, other tobacco product, uncomplicated: Secondary | ICD-10-CM | POA: Insufficient documentation

## 2020-12-29 DIAGNOSIS — Z419 Encounter for procedure for purposes other than remedying health state, unspecified: Secondary | ICD-10-CM

## 2020-12-29 DIAGNOSIS — S0240FA Zygomatic fracture, left side, initial encounter for closed fracture: Secondary | ICD-10-CM | POA: Insufficient documentation

## 2020-12-29 DIAGNOSIS — Z20822 Contact with and (suspected) exposure to covid-19: Secondary | ICD-10-CM | POA: Insufficient documentation

## 2020-12-29 HISTORY — PX: OPEN REDUCTION INTERNAL FIXATION (ORIF) PROXIMAL PHALANX: SHX6235

## 2020-12-29 HISTORY — DX: Other allergy status, other than to drugs and biological substances: Z91.09

## 2020-12-29 SURGERY — OPEN REDUCTION INTERNAL FIXATION (ORIF) PROXIMAL PHALANX
Anesthesia: Monitor Anesthesia Care | Site: Hand | Laterality: Left

## 2020-12-29 MED ORDER — IBUPROFEN 200 MG PO TABS: 600.0000 mg | ORAL_TABLET | Freq: Four times a day (QID) | ORAL | Status: DC

## 2020-12-29 MED ORDER — ONDANSETRON HCL 4 MG/2ML IJ SOLN
INTRAMUSCULAR | Status: DC | PRN
  Administered 2020-12-29: 4 mg via INTRAVENOUS

## 2020-12-29 MED ORDER — OXYCODONE HCL 5 MG PO TABS: 5.0000 mg | ORAL_TABLET | Freq: Four times a day (QID) | ORAL | 0 refills | Status: DC | PRN

## 2020-12-29 MED ORDER — CEFAZOLIN SODIUM-DEXTROSE 2-4 GM/100ML-% IV SOLN
INTRAVENOUS | Status: AC
  Filled 2020-12-29: qty 100

## 2020-12-29 MED ORDER — PROPOFOL 500 MG/50ML IV EMUL
INTRAVENOUS | Status: DC | PRN
  Administered 2020-12-29: 120 ug/kg/min via INTRAVENOUS

## 2020-12-29 MED ORDER — ACETAMINOPHEN 325 MG PO TABS: 650.0000 mg | ORAL_TABLET | Freq: Four times a day (QID) | ORAL | Status: DC

## 2020-12-29 MED ORDER — PROPOFOL 500 MG/50ML IV EMUL
INTRAVENOUS | Status: AC
  Filled 2020-12-29: qty 50

## 2020-12-29 MED ORDER — PROPOFOL 10 MG/ML IV BOLUS
INTRAVENOUS | Status: DC | PRN
  Administered 2020-12-29 (×3): 20 mg via INTRAVENOUS

## 2020-12-29 MED ORDER — LACTATED RINGERS IV SOLN: INTRAVENOUS | Status: DC

## 2020-12-29 MED ORDER — MIDAZOLAM HCL 2 MG/2ML IJ SOLN
INTRAMUSCULAR | Status: AC
  Filled 2020-12-29: qty 2

## 2020-12-29 MED ORDER — FENTANYL CITRATE (PF) 100 MCG/2ML IJ SOLN
INTRAMUSCULAR | Status: AC
  Filled 2020-12-29: qty 2

## 2020-12-29 MED ORDER — CEFAZOLIN SODIUM-DEXTROSE 2-4 GM/100ML-% IV SOLN
2.0000 g | INTRAVENOUS | Status: AC
  Administered 2020-12-29: 2 g via INTRAVENOUS

## 2020-12-29 MED ORDER — ROPIVACAINE HCL 5 MG/ML IJ SOLN
INTRAMUSCULAR | Status: DC | PRN
  Administered 2020-12-29: 30 mL via PERINEURAL

## 2020-12-29 MED ORDER — MIDAZOLAM HCL 2 MG/2ML IJ SOLN
2.0000 mg | Freq: Once | INTRAMUSCULAR | Status: AC
  Administered 2020-12-29: 2 mg via INTRAVENOUS

## 2020-12-29 MED ORDER — FENTANYL CITRATE (PF) 100 MCG/2ML IJ SOLN
100.0000 ug | Freq: Once | INTRAMUSCULAR | Status: AC
Start: 2020-12-29 — End: 2020-12-29
  Administered 2020-12-29: 100 ug via INTRAVENOUS

## 2020-12-29 MED ORDER — ONDANSETRON HCL 4 MG/2ML IJ SOLN
INTRAMUSCULAR | Status: AC
  Filled 2020-12-29: qty 2

## 2020-12-29 SURGICAL SUPPLY — 65 items
APL PRP STRL LF DISP 70% ISPRP (MISCELLANEOUS) ×1
BAND INSRT 18 STRL LF DISP RB (MISCELLANEOUS)
BAND RUBBER #18 3X1/16 STRL (MISCELLANEOUS) ×1 IMPLANT
BIT DRILL 1.1X60MM (BIT) IMPLANT
BLADE MINI RND TIP GREEN BEAV (BLADE) IMPLANT
BLADE SURG 15 STRL LF DISP TIS (BLADE) ×1 IMPLANT
BLADE SURG 15 STRL SS (BLADE) ×2
BNDG CMPR 9X4 STRL LF SNTH (GAUZE/BANDAGES/DRESSINGS) ×1
BNDG COHESIVE 2X5 TAN STRL LF (GAUZE/BANDAGES/DRESSINGS) IMPLANT
BNDG COHESIVE 4X5 TAN STRL (GAUZE/BANDAGES/DRESSINGS) ×2 IMPLANT
BNDG ESMARK 4X9 LF (GAUZE/BANDAGES/DRESSINGS) ×1 IMPLANT
BNDG GAUZE ELAST 4 BULKY (GAUZE/BANDAGES/DRESSINGS) ×2 IMPLANT
CAP PIN PROTECTOR ORTHO WHT (CAP) IMPLANT
CHLORAPREP W/TINT 26 (MISCELLANEOUS) ×2 IMPLANT
CORD BIPOLAR FORCEPS 12FT (ELECTRODE) ×2 IMPLANT
COVER BACK TABLE 60X90IN (DRAPES) ×2 IMPLANT
COVER MAYO STAND STRL (DRAPES) ×2 IMPLANT
COVER WAND RF STERILE (DRAPES) IMPLANT
CUFF TOURN SGL QUICK 18X4 (TOURNIQUET CUFF) ×1 IMPLANT
DRAPE C-ARM 42X72 X-RAY (DRAPES) ×2 IMPLANT
DRAPE EXTREMITY T 121X128X90 (DISPOSABLE) ×2 IMPLANT
DRAPE SURG 17X23 STRL (DRAPES) ×2 IMPLANT
DRILL 1.1X60MM (BIT) ×2
DRIVER BIT 1.5 (TRAUMA) ×1 IMPLANT
DRSG EMULSION OIL 3X3 NADH (GAUZE/BANDAGES/DRESSINGS) ×2 IMPLANT
GAUZE SPONGE 4X4 12PLY STRL LF (GAUZE/BANDAGES/DRESSINGS) ×2 IMPLANT
GLOVE SRG 8 PF TXTR STRL LF DI (GLOVE) ×1 IMPLANT
GLOVE SURG ENC MOIS LTX SZ7.5 (GLOVE) ×2 IMPLANT
GLOVE SURG LTX SZ6.5 (GLOVE) ×2 IMPLANT
GLOVE SURG UNDER POLY LF SZ7 (GLOVE) ×2 IMPLANT
GLOVE SURG UNDER POLY LF SZ8 (GLOVE) ×2
GOWN STRL REUS W/ TWL LRG LVL3 (GOWN DISPOSABLE) ×2 IMPLANT
GOWN STRL REUS W/ TWL XL LVL3 (GOWN DISPOSABLE) IMPLANT
GOWN STRL REUS W/TWL LRG LVL3 (GOWN DISPOSABLE) ×2
GOWN STRL REUS W/TWL XL LVL3 (GOWN DISPOSABLE) ×4 IMPLANT
K-WIRE .045X4 (WIRE) IMPLANT
K-WIRE 9  SMOOTH .045 (WIRE) IMPLANT
K-WIRE DBL TRONS .035X6 (WIRE) ×2
KWIRE DBL TRONS .035X6 (WIRE) IMPLANT
NDL HYPO 25X1 1.5 SAFETY (NEEDLE) IMPLANT
NEEDLE HYPO 25X1 1.5 SAFETY (NEEDLE) IMPLANT
NS IRRIG 1000ML POUR BTL (IV SOLUTION) ×2 IMPLANT
PACK BASIN DAY SURGERY FS (CUSTOM PROCEDURE TRAY) ×2 IMPLANT
PADDING CAST ABS 4INX4YD NS (CAST SUPPLIES) ×1
PADDING CAST ABS COTTON 4X4 ST (CAST SUPPLIES) IMPLANT
PLATE T SMALL 1.5MM (Plate) ×1 IMPLANT
SCREW L 1.5X12 (Screw) ×1 IMPLANT
SCREW LOCKING 1.5X10 (Screw) ×4 IMPLANT
SCREW LOCKING 1.5X13MM (Screw) ×2 IMPLANT
SCREW LOCKING 1.5X8 (Screw) ×1 IMPLANT
SLING ARM FOAM STRAP LRG (SOFTGOODS) ×1 IMPLANT
SPLINT PLASTER CAST XFAST 3X15 (CAST SUPPLIES) IMPLANT
SPLINT PLASTER XTRA FASTSET 3X (CAST SUPPLIES)
STOCKINETTE 6  STRL (DRAPES) ×2
STOCKINETTE 6 STRL (DRAPES) ×1 IMPLANT
SUCTION FRAZIER HANDLE 10FR (MISCELLANEOUS) ×2
SUCTION TUBE FRAZIER 10FR DISP (MISCELLANEOUS) IMPLANT
SUT VICRYL 4-0 PS2 18IN ABS (SUTURE) ×1 IMPLANT
SUT VICRYL RAPIDE 4-0 (SUTURE) ×1 IMPLANT
SUT VICRYL RAPIDE 4/0 PS 2 (SUTURE) ×1 IMPLANT
SYR 10ML LL (SYRINGE) IMPLANT
SYR BULB EAR ULCER 3OZ GRN STR (SYRINGE) IMPLANT
TOWEL GREEN STERILE FF (TOWEL DISPOSABLE) ×2 IMPLANT
TUBE CONNECTING 20X1/4 (TUBING) ×1 IMPLANT
UNDERPAD 30X36 HEAVY ABSORB (UNDERPADS AND DIAPERS) ×2 IMPLANT

## 2020-12-29 NOTE — Anesthesia Postprocedure Evaluation (Signed)
Anesthesia Post Note  Patient: Howard Newman  Procedure(s) Performed: Open treatment of left small finger proximal phalanx fracture (Left Hand)     Patient location during evaluation: PACU Anesthesia Type: Regional Level of consciousness: awake and alert Pain management: pain level controlled Vital Signs Assessment: post-procedure vital signs reviewed and stable Respiratory status: spontaneous breathing, nonlabored ventilation and respiratory function stable Cardiovascular status: blood pressure returned to baseline and stable Postop Assessment: no apparent nausea or vomiting Anesthetic complications: no   No complications documented.  Last Vitals:  Vitals:   12/29/20 1245 12/29/20 1300  BP: (!) 123/95 (!) 122/92  Pulse: 74 77  Resp: 16   Temp: (!) 36.2 C   SpO2: 99% 99%    Last Pain:  Vitals:   12/29/20 1300  TempSrc:   PainSc: 0-No pain                 Lowella Curb

## 2020-12-29 NOTE — Discharge Instructions (Signed)
Discharge Instructions   You have a dressing with a plaster splint incorporated in it. Move your fingers as much as possible, making a full fist and fully opening the fist. Elevate your hand to reduce pain & swelling of the digits.  Ice over the operative site may be helpful to reduce pain & swelling.  DO NOT USE HEAT. Pain medicine has been prescribed for you.  Take Tylenol 650 mg (unless you continue the hydrocodone 5/325, then only take 325mg  of Tylenol) and Ibuprofen 600 mg over the counter every 6 hours. Take the prescribed pain medicine additionally for severe post operative pain. Leave the dressing in place until you return to our office.  You may shower, but keep the bandage clean & dry.  You may drive a car when you are off of prescription pain medications and can safely control your vehicle with both hands. Our office will call you to arrange follow-up   Please call [phone] during normal business hours or [phone] after hours for any problems. Including the following:  - excessive redness of the incisions - drainage for more than 4 days - fever of more than 101.5 F  *Please note that pain medications will not be refilled after hours or on weekends.  WORK STATUS: NO WORK WITH THE LEFT HAND UNTIL 1st POST OPERATIVE appointment.   Post Anesthesia Home Care Instructions  Activity: Get plenty of rest for the remainder of the day. A responsible individual must stay with you for 24 hours following the procedure.  For the next 24 hours, DO NOT: -Drive a car -[phone] -Drink alcoholic beverages -Take any medication unless instructed by your physician -Make any legal decisions or sign important papers.  Meals: Start with liquid foods such as gelatin or soup. Progress to regular foods as tolerated. Avoid greasy, spicy, heavy foods. If nausea and/or vomiting occur, drink only clear liquids until the nausea and/or vomiting subsides. Call your physician if vomiting  continues.  Special Instructions/Symptoms: Your throat may feel dry or sore from the anesthesia or the breathing tube placed in your throat during surgery. If this causes discomfort, gargle with warm salt water. The discomfort should disappear within 24 hours.  If you had a scopolamine patch placed behind your ear for the management of post- operative nausea and/or vomiting:  1. The medication in the patch is effective for 72 hours, after which it should be removed.  Wrap patch in a tissue and discard in the trash. Wash hands thoroughly with soap and water. 2. You may remove the patch earlier than 72 hours if you experience unpleasant side effects which may include dry mouth, dizziness or visual disturbances. 3. Avoid touching the patch. Wash your hands with soap and water after contact with the patch.     Regional Anesthesia Blocks  1. Numbness or the inability to move the "blocked" extremity may last from 3-48 hours after placement. The length of time depends on the medication injected and your individual response to the medication. If the numbness is not going away after 48 hours, call your surgeon.  2. The extremity that is blocked will need to be protected until the numbness is gone and the  Strength has returned. Because you cannot feel it, you will need to take extra care to avoid injury. Because it may be weak, you may have difficulty moving it or using it. You may not know what position it is in without looking at it while the block is in effect.  3.  For blocks in the legs and feet, returning to weight bearing and walking needs to be done carefully. You will need to wait until the numbness is entirely gone and the strength has returned. You should be able to move your leg and foot normally before you try and bear weight or walk. You will need someone to be with you when you first try to ensure you do not fall and possibly risk injury.  4. Bruising and tenderness at the needle site are  common side effects and will resolve in a few days.  5. Persistent numbness or new problems with movement should be communicated to the surgeon or the [facility] [phone] [facility] [phone]).

## 2020-12-29 NOTE — Anesthesia Procedure Notes (Signed)
Anesthesia Regional Block: Supraclavicular block   Pre-Anesthetic Checklist: ,, timeout performed, Correct Patient, Correct Site, Correct Laterality, Correct Procedure, Correct Position, site marked, Risks and benefits discussed,  Surgical consent,  Pre-op evaluation,  At surgeon's request and post-op pain management  Laterality: Left  Prep: chloraprep       Needles:  Injection technique: Single-shot  Needle Type: Stimiplex     Needle Length: 9cm  Needle Gauge: 21     Additional Needles:   Procedures:,,,, ultrasound used (permanent image in chart),,,,  Narrative:  Start time: 12/29/2020 10:18 AM End time: 12/29/2020 10:23 AM Injection made incrementally with aspirations every 5 mL.  Performed by: Personally  Anesthesiologist: Lowella Curb, MD

## 2020-12-29 NOTE — Anesthesia Preprocedure Evaluation (Signed)
Anesthesia Evaluation  Patient identified by MRN, date of birth, ID band Patient awake    Reviewed: Allergy & Precautions, NPO status , Patient's Chart, lab work & pertinent test results  Airway Mallampati: II  TM Distance: >3 FB Neck ROM: Full    Dental no notable dental hx.    Pulmonary neg pulmonary ROS, Current Smoker and Patient abstained from smoking.,    Pulmonary exam normal breath sounds clear to auscultation       Cardiovascular negative cardio ROS Normal cardiovascular exam Rhythm:Regular Rate:Normal     Neuro/Psych negative neurological ROS  negative psych ROS   GI/Hepatic negative GI ROS, Neg liver ROS,   Endo/Other  negative endocrine ROS  Renal/GU negative Renal ROS  negative genitourinary   Musculoskeletal negative musculoskeletal ROS (+)   Abdominal   Peds negative pediatric ROS (+)  Hematology negative hematology ROS (+)   Anesthesia Other Findings   Reproductive/Obstetrics negative OB ROS                             Anesthesia Physical Anesthesia Plan  ASA: II  Anesthesia Plan: MAC and Regional   Post-op Pain Management:    Induction: Intravenous  PONV Risk Score and Plan: 0 and Treatment may vary due to age or medical condition  Airway Management Planned: Simple Face Mask  Additional Equipment:   Intra-op Plan:   Post-operative Plan:   Informed Consent: I have reviewed the patients History and Physical, chart, labs and discussed the procedure including the risks, benefits and alternatives for the proposed anesthesia with the patient or authorized representative who has indicated his/her understanding and acceptance.     Dental advisory given  Plan Discussed with: CRNA  Anesthesia Plan Comments:         Anesthesia Quick Evaluation

## 2020-12-29 NOTE — Op Note (Signed)
12/29/2020  11:22 AM  PATIENT:  Howard Newman  40 y.o. male  PRE-OPERATIVE DIAGNOSIS:  Displaced L SF P1 fx  POST-OPERATIVE DIAGNOSIS:  Same  PROCEDURE:  ORIF L SF P1 fx  SURGEON: Rayvon Char. Grandville Silos, MD  PHYSICIAN ASSISTANT: Morley Kos, OPA-C  ANESTHESIA:  regional and MAC  SPECIMENS:  None  DRAINS:   None  EBL:  less than 50 mL  PREOPERATIVE INDICATIONS:  Howard Newman is a  40 y.o. male with a displaced L SF P1 fx  The risks benefits and alternatives were discussed with the patient preoperatively including but not limited to the risks of infection, bleeding, nerve injury, cardiopulmonary complications, the need for revision surgery, among others, and the patient verbalized understanding and consented to proceed.  OPERATIVE IMPLANTS: Bioment ALPS 1.44m plate/screws  OPERATIVE PROCEDURE:  After receiving prophylactic antibiotics and a regional block, the patient was escorted to the operative theatre and placed in a supine position.  A surgical "time-out" was performed during which the planned procedure, proposed operative site, and the correct patient identity were compared to the operative consent and agreement confirmed by the circulating nurse according to current facility policy.  Following application of a tourniquet to the operative extremity, the exposed skin was prescrubbed with a Hibiclens scrub brush before being formally prepped with Chloraprep and draped in the usual sterile fashion.  The limb was exsanguinated with an Esmarch bandage and the tourniquet inflated to approximately 1052mg higher than systolic BP.  A dorsal approach was made, slightly off the midline, elevating a full-thickness flap.  The extensor apparatus was split in the midline and reflected.  The periosteum was incised longitudinally, again off the midline and reflected radially and ulnarly.  The fracture was identified, cleaned, and reduced and provisional fixation applied.  The digit was  assessed for angular and rotatory alignment, and definitive fixation placed, 1.5 mm small T plate from the Biomet ALPS handset with associated locking screws.  Hardware position and alignment and screw lengths were checked fluoroscopically.  Alignment was judged to be near-anatomic and acceptable with regard to angular rotatory component.  Final images were obtained.  The wound was copiously irrigated and closed in layers, first with the periosteum over the hardware using 4-0 Vicryl Rapide interrupted sutures.  The extensor apparatus was then repaired with a running 4-0 Vicryl suture followed by release of the tourniquet.  Additional hemostasis was obtained with bipolar cautery and the skin was closed with 4-0 Vicryl Rapide running horizontal mattress suture.  A short arm dressing was applied and he was taken to recovery room in stable condition.  DISPOSITION: He will be discharged home today, returning to therapy in a few days to have a splint fabricated and begin rehab, then 10 to 15 days of aspirin x-rays of the left small finger out of splint

## 2020-12-29 NOTE — Interval H&P Note (Signed)
History and Physical Interval Note:  12/29/2020 11:21 AM  Howard Newman  has presented today for surgery, with the diagnosis of left small finger proximal phalanx fracture.  The various methods of treatment have been discussed with the patient and family. After consideration of risks, benefits and other options for treatment, the patient has consented to  Procedure(s) with comments: Open treatment of left small finger proximal phalanx fracture (Left) - with preop regional block as a surgical intervention.  The patient's history has been reviewed, patient examined, no change in status, stable for surgery.  I have reviewed the patient's chart and labs.  Questions were answered to the patient's satisfaction.     [name]

## 2020-12-29 NOTE — Transfer of Care (Signed)
Immediate Anesthesia Transfer of Care Note  Patient: Howard Newman  Procedure(s) Performed: Open treatment of left small finger proximal phalanx fracture (Left Hand)  Patient Location: PACU  Anesthesia Type:MAC and Regional  Level of Consciousness: awake  Airway & Oxygen Therapy: Patient Spontanous Breathing  Post-op Assessment: Report given to RN and Post -op Vital signs reviewed and stable  Post vital signs: Reviewed and stable  Last Vitals:  Vitals Value Taken Time  BP    Temp    Pulse    Resp    SpO2      Last Pain:  Vitals:   12/29/20 0952  TempSrc:   PainSc: 0-No pain         Complications: No complications documented.

## 2020-12-29 NOTE — Progress Notes (Signed)
Assisted Dr. Miller with left, ultrasound guided, supraclavicular block. Side rails up, monitors on throughout procedure. See vital signs in flow sheet. Tolerated Procedure well. 

## 2020-12-30 ENCOUNTER — Encounter (HOSPITAL_BASED_OUTPATIENT_CLINIC_OR_DEPARTMENT_OTHER): Payer: Self-pay | Admitting: Orthopedic Surgery

## 2021-01-05 ENCOUNTER — Other Ambulatory Visit: Payer: Self-pay

## 2021-01-05 ENCOUNTER — Ambulatory Visit: Payer: Self-pay | Admitting: Occupational Therapy

## 2021-01-06 ENCOUNTER — Other Ambulatory Visit: Payer: Self-pay

## 2021-01-06 ENCOUNTER — Ambulatory Visit: Payer: Self-pay | Attending: Orthopedic Surgery | Admitting: Occupational Therapy

## 2021-01-06 DIAGNOSIS — M25642 Stiffness of left hand, not elsewhere classified: Secondary | ICD-10-CM

## 2021-01-06 DIAGNOSIS — M6281 Muscle weakness (generalized): Secondary | ICD-10-CM

## 2021-01-06 DIAGNOSIS — R600 Localized edema: Secondary | ICD-10-CM

## 2021-01-06 DIAGNOSIS — M25542 Pain in joints of left hand: Secondary | ICD-10-CM

## 2021-01-06 NOTE — Patient Instructions (Addendum)
SPLINT WEAR AND CARE:   WEARING SCHEDULE:  Wear splint at ALL times except for hygiene care (May remove splint for exercises and then immediately place back on ONLY if directed by the therapist)  PURPOSE:  To prevent movement and for protection until injury can heal  CARE OF SPLINT:  Keep splint away from heat sources including: stove, radiator or furnace, or a car in sunlight. The splint can melt and will no longer fit you properly  Keep away from pets and children  Clean the splint with rubbing alcohol 1-2 times per day.  * During this time, make sure you also clean your hand/arm as instructed by your therapist and/or perform dressing changes as needed. Then dry hand/arm completely before replacing splint. (When cleaning hand/arm, keep it immobilized in same position until splint is replaced)  PRECAUTIONS/POTENTIAL PROBLEMS: *If you notice or experience increased pain, swelling, numbness, or a lingering reddened area from the splint: Contact your therapist immediately by calling [phone]. You must wear the splint for protection, but we will get you scheduled for adjustments as quickly as possible.  (If only straps or hooks need to be replaced and NO adjustments to the splint need to be made, just call the office ahead and let them know you are coming in)  If you have any medical concerns or signs of infection, please call your doctor immediately  EXERCISES:   **Keep splint on to move shoulder, elbow and wrist up and down, then remove splint to do below ex's 4-6 times per day (about every 2-3 hours)   Finger Flexion / Extension   Bend fingers of left hand toward palm, making a 1/2 a fist. Straighten fingers, opening hand fully. In 3-4 days, make full fist.  Repeat sequence _15___ times per session. Do _4-6__ sessions per day.   Flexor Tendon Gliding (Active Hook Fist)   With fingers and knuckles straight, bend middle and tip joints. Do not bend large knuckles. Repeat _15___  times. Do _4-6___ sessions per day.  DIP Flexion (Active Blocked)    Hold __small and ring____ fingers firmly at the middle so that only the tip joint can bend. Hold _5___ seconds, then STRAIGHTEN ALL THE WAY. Repeat _15___ times. Do _4-6___ sessions per day.     MP Flexion (Active)   With back of hand on table, bend large knuckles down and hold with other hand, then bend middle and tip joints 1/2 way, then STRAIGHTEN middle and tip joints as far as you can while keeping big knuckles bent.  Repeat _15___ times. Do __4-6__ sessions per day.

## 2021-01-06 NOTE — Therapy (Signed)
[facility] [address] [place], [place], [address] Phone: [phone]   Fax:  [phone]  Occupational Therapy Evaluation  Patient Details  Name: Howard Newman MRN: [id number] Date of Birth: [birth year] Referring Provider (OT): Dr. Janee Morn   Encounter Date: 01/06/2021   OT End of Session - 01/06/21 1244    Visit Number 1    Number of Visits 16    Date for OT Re-Evaluation 03/08/21    Authorization Type SELF PAY    OT Start Time 0930    OT Stop Time 1100    OT Time Calculation (min) 90 min    Activity Tolerance Patient tolerated treatment well    Behavior During Therapy [facility] for tasks assessed/performed           Past Medical History:  Diagnosis Date  . Dental disease   . Environmental allergies     Past Surgical History:  Procedure Laterality Date  . MOUTH SURGERY    . OPEN REDUCTION INTERNAL FIXATION (ORIF) PROXIMAL PHALANX Left 12/29/2020   Procedure: Open treatment of left small finger proximal phalanx fracture;  Surgeon: Mack Hook, MD;  Location: [facility];  Service: Orthopedics;  Laterality: Left;  with preop regional block    There were no vitals filed for this visit.   Subjective Assessment - 01/06/21 0939    Pertinent History ORIF Lt SF P1 Fx 12/29/20    Limitations no P/ROM or no strengthening    Currently in Pain? Yes    Pain Score --   fluctuates - no pain at rest, but pain when therapist had to mold splint   Pain Location --   small finger   Pain Orientation Left    Pain Descriptors / Indicators Aching;Throbbing    Pain Type Acute pain;Surgical pain    Pain Onset In the past 7 days    Pain Frequency Intermittent    Aggravating Factors  molding splint, ROM    Pain Relieving Factors rest, OTC meds             [facility] OT Assessment - 01/06/21 0001      Assessment   Medical Diagnosis ORIF Lt small finger P1 fx    Referring Provider (OT) Dr. 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 Pt currently on short term disability    Leisure sports      ADL   ADL comments Pt requiring mod assist currently to dress/bathe - pt receiving assist from family      Written Expression   Dominant Hand Right    Handwriting --   unchanged     Edema   Edema mild Rt hand                    OT Treatments/Exercises (OP) - 01/06/21 0001      ADLs   ADL Comments Carefully removed bandages/wrapping from surgery  and allowed pt to wash hands prior to splint fabrication. Pt instructed in hygiene care, splint wear and care, and current precautions      Exercises   Exercises Hand      Hand Exercises   Other Hand Exercises Initiated A/ROM HEP (per protocol) to include mid-arc active flexion, full extension, blocking and reverse blocking ex's. Pt required mod to max cueing. Issued buddy strap for small to ring finger when doing A/ROM HEP as this improved ROM. Pt also instructed to keep shoulder, elbow, and wrist moving t/o day and he can keep splint on for these more proximal ex's. Pt to remove splint and use buddy strap for finger/hand ex's      Splinting   Splinting Fabricated and fitted custom hand based safe position splint to include small and ring finger, MP's slightly flexed w/ IP's fully extended to avoid PIPextensor lag. Issued splint and reviewed wear and care                 OT Education - 01/06/21 1242    Education Details splint wear and care,  hand hygiene, precautions, initial A/ROM HEP    Person(s) Educated Patient    Methods Explanation;Demonstration;Verbal cues;Handout;Tactile cues    Comprehension Verbalized understanding;Returned demonstration;Verbal cues required;Need further instruction;Tactile cues required            OT Short Term Goals - 01/06/21 1250      OT SHORT TERM GOAL #1   Title Independent with splint wear and care    Time 4    Period Weeks    Status On-going      OT SHORT TERM GOAL #2   Title Independent with HEP    Time 4    Period Weeks    Status On-going      OT SHORT TERM GOAL #3   Title Pt to demo no more than 10* PIP extensor lag Lt small finger    Time 4    Period Weeks    Status New      OT SHORT TERM GOAL #4   Title Pt to demo 90% or greater full composite flexion and extension Lt hand    Time 4    Period Weeks    Status New      OT SHORT TERM GOAL #5   Title Pt to verbalize understanding with edema management strategies Lt hand    Time 4    Period Weeks    Status New             OT Long Term Goals - 01/06/21 1252      OT LONG TERM GOAL #1   Title Pt to verbalize understanding with strengthening HEP for Lt hand    Time 8    Period Weeks    Status New      OT LONG TERM GOAL #2   Title Pt to return to using Lt hand as assist for all ADLS    Time 8    Period Weeks    Status New      OT LONG TERM GOAL #3   Title Grip strength to be 50% or > Lt hand compared to Rt hand    Time 8    Period Weeks    Status New      OT LONG TERM GOAL #4   Title Pain Lt hand to be less than or equal to 3/10 with all functional tasks    Time 8    Period  Weeks    Status New                 Plan - 01/06/21 1244    Clinical Impression Statement Pt is a 39 y.o. male who presents to OPOT s/p ORIF Lt small finger P1 (proximal phalanx) fracture on 12/29/20. Pt arrives today fully wrapped and protected from surgery for evaluation, splint fabrication, and initiation of therapy. Pt  with deficits in ROM including PIP and DIP extensor lag, edema, stiffness, pain, and weakness. Pt would benefit from O.T. to address these deficits and return pt to PLOF    OT Occupational Profile and History Problem Focused Assessment - Including review of records relating to presenting problem    Occupational performance deficits (Please refer to evaluation for details): ADL's;Work;Leisure    Body Structure / Function / Physical Skills ADL;Strength;Dexterity;GMC;Pain;Edema;UE functional use;IADL;ROM;Scar mobility;Coordination;Flexibility;Sensation;FMC;Decreased knowledge of precautions    Rehab Potential Good    Clinical Decision Making Several treatment options, min-mod task modification necessary    Comorbidities Affecting Occupational Performance: None    Modification or Assistance to Complete Evaluation  Min-Moderate modification of tasks or assist with assess necessary to complete eval    OT Frequency 2x / week   however, may come 1x/wk at times due to self pay/financial concerns   OT Duration 8 weeks    OT Treatment/Interventions Self-care/ADL training;Moist Heat;Fluidtherapy;Splinting;DME and/or AE instruction;Contrast Bath;Compression bandaging;Therapeutic activities;Therapeutic exercise;Ultrasound;Scar mobilization;Coping strategies training;Passive range of motion;Cryotherapy;Electrical Stimulation;Paraffin;Manual Therapy;Patient/family education    Plan splint adjustments prn, review HEP and add self P/ROM and full-arc active flexion per protocol, assess ROM if able (unable to do evaluation day due to time constraints)    Consulted and Agree with Plan of Care Patient           Patient will benefit from skilled therapeutic intervention in order to improve the following deficits and impairments:   Body Structure / Function / Physical Skills: ADL,Strength,Dexterity,GMC,Pain,Edema,UE functional use,IADL,ROM,Scar mobility,Coordination,Flexibility,Sensation,FMC,Decreased knowledge of  precautions       Visit Diagnosis: Stiffness of hand joint, left - Plan: Ot plan of care cert/re-cert  Pain in joint of left hand - Plan: Ot plan of care cert/re-cert  Muscle weakness (generalized) - Plan: Ot plan of care cert/re-cert  Localized edema - Plan: Ot plan of care cert/re-cert    Problem List There are no problems to display for this patient.   Kelli Churn, OTR/L 01/06/2021, 1:12 PM  [facility] [address] [place], [place], [address] Phone: [phone]   Fax:  [phone]  Name: Howard Newman MRN: [id number] Date of Birth: 17-May-1981

## 2021-01-09 ENCOUNTER — Ambulatory Visit: Payer: Self-pay | Attending: Orthopedic Surgery | Admitting: Occupational Therapy

## 2021-01-09 ENCOUNTER — Other Ambulatory Visit: Payer: Self-pay

## 2021-01-09 DIAGNOSIS — R600 Localized edema: Secondary | ICD-10-CM | POA: Insufficient documentation

## 2021-01-09 DIAGNOSIS — M25542 Pain in joints of left hand: Secondary | ICD-10-CM | POA: Insufficient documentation

## 2021-01-09 DIAGNOSIS — M25642 Stiffness of left hand, not elsewhere classified: Secondary | ICD-10-CM | POA: Insufficient documentation

## 2021-01-09 DIAGNOSIS — M6281 Muscle weakness (generalized): Secondary | ICD-10-CM | POA: Insufficient documentation

## 2021-01-09 NOTE — Therapy (Signed)
[facility] [address] [place], [place], [address] Phone: [phone]   Fax:  [phone]  Occupational Therapy Treatment  Patient Details  Name: Howard Newman MRN: [id number] Date of Birth: 18-Feb-1981 Referring Provider (OT): Dr. Janee Morn   Encounter Date: 01/09/2021   OT End of Session - 01/09/21 0824    Visit Number 2    Number of Visits 16    Date for OT Re-Evaluation 03/08/21    Authorization Type SELF PAY    OT Start Time 0725    OT Stop Time 0803    OT Time Calculation (min) 38 min           Past Medical History:  Diagnosis Date  . Dental disease   . Environmental allergies     Past Surgical History:  Procedure Laterality Date  . MOUTH SURGERY    . OPEN REDUCTION INTERNAL FIXATION (ORIF) PROXIMAL PHALANX Left 12/29/2020   Procedure: Open treatment of left small finger proximal phalanx fracture;  Surgeon: Mack Hook, MD;  Location: [facility];  Service: Orthopedics;  Laterality: Left;  with preop regional block    There were no vitals filed for this visit.       [facility] OT Assessment - 01/09/21 0001      Left Hand AROM   L Little  MCP 0-90 35 Degrees    L Little PIP 0-100 50 Degrees   -30 extension lag   L Little DIP 0-70 50 Degrees   grossly 50% composite flexion for 5th digit             Treatment: Hotpack applied to left hand x 8 mins for pain and stiffness while therapist cleaned splint.No adverse reactions. Pt reports washing hand in shower. Pt was instructed to avoid submerging hand.   Reviewed and updated HEP to include full arc ROM and self P/ROM Tensogrip applied to hand and finger stockinette to 5th digit              OT Education - 01/09/21 8250    Education Details reveiwed A/ROM exercises issued first visit, pt was instructed to begin full arch, and he was instructed in self P/ROM. Reveiwed application of splint, hand hygine.    Person(s)  Educated Patient    Methods Explanation;Demonstration;Verbal cues;Handout;Tactile cues    Comprehension Verbalized understanding;Returned demonstration;Verbal cues required;Tactile cues required            OT Short Term Goals - 01/06/21 1250      OT SHORT TERM GOAL #1   Title Independent with splint wear and care    Time 4    Period Weeks    Status On-going      OT SHORT TERM GOAL #2   Title Independent with HEP    Time 4    Period Weeks    Status On-going      OT SHORT TERM GOAL #3   Title Pt to demo no more than 10* PIP extensor lag Lt small finger    Time 4    Period Weeks    Status New      OT SHORT TERM GOAL #4   Title Pt to demo 90% or greater full composite flexion and extension Lt hand    Time 4    Period Weeks    Status New      OT SHORT TERM GOAL #5   Title Pt to verbalize understanding with edema management strategies Lt hand  Time 4    Period Weeks    Status New             OT Long Term Goals - 01/06/21 1252      OT LONG TERM GOAL #1   Title Pt to verbalize understanding with strengthening HEP for Lt hand    Time 8    Period Weeks    Status New      OT LONG TERM GOAL #2   Title Pt to return to using Lt hand as assist for all ADLS    Time 8    Period Weeks    Status New      OT LONG TERM GOAL #3   Title Grip strength to be 50% or > Lt hand compared to Rt hand    Time 8    Period Weeks    Status New      OT LONG TERM GOAL #4   Title Pain Lt hand to be less than or equal to 3/10 with all functional tasks    Time 8    Period Weeks    Status New                 Plan - 01/09/21 3559    Clinical Impression Statement Pt is progressing towards goals with improving ROM.    OT Occupational Profile and History Problem Focused Assessment - Including review of records relating to presenting problem    Occupational performance deficits (Please refer to evaluation for details): ADL's;Work;Leisure    Body Structure / Function / Physical  Skills ADL;Strength;Dexterity;GMC;Pain;Edema;UE functional use;IADL;ROM;Scar mobility;Coordination;Flexibility;Sensation;FMC;Decreased knowledge of precautions    Rehab Potential Good    Clinical Decision Making Several treatment options, min-mod task modification necessary    Comorbidities Affecting Occupational Performance: None    Modification or Assistance to Complete Evaluation  Min-Moderate modification of tasks or assist with assess necessary to complete eval    OT Frequency 2x / week   however, may come 1x/wk at times due to self pay/financial concerns   OT Duration 8 weeks    OT Treatment/Interventions Self-care/ADL training;Moist Heat;Fluidtherapy;Splinting;DME and/or AE instruction;Contrast Bath;Compression bandaging;Therapeutic activities;Therapeutic exercise;Ultrasound;Scar mobilization;Coping strategies training;Passive range of motion;Cryotherapy;Electrical Stimulation;Paraffin;Manual Therapy;Patient/family education    Plan A/ROM, self P/ROM    Consulted and Agree with Plan of Care Patient           Patient will benefit from skilled therapeutic intervention in order to improve the following deficits and impairments:   Body Structure / Function / Physical Skills: ADL,Strength,Dexterity,GMC,Pain,Edema,UE functional use,IADL,ROM,Scar mobility,Coordination,Flexibility,Sensation,FMC,Decreased knowledge of precautions       Visit Diagnosis: Stiffness of hand joint, left  Pain in joint of left hand  Muscle weakness (generalized)  Localized edema    Problem List There are no problems to display for this patient.   Aristidis Talerico 01/09/2021, 8:33 AM  [facility] [address] [place], [place], [address] Phone: [phone]   Fax:  [phone]  Name: Howard Newman MRN: [id number] Date of Birth: 1980/12/08

## 2021-01-09 NOTE — Patient Instructions (Signed)
Continue with the exercises issued last visit, You can now perform full active range of motion, not just half.   PROM: Finger MP Joints   Passively bend ___small_____ finger of hand at big knuckle until stretch is felt. Hold _10___ seconds. Relax. Straighten finger as far as possible. Repeat __5-10__ times per set.  Do __4-6__ sessions per day.   PIP Flexion (Passive)   Use other hand to bend the middle joint of _small_____ finger down as far as possible. Hold _10___ seconds. Repeat __5-10__ times. Do _4-6___ sessions per day.    PROM: Finger DIP Joints   Passively bend __small______ finger(s) of  hand at tip joint until stretch is felt. Hold __10__ seconds. Relax. Straighten finger as far as possible. Repeat _5___ times per set.  Do __4-6__ sessions per day.  Copyright  [employer]. All rights reserved.    You can use and ice pack for 8-10 mins after exercise, make sure to wrap in a towel.

## 2021-01-16 ENCOUNTER — Ambulatory Visit: Payer: Self-pay | Admitting: Occupational Therapy

## 2021-01-21 ENCOUNTER — Other Ambulatory Visit: Payer: Self-pay

## 2021-01-21 ENCOUNTER — Ambulatory Visit: Payer: Self-pay | Admitting: Occupational Therapy

## 2021-01-21 DIAGNOSIS — R600 Localized edema: Secondary | ICD-10-CM

## 2021-01-21 DIAGNOSIS — M6281 Muscle weakness (generalized): Secondary | ICD-10-CM

## 2021-01-21 DIAGNOSIS — M25542 Pain in joints of left hand: Secondary | ICD-10-CM

## 2021-01-21 DIAGNOSIS — M25642 Stiffness of left hand, not elsewhere classified: Secondary | ICD-10-CM

## 2021-01-21 NOTE — Patient Instructions (Addendum)
Continue with the exercises we gave you the last 2 visits, continue to exercise 6x day so your hand does not get stiff.  Do not lift anything heavy while the splint is off.  Remove your splint at 3x day for 1 hour, wear your buddy strap while splint is off for protection.   Continue to sleep in your splint and to wear it when not exercising or whenever in public.

## 2021-01-21 NOTE — Therapy (Signed)
[facility] [address] [place], [place], [address] Phone: [phone]   Fax:  [phone]  Occupational Therapy Treatment  Patient Details  Name: Howard Newman MRN: [id number] Date of Birth: 1981-04-29 Referring Provider (OT): Dr. Janee Morn   Encounter Date: 01/21/2021   OT End of Session - 01/21/21 1051    Visit Number 3    Number of Visits 16    Date for OT Re-Evaluation 03/08/21    Authorization Type SELF PAY    OT Start Time 1045    OT Stop Time 1130    OT Time Calculation (min) 45 min           Past Medical History:  Diagnosis Date  . Dental disease   . Environmental allergies     Past Surgical History:  Procedure Laterality Date  . MOUTH SURGERY    . OPEN REDUCTION INTERNAL FIXATION (ORIF) PROXIMAL PHALANX Left 12/29/2020   Procedure: Open treatment of left small finger proximal phalanx fracture;  Surgeon: Mack Hook, MD;  Location: [facility];  Service: Orthopedics;  Laterality: Left;  with preop regional block    There were no vitals filed for this visit.   Subjective Assessment - 01/21/21 1050    Subjective  Pt denies pain    Pertinent History ORIF Lt SF P1 Fx 12/29/20    Limitations no strengthening    Currently in Pain? No/denies                   Treatment: Fluidotherapy x 10 mins to LUE for stiffness, no adverse reactions. A/ROM tendon gliding, PIP blocking , gentle place and hold and passive extension. Buddy strap issued to assist with ROM and to wear when not wearing splint for protection. NMES to finger flexors small muscle group, atrophy, intensity 14 x 10 mins with pt performing A/ROM finger flexion during on cycle, no adverse reactions,  pt demonstrates improved flexion at end of session.             OT Education - 01/21/21 1110    Education Details reveiwed A/ROM exercises and  self P/ROM, use of buddy strap, splint wear schedule     Person(s) Educated Patient    Methods Explanation;Demonstration;Verbal cues;Handout;Tactile cues    Comprehension Verbalized understanding;Returned demonstration;Verbal cues required            OT Short Term Goals - 01/06/21 1250      OT SHORT TERM GOAL #1   Title Independent with splint wear and care    Time 4    Period Weeks    Status On-going      OT SHORT TERM GOAL #2   Title Independent with HEP    Time 4    Period Weeks    Status On-going      OT SHORT TERM GOAL #3   Title Pt to demo no more than 10* PIP extensor lag Lt small finger    Time 4    Period Weeks    Status New      OT SHORT TERM GOAL #4   Title Pt to demo 90% or greater full composite flexion and extension Lt hand    Time 4    Period Weeks    Status New      OT SHORT TERM GOAL #5   Title Pt to verbalize understanding with edema management strategies Lt hand    Time 4    Period Weeks    Status  New             OT Long Term Goals - 01/06/21 1252      OT LONG TERM GOAL #1   Title Pt to verbalize understanding with strengthening HEP for Lt hand    Time 8    Period Weeks    Status New      OT LONG TERM GOAL #2   Title Pt to return to using Lt hand as assist for all ADLS    Time 8    Period Weeks    Status New      OT LONG TERM GOAL #3   Title Grip strength to be 50% or > Lt hand compared to Rt hand    Time 8    Period Weeks    Status New      OT LONG TERM GOAL #4   Title Pain Lt hand to be less than or equal to 3/10 with all functional tasks    Time 8    Period Weeks    Status New                 Plan - 01/21/21 1211    Clinical Impression Statement Pt is progressing towards goals with improving ROM.    OT Occupational Profile and History Problem Focused Assessment - Including review of records relating to presenting problem    Occupational performance deficits (Please refer to evaluation for details): ADL's;Work;Leisure    Body Structure / Function / Physical Skills  ADL;Strength;Dexterity;GMC;Pain;Edema;UE functional use;IADL;ROM;Scar mobility;Coordination;Flexibility;Sensation;FMC;Decreased knowledge of precautions    Rehab Potential Good    Clinical Decision Making Several treatment options, min-mod task modification necessary    Comorbidities Affecting Occupational Performance: None    Modification or Assistance to Complete Evaluation  Min-Moderate modification of tasks or assist with assess necessary to complete eval    OT Frequency 2x / week   however, may come 1x/wk at times due to self pay/financial concerns   OT Duration 8 weeks    OT Treatment/Interventions Self-care/ADL training;Moist Heat;Fluidtherapy;Splinting;DME and/or AE instruction;Contrast Bath;Compression bandaging;Therapeutic activities;Therapeutic exercise;Ultrasound;Scar mobilization;Coping strategies training;Passive range of motion;Cryotherapy;Electrical Stimulation;Paraffin;Manual Therapy;Patient/family education    Plan A/ROM, self P/ROM, fluido, estim    Consulted and Agree with Plan of Care Patient           Patient will benefit from skilled therapeutic intervention in order to improve the following deficits and impairments:   Body Structure / Function / Physical Skills: ADL,Strength,Dexterity,GMC,Pain,Edema,UE functional use,IADL,ROM,Scar mobility,Coordination,Flexibility,Sensation,FMC,Decreased knowledge of precautions       Visit Diagnosis: Stiffness of hand joint, left  Pain in joint of left hand  Muscle weakness (generalized)  Localized edema    Problem List There are no problems to display for this patient.   Corley Kohls 01/21/2021, 12:12 PM  [facility] [address] [place], [place], [address] Phone: [phone]   Fax:  [phone]  Name: Howard Newman MRN: [id number] Date of Birth: [birth year]

## 2021-01-30 ENCOUNTER — Ambulatory Visit: Payer: Self-pay | Admitting: Occupational Therapy

## 2021-01-31 NOTE — Telephone Encounter (Signed)
No note, error

## 2021-02-02 ENCOUNTER — Ambulatory Visit: Payer: Self-pay | Admitting: Occupational Therapy

## 2021-02-09 ENCOUNTER — Ambulatory Visit: Payer: Self-pay | Attending: Orthopedic Surgery | Admitting: Occupational Therapy

## 2021-02-16 ENCOUNTER — Ambulatory Visit: Payer: Self-pay | Admitting: Occupational Therapy

## 2021-02-23 ENCOUNTER — Ambulatory Visit: Payer: Self-pay | Admitting: Occupational Therapy

## 2021-02-26 ENCOUNTER — Other Ambulatory Visit: Payer: Self-pay

## 2021-02-26 ENCOUNTER — Ambulatory Visit (HOSPITAL_COMMUNITY)
Admission: EM | Admit: 2021-02-26 | Discharge: 2021-02-26 | Disposition: A | Discharge status: Home / Self Care | Payer: Self-pay | Attending: Internal Medicine | Admitting: Internal Medicine

## 2021-02-26 ENCOUNTER — Encounter (HOSPITAL_COMMUNITY): Payer: Self-pay | Admitting: Emergency Medicine

## 2021-02-26 DIAGNOSIS — K0889 Other specified disorders of teeth and supporting structures: Secondary | ICD-10-CM

## 2021-02-26 MED ORDER — AMOXICILLIN-POT CLAVULANATE 875-125 MG PO TABS: 1.0000 | ORAL_TABLET | Freq: Two times a day (BID) | ORAL | 0 refills | Status: AC

## 2021-02-26 NOTE — ED Triage Notes (Signed)
dental pain for a week.  Pain on right side of mouth, top, front teeth

## 2021-02-26 NOTE — ED Provider Notes (Signed)
[facility]    CSN: [id number] Arrival date & time: 02/26/21  1457      History   Chief Complaint Chief Complaint  Patient presents with  . Dental Pain    HPI Howard Newman is a 40 y.o. male presents to urgent care today with complaints of dental pain.  Patient reports history of same with multiple tooth extractions.  Pain in right upper gumline where teeth remotely extracted.  Denies any recent fever or chills.     Past Medical History:  Diagnosis Date  . Dental disease   . Environmental allergies     There are no problems to display for this patient.   Past Surgical History:  Procedure Laterality Date  . MOUTH SURGERY    . OPEN REDUCTION INTERNAL FIXATION (ORIF) PROXIMAL PHALANX Left 12/29/2020   Procedure: Open treatment of left small finger proximal phalanx fracture;  Surgeon: Mack Hook, MD;  Location: [facility];  Service: Orthopedics;  Laterality: Left;  with preop regional block       Home Medications    Prior to Admission medications   Medication Sig Start Date End Date Taking? Authorizing Provider  amoxicillin-clavulanate (AUGMENTIN) 875-125 MG tablet Take 1 tablet by mouth 2 (two) times daily for 7 days. 02/26/21 03/05/21 Yes Rolla Etienne, NP  acetaminophen (TYLENOL) 325 MG tablet Take 2 tablets (650 mg total) by mouth every 6 (six) hours. Patient not taking: Reported on 02/26/2021 12/29/20   Mack Hook, MD  ibuprofen (ADVIL) 200 MG tablet Take 3 tablets (600 mg total) by mouth every 6 (six) hours. Patient not taking: Reported on 02/26/2021 12/29/20   Mack Hook, MD  loratadine-pseudoephedrine (CLARITIN-D 24-HOUR) 10-240 MG 24 hr tablet Take 1 tablet by mouth daily as needed for allergies. Patient not taking: Reported on 02/26/2021    [provider]  oxyCODONE (ROXICODONE) 5 MG immediate release tablet Take 1 tablet (5 mg total) by mouth every 6 (six) hours as needed for severe pain (postop pain). Patient  not taking: Reported on 02/26/2021 12/29/20   Mack Hook, MD    Family History Family History  Problem Relation Age of Onset  . Heart failure Mother   . Diabetes Mother   . Cancer Mother   . Healthy Father     Social History Social History   Tobacco Use  . Smoking status: Current Some Day Smoker    Packs/day: 0.00    Types: Cigars  . Smokeless tobacco: Never Used  Vaping Use  . Vaping Use: Never used  Substance Use Topics  . Alcohol use: Yes  . Drug use: Yes    Types: Marijuana     Allergies   Patient has no known allergies.   Review of Systems As stated in HPI otherwise negative   Physical Exam Triage Vital Signs ED Triage Vitals  Enc Vitals Group     BP 02/26/21 1557 (!) 146/102     Pulse Rate 02/26/21 1557 80     Resp 02/26/21 1557 18     Temp --      Temp Source 02/26/21 1557 Oral     SpO2 02/26/21 1557 100 %     Weight --      Height --      Head Circumference --      Peak Flow --      Pain Score 02/26/21 1554 9     Pain Loc --      Pain Edu? --  Excl. in GC? --    No data found.  Updated Vital Signs BP (!) 146/102 (BP Location: Right Arm)   Pulse 80   Resp 18   SpO2 100%   Visual Acuity Right Eye Distance:   Left Eye Distance:   Bilateral Distance:    Right Eye Near:   Left Eye Near:    Bilateral Near:     Physical Exam Constitutional:      General: He is not in acute distress.    Appearance: Normal appearance. He is not ill-appearing or toxic-appearing.  HENT:     Mouth/Throat:     Mouth: Mucous membranes are moist.     Pharynx: Posterior oropharyngeal erythema present. No oropharyngeal exudate.     Comments: Significant erythema to right upper gumline.  No obvious abscess.  Teeth previously extracted. Musculoskeletal:     Cervical back: Normal range of motion and neck supple.  Lymphadenopathy:     Cervical: No cervical adenopathy.  Neurological:     Mental Status: He is alert.  Psychiatric:        Mood and Affect:  Mood normal.        Behavior: Behavior normal.      UC Treatments / Results  Labs (all labs ordered are listed, but only abnormal results are displayed) Labs Reviewed - No data to display  EKG   Radiology No results found.  Procedures Procedures (including critical care time)  Medications Ordered in UC Medications - No data to display  Initial Impression / Assessment and Plan / UC Course  I have reviewed the triage vital signs and the nursing notes.  Pertinent labs & imaging results that were available during my care of the patient were reviewed by me and considered in my medical decision making (see chart for details).  Dental pain -Exam consistent with acute gingivitis.  However, cannot rule out developing abscess -Augmentin twice daily x7 days -Follow-up precautions discussed.  Dental referral given.  Reviewed expections re: course of current medical issues. Questions answered. Outlined signs and symptoms indicating need for more acute intervention. Pt verbalized understanding. AVS given   Final Clinical Impressions(s) / UC Diagnoses   Final diagnoses:  Pain, dental     Discharge Instructions     Take antibiotics as prescribed.  Please follow-up or seek care for any worsening pain or symptoms.  I have given you a referral to a dentist for follow-up.  Please call to make an appointment    ED Prescriptions    Medication Sig Dispense Auth. Provider   amoxicillin-clavulanate (AUGMENTIN) 875-125 MG tablet Take 1 tablet by mouth 2 (two) times daily for 7 days. 14 tablet Rolla Etienne, NP     PDMP not reviewed this encounter.   Rolla Etienne, NP 02/26/21 [phone]

## 2021-02-26 NOTE — Discharge Instructions (Addendum)
Take antibiotics as prescribed.  Please follow-up or seek care for any worsening pain or symptoms.  I have given you a referral to a dentist for follow-up.  Please call to make an appointment

## 2021-03-09 ENCOUNTER — Emergency Department (HOSPITAL_COMMUNITY)
Admission: EM | Admit: 2021-03-09 | Discharge: 2021-03-09 | Disposition: A | Discharge status: Home / Self Care | Payer: Self-pay | Attending: Physician Assistant | Admitting: Physician Assistant

## 2021-03-09 ENCOUNTER — Other Ambulatory Visit: Payer: Self-pay

## 2021-03-09 DIAGNOSIS — K0889 Other specified disorders of teeth and supporting structures: Secondary | ICD-10-CM | POA: Insufficient documentation

## 2021-03-09 DIAGNOSIS — F17290 Nicotine dependence, other tobacco product, uncomplicated: Secondary | ICD-10-CM | POA: Insufficient documentation

## 2021-03-09 MED ORDER — PENICILLIN V POTASSIUM 500 MG PO TABS: 500.0000 mg | ORAL_TABLET | Freq: Four times a day (QID) | ORAL | 0 refills | Status: AC

## 2021-03-09 MED ORDER — PENICILLIN V POTASSIUM 500 MG PO TABS: 500.0000 mg | ORAL_TABLET | Freq: Four times a day (QID) | ORAL | 0 refills | Status: DC

## 2021-03-09 NOTE — ED Provider Notes (Signed)
[facility] EMERGENCY DEPARTMENT Provider Note   CSN: [id number] Arrival date & time: 03/09/21  1549     History No chief complaint on file.   Howard Newman is a 40 y.o. male.  HPI   40 y/o male presents for eval of dental pain ongoing for several weeks. Reports pain to the lower front gums and to the right font gums. Denies fevers. Is trying to work on seeing a Education officer, community  Past Medical History:  Diagnosis Date  . Dental disease   . Environmental allergies     There are no problems to display for this patient.   Past Surgical History:  Procedure Laterality Date  . MOUTH SURGERY    . OPEN REDUCTION INTERNAL FIXATION (ORIF) PROXIMAL PHALANX Left 12/29/2020   Procedure: Open treatment of left small finger proximal phalanx fracture;  Surgeon: Mack Hook, MD;  [facility];  Service: Orthopedics;  Laterality: Left;  with preop regional block       Family History  Problem Relation Age of Onset  . Heart failure Mother   . Diabetes Mother   . Cancer Mother   . Healthy Father     Social History   Tobacco Use  . Smoking status: Current Some Day Smoker    Packs/day: 0.00    Types: Cigars  . Smokeless tobacco: Never Used  Vaping Use  . Vaping Use: Never used  Substance Use Topics  . Alcohol use: Yes  . Drug use: Yes    Types: Marijuana    Home Medications Prior to Admission medications   Medication Sig Start Date End Date Taking? Authorizing Provider  acetaminophen (TYLENOL) 325 MG tablet Take 2 tablets (650 mg total) by mouth every 6 (six) hours. Patient not taking: Reported on 02/26/2021 12/29/20   Mack Hook, MD  ibuprofen (ADVIL) 200 MG tablet Take 3 tablets (600 mg total) by mouth every 6 (six) hours. Patient not taking: Reported on 02/26/2021 12/29/20   Mack Hook, MD  loratadine-pseudoephedrine (CLARITIN-D 24-HOUR) 10-240 MG 24 hr tablet Take 1 tablet by mouth daily as needed for allergies. Patient not  taking: Reported on 02/26/2021    [provider]  oxyCODONE (ROXICODONE) 5 MG immediate release tablet Take 1 tablet (5 mg total) by mouth every 6 (six) hours as needed for severe pain (postop pain). Patient not taking: Reported on 02/26/2021 12/29/20   Mack Hook, MD  penicillin v potassium (VEETID) 500 MG tablet Take 1 tablet (500 mg total) by mouth 4 (four) times daily for 7 days. 03/09/21 03/16/21  Leeba Barbe S, PA-C    Allergies    Patient has no known allergies.  Review of Systems   Review of Systems  Constitutional: Negative for fever.  HENT: Positive for dental problem.     Physical Exam Updated Vital Signs BP (!) 138/94 (BP Location: Left Arm)   Pulse 86   Temp 98.1 F (36.7 C) (Oral)   Resp 18   SpO2 100%   Physical Exam Constitutional:      General: He is not in acute distress.    Appearance: He is well-developed.  HENT:     Mouth/Throat:     Comments: Multiple missing teeth, there is ttp along the lower gum line and right upper gumline. There is no discrete abscess. No trismus noted. Eyes:     Conjunctiva/sclera: Conjunctivae normal.  Cardiovascular:     Rate and Rhythm: Normal rate and regular rhythm.  Pulmonary:  Effort: Pulmonary effort is normal.     Breath sounds: Normal breath sounds.  Skin:    General: Skin is warm and dry.  Neurological:     Mental Status: He is alert and oriented to person, place, and time.     ED Results / Procedures / Treatments   Labs (all labs ordered are listed, but only abnormal results are displayed) Labs Reviewed - No data to display  EKG None  Radiology No results found.  Procedures Procedures   Medications Ordered in ED Medications - No data to display  ED Course  I have reviewed the triage vital signs and the nursing notes.  Pertinent labs & imaging results that were available during my care of the patient were reviewed by me and considered in my medical decision making (see chart for  details).    MDM Rules/Calculators/A&P                          Patient with toothache.  No gross abscess.  Exam unconcerning for Ludwig's angina or spread of infection.  Will treat with penicillin and pain medicine.  Urged patient to follow-up with dentist.     Final Clinical Impression(s) / ED Diagnoses Final diagnoses:  Pain, dental    Rx / DC Orders ED Discharge Orders         Ordered    penicillin v potassium (VEETID) 500 MG tablet  4 times daily,   Status:  Discontinued        03/09/21 1613    penicillin v potassium (VEETID) 500 MG tablet  4 times daily        03/09/21 1613           Karrie Meres, PA-C 03/09/21 1615    Tegeler, Canary Brim, MD 03/09/21 [phone]

## 2021-03-09 NOTE — Discharge Instructions (Signed)
You were given a prescription for antibiotics. Please take the antibiotic prescription fully.   Please follow-up with a dentist in the next 5 to 7 days for reevaluation.  If you do not have a dentist, resources were provided for dentist in the area in your discharge summary.  Please contact one of the offices that are listed and make an appointment for follow-up.  Please return to the emergency department for any new or worsening symptoms.  

## 2021-03-17 ENCOUNTER — Ambulatory Visit
Admission: EM | Admit: 2021-03-17 | Discharge: 2021-03-17 | Disposition: A | Discharge status: Home / Self Care | Payer: Self-pay | Attending: Emergency Medicine | Admitting: Emergency Medicine

## 2021-03-17 DIAGNOSIS — Z113 Encounter for screening for infections with a predominantly sexual mode of transmission: Secondary | ICD-10-CM | POA: Insufficient documentation

## 2021-03-17 DIAGNOSIS — R519 Headache, unspecified: Secondary | ICD-10-CM | POA: Insufficient documentation

## 2021-03-17 MED ORDER — NAPROXEN 500 MG PO TABS: 500.0000 mg | ORAL_TABLET | Freq: Two times a day (BID) | ORAL | 0 refills | Status: DC

## 2021-03-17 NOTE — ED Provider Notes (Signed)
[name]    CSN: [id number] Arrival date & time: 03/17/21  1438      History   Chief Complaint Chief Complaint  Patient presents with  . Headache  . std testing request    HPI Howard Newman is a 40 y.o. male presenting today for evaluation of headache and STD screening.  Reports that over the past 4 days he has had headache on right side of his head radiating into occipital area.  Denies associated vision changes, light sensitivity nausea or vomiting.  Denies associated URI symptoms.  Reports remote history of headaches, but not of recently.  Reports headache persistent through the day, but more bothersome at bedtime.  Using Tylenol without relief.  Denies dizziness, lightheadedness, weakness or paresthesias.  Also requesting STD screening.  Reports that he had intercourse where the condom broke and is concerned about STDs.  Reports brief episodes of clear discharge but this has not been persistent.  Denies dysuria, rashes or lesions.  Denies any testicle pain or swelling.  HPI  Past Medical History:  Diagnosis Date  . Dental disease   . Environmental allergies     There are no problems to display for this patient.   Past Surgical History:  Procedure Laterality Date  . MOUTH SURGERY    . OPEN REDUCTION INTERNAL FIXATION (ORIF) PROXIMAL PHALANX Left 12/29/2020   Procedure: Open treatment of left small finger proximal phalanx fracture;  Surgeon: Mack Hook, MD;  Location:  [facility];  Service: Orthopedics;  Laterality: Left;  with preop regional block       Home Medications    Prior to Admission medications   Medication Sig Start Date End Date Taking? Authorizing Provider  naproxen (NAPROSYN) 500 MG tablet Take 1 tablet (500 mg total) by mouth 2 (two) times daily. 03/17/21  Yes Eleora Sutherland, Junius Creamer, PA-C    Family History Family History  Problem Relation Age of Onset  . Heart failure Mother   . Diabetes Mother   . Cancer Mother    . Healthy Father     Social History Social History   Tobacco Use  . Smoking status: Current Some Day Smoker    Packs/day: 0.00    Types: Cigars  . Smokeless tobacco: Never Used  Vaping Use  . Vaping Use: Never used  Substance Use Topics  . Alcohol use: Yes  . Drug use: Yes    Types: Marijuana     Allergies   Patient has no known allergies.   Review of Systems Review of Systems  Constitutional: Negative for fatigue and fever.  HENT: Negative for congestion, sinus pressure and sore throat.   Eyes: Negative for photophobia, pain and visual disturbance.  Respiratory: Negative for cough and shortness of breath.   Cardiovascular: Negative for chest pain.  Gastrointestinal: Negative for abdominal pain, nausea and vomiting.  Genitourinary: Negative for decreased urine volume and hematuria.  Musculoskeletal: Negative for myalgias, neck pain and neck stiffness.  Neurological: Positive for headaches. Negative for dizziness, syncope, facial asymmetry, speech difficulty, weakness, light-headedness and numbness.     Physical Exam Triage Vital Signs ED Triage Vitals [03/17/21 1645]  Enc Vitals Group     BP (!) 147/98     Pulse Rate 82     Resp 18     Temp 98.4 F (36.9 C)     Temp Source Oral     SpO2 97 %     Weight      Height  Head Circumference      Peak Flow      Pain Score 5     Pain Loc      Pain Edu?      Excl. in GC?    No data found.  Updated Vital Signs BP (!) 147/98 (BP Location: Left Arm)   Pulse 82   Temp 98.4 F (36.9 C) (Oral)   Resp 18   SpO2 97%   Visual Acuity Right Eye Distance:   Left Eye Distance:   Bilateral Distance:    Right Eye Near:   Left Eye Near:    Bilateral Near:     Physical Exam Vitals and nursing note reviewed.  Constitutional:      Appearance: He is well-developed.     Comments: No acute distress  HENT:     Head: Normocephalic and atraumatic.     Ears:     Comments: Bilateral ears without tenderness to  palpation of external auricle, tragus and mastoid, EAC's without erythema or swelling, TM's with good bony landmarks and cone of light. Non erythematous.     Nose: Nose normal.     Mouth/Throat:     Comments: Oral mucosa pink and moist, no tonsillar enlargement or exudate. Posterior pharynx patent and nonerythematous, no uvula deviation or swelling. Normal phonation Eyes:     Conjunctiva/sclera: Conjunctivae normal.  Cardiovascular:     Rate and Rhythm: Normal rate and regular rhythm.  Pulmonary:     Effort: Pulmonary effort is normal. No respiratory distress.     Comments: Breathing comfortably at rest, CTABL, no wheezing, rales or other adventitious sounds auscultated Abdominal:     General: There is no distension.  Musculoskeletal:        General: Normal range of motion.     Cervical back: Neck supple.  Skin:    General: Skin is warm and dry.  Neurological:     General: No focal deficit present.     Mental Status: He is alert and oriented to person, place, and time. Mental status is at baseline.     Cranial Nerves: No cranial nerve deficit.     Motor: No weakness.     Gait: Gait normal.      UC Treatments / Results  Labs (all labs ordered are listed, but only abnormal results are displayed) Labs Reviewed  CYTOLOGY, (ORAL, ANAL, URETHRAL) ANCILLARY ONLY    EKG   Radiology No results found.  Procedures Procedures (including critical care time)  Medications Ordered in UC Medications - No data to display  Initial Impression / Assessment and Plan / UC Course  I have reviewed the triage vital signs and the nursing notes.  Pertinent labs & imaging results that were available during my care of the patient were reviewed by me and considered in my medical decision making (see chart for details).     1.  Headache-no neurodeficits, offered Toradol, patient declined, will proceed with Naprosyn orally for headaches and drink plenty of fluids.  2.  Screen for STD-urethral  swab pending, will call with results and provide treatment as needed  Discussed strict return precautions. Patient verbalized understanding and is agreeable with plan.  Final Clinical Impressions(s) / UC Diagnoses   Final diagnoses:  Screen for STD (sexually transmitted disease)  Acute nonintractable headache, unspecified headache type     Discharge Instructions     Please use Naprosyn twice daily as needed for headaches Drink plenty of fluids Swab pending to screen for STDs Follow-up if  any symptoms not improving or worsening    ED Prescriptions    Medication Sig Dispense Auth. Provider   naproxen (NAPROSYN) 500 MG tablet Take 1 tablet (500 mg total) by mouth 2 (two) times daily. 30 tablet Yuri Flener, [place] C, PA-C     PDMP not reviewed this encounter.   Lew Dawes, [place] 03/17/21 1844

## 2021-03-17 NOTE — ED Triage Notes (Signed)
Four day h/o right sided HA. Has been taking Tylenol without relief. HA interrupted his sleep. Denies photophobia.  Pt is also requesting STD testing. One week ago, Pt reports one instance of clear discharge. Denies hematuria and dysuria.

## 2021-03-17 NOTE — Discharge Instructions (Signed)
Please use Naprosyn twice daily as needed for headaches Drink plenty of fluids Swab pending to screen for STDs Follow-up if any symptoms not improving or worsening

## 2021-03-18 LAB — CYTOLOGY, (ORAL, ANAL, URETHRAL) ANCILLARY ONLY
Chlamydia: NEGATIVE
Comment: NEGATIVE
Comment: NEGATIVE
Comment: NORMAL
Neisseria Gonorrhea: NEGATIVE
Trichomonas: NEGATIVE

## 2021-05-02 ENCOUNTER — Encounter (HOSPITAL_COMMUNITY): Payer: Self-pay | Admitting: *Deleted

## 2021-05-02 ENCOUNTER — Ambulatory Visit (HOSPITAL_COMMUNITY)
Admission: EM | Admit: 2021-05-02 | Discharge: 2021-05-02 | Disposition: A | Discharge status: Home / Self Care | Payer: Self-pay | Attending: Urgent Care | Admitting: Urgent Care

## 2021-05-02 ENCOUNTER — Other Ambulatory Visit: Payer: Self-pay

## 2021-05-02 DIAGNOSIS — K120 Recurrent oral aphthae: Secondary | ICD-10-CM

## 2021-05-02 DIAGNOSIS — B353 Tinea pedis: Secondary | ICD-10-CM

## 2021-05-02 MED ORDER — TRIAMCINOLONE ACETONIDE 0.1 % MT PSTE: 1.0000 "application " | PASTE | Freq: Two times a day (BID) | OROMUCOSAL | 0 refills | Status: DC

## 2021-05-02 MED ORDER — CHLORHEXIDINE GLUCONATE 0.12 % MT SOLN: OROMUCOSAL | 0 refills | Status: DC

## 2021-05-02 MED ORDER — CLOTRIMAZOLE-BETAMETHASONE 1-0.05 % EX CREA: TOPICAL_CREAM | CUTANEOUS | 0 refills | Status: DC

## 2021-05-02 NOTE — ED Triage Notes (Signed)
Pt reports gum swelling and fungal infection to feet.

## 2021-05-02 NOTE — ED Provider Notes (Signed)
[facility]   MRN: [id number] DOB: 1980-12-13  Subjective:   Howard Newman is a 40 y.o. male presenting for 2 chief complaints.  Patient reports sores on his gums and would like an antibiotic for this.  He has had all his teeth extracted.  Does not get regular follow-up with his oral surgeon.  He also has a recurrence of a fungal infection of the left foot.  States that there is itching and sometimes stinging, rash over the same area where he was previously seen at our clinic for.  Reports that he typically responds well to the cream that is prescribed by a ran out of it after he used it the last time he was seen.  Denies fever, chest pain, shortness of breath, drainage of pus or bleeding from the mouth.  No current facility-administered medications for this encounter.  Current Outpatient Medications:    naproxen (NAPROSYN) 500 MG tablet, Take 1 tablet (500 mg total) by mouth 2 (two) times daily., Disp: 30 tablet, Rfl: 0   No Known Allergies  Past Medical History:  Diagnosis Date   Dental disease    Environmental allergies      Past Surgical History:  Procedure Laterality Date   MOUTH SURGERY     OPEN REDUCTION INTERNAL FIXATION (ORIF) PROXIMAL PHALANX Left 12/29/2020   Procedure: Open treatment of left small finger proximal phalanx fracture;  Surgeon: Mack Hook, MD;  Location: [facility];  Service: Orthopedics;  Laterality: Left;  with preop regional block    Family History  Problem Relation Age of Onset   Heart failure Mother    Diabetes Mother    Cancer Mother    Healthy Father     Social History   Tobacco Use   Smoking status: Some Days    Packs/day: 0.00    Types: Cigars, Cigarettes   Smokeless tobacco: Never  Vaping Use   Vaping Use: Never used  Substance Use Topics   Alcohol use: Yes   Drug use: Yes    Types: Marijuana    ROS   Objective:   Vitals: BP 118/83   Pulse 73   Temp 98.7 F (37.1 C)   Resp 16    SpO2 100%   Physical Exam Constitutional:      General: He is not in acute distress.    Appearance: Normal appearance. He is well-developed and normal weight. He is not ill-appearing, toxic-appearing or diaphoretic.  HENT:     Head: Normocephalic and atraumatic.     Right Ear: External ear normal.     Left Ear: External ear normal.     Nose: Nose normal.     Mouth/Throat:     Pharynx: Oropharynx is clear.     Comments: Aphthous ulcer noted over the upper mid to right side of his gumline, right-sided center buccal surface.  No facial swelling, tenderness, trismus, difficulty controlling secretions. Eyes:     General: No scleral icterus.       Right eye: No discharge.        Left eye: No discharge.     Extraocular Movements: Extraocular movements intact.     Pupils: Pupils are equal, round, and reactive to light.  Cardiovascular:     Rate and Rhythm: Normal rate.  Pulmonary:     Effort: Pulmonary effort is normal.  Musculoskeletal:     Cervical back: Normal range of motion.       Feet:  Neurological:  Mental Status: He is alert and oriented to person, place, and time.  Psychiatric:        Mood and Affect: Mood normal.        Behavior: Behavior normal.        Thought Content: Thought content normal.        Judgment: Judgment normal.      Assessment and Plan :   PDMP not reviewed this encounter.  1. Tinea pedis of left foot   2. Aphthous ulcer     Recommended clotrimazole betamethasone cream for tinea pedis, follow-up with podiatry.  Use triamcinolone paste for aphthous ulcer, chlorhexidine rinse for his oral hygiene.  Follow-up with his oral surgeon. Counseled patient on potential for adverse effects with medications prescribed/recommended today, ER and return-to-clinic precautions discussed, patient verbalized understanding.    Wallis Bamberg, PA-C 05/02/21 1233

## 2021-05-02 NOTE — Discharge Instructions (Addendum)
[facility] ([place] Podiatrist in [place], [place] COVID-19 info: [email or web address] Get online care: [email or web address] Address: [address], [place], [place] [address] Phone: [phone] Appointments: [email or web address]   [facility] [place] in [place], [place] Address: [address], [place], [place] [address] Phone: [phone]  

## 2021-07-23 ENCOUNTER — Other Ambulatory Visit: Payer: Self-pay

## 2021-07-23 ENCOUNTER — Emergency Department (HOSPITAL_COMMUNITY)
Admission: EM | Admit: 2021-07-23 | Discharge: 2021-07-23 | Disposition: A | Discharge status: Home / Self Care | Payer: Self-pay | Attending: Emergency Medicine | Admitting: Emergency Medicine

## 2021-07-23 ENCOUNTER — Encounter (HOSPITAL_COMMUNITY): Payer: Self-pay | Admitting: Emergency Medicine

## 2021-07-23 DIAGNOSIS — F17210 Nicotine dependence, cigarettes, uncomplicated: Secondary | ICD-10-CM | POA: Insufficient documentation

## 2021-07-23 DIAGNOSIS — M62830 Muscle spasm of back: Secondary | ICD-10-CM | POA: Insufficient documentation

## 2021-07-23 MED ORDER — METHOCARBAMOL 500 MG PO TABS: 500.0000 mg | ORAL_TABLET | Freq: Two times a day (BID) | ORAL | 0 refills | Status: DC

## 2021-07-23 NOTE — Discharge Instructions (Addendum)
Take the prescribed medication as directed.  Make sure to hydrate.  Can use heat therapy to help as well. Follow-up with your doctor. Return to the ED for new or worsening symptoms.

## 2021-07-23 NOTE — ED Triage Notes (Signed)
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

## 2021-07-23 NOTE — ED Provider Notes (Signed)
[facility] EMERGENCY DEPARTMENT Provider Note   CSN: [id number] Arrival date & time: 07/23/21  0403     History Chief Complaint  Patient presents with   Back Pain    Howard Newman is a 40 y.o. male.  The history is provided by the patient and medical records.  Back Pain  40 year old male presenting to the ED with upper back pain.  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  States recently started to have some cramping and tension in his upper back, worse with lifting his arms above his head.  He did try taking some Motrin and using heating pad with some transient relief but after working again yesterday back is hurting more.  He denies any numbness or tingling of his arms or legs.  Past Medical History:  Diagnosis Date   Dental disease    Environmental allergies     There are no problems to display for this patient.   Past Surgical History:  Procedure Laterality Date   MOUTH SURGERY     OPEN REDUCTION INTERNAL FIXATION (ORIF) PROXIMAL PHALANX Left 12/29/2020   Procedure: Open treatment of left small finger proximal phalanx fracture;  Surgeon: Mack Hook, MD;  Location:  SURGERY CENTER;  Service: Orthopedics;  Laterality: Left;  with preop regional block       Family History  Problem Relation Age of Onset   Heart failure Mother    Diabetes Mother    Cancer Mother    Healthy Father     Social History   Tobacco Use   Smoking status: Some Days    Packs/day: 0.00    Types: Cigars, Cigarettes   Smokeless tobacco: Never  Vaping Use   Vaping Use: Never used  Substance Use Topics   Alcohol use: Yes   Drug use: Yes    Types: Marijuana    Home Medications Prior to Admission medications   Medication Sig Start Date End Date Taking? Authorizing Provider  methocarbamol (ROBAXIN) 500 MG tablet Take 1 tablet (500 mg total) by mouth 2 (two) times daily. 07/23/21  Yes Garlon Hatchet, PA-C  chlorhexidine (PERIDEX) 0.12 % solution Rinse mouth with 10mL for 30 seconds at bedtime. 05/02/21   Wallis Bamberg, PA-C  clotrimazole-betamethasone (LOTRISONE) cream Apply to affected area 2 times daily prn 05/02/21   Wallis Bamberg, PA-C  naproxen (NAPROSYN) 500 MG tablet Take 1 tablet (500 mg total) by mouth 2 (two) times daily. 03/17/21   Wieters, Hallie C, PA-C  triamcinolone (KENALOG) 0.1 % paste Use as directed 1 application in the mouth or throat 2 (two) times daily. 05/02/21   Wallis Bamberg, PA-C    Allergies    Patient has no known allergies.  Review of Systems   Review of Systems  Musculoskeletal:  Positive for back pain.  All other systems reviewed and are negative.  Physical Exam Updated Vital Signs BP 128/82 (BP Location: Left Arm)   Pulse 87   Temp 98.5 F (36.9 C)   Resp 15   SpO2 97%   Physical Exam Vitals and nursing note reviewed.  Constitutional:      Appearance: He is well-developed.  HENT:     Head: Normocephalic and atraumatic.  Eyes:     Conjunctiva/sclera: Conjunctivae normal.     Pupils: Pupils are equal, round, and reactive to light.  Cardiovascular:     Rate and Rhythm: Normal rate and regular rhythm.     Heart  sounds: Normal heart sounds.  Pulmonary:     Effort: Pulmonary effort is normal.     Breath sounds: Normal breath sounds.  Abdominal:     General: Bowel sounds are normal.     Palpations: Abdomen is soft.  Musculoskeletal:        General: Normal range of motion.     Cervical back: Normal range of motion.     Comments: No midline spinal deformities, some muscular tenderness along traps bilaterally, spasm noted  Skin:    General: Skin is warm and dry.  Neurological:     Mental Status: He is alert and oriented to person, place, and time.    ED Results / Procedures / Treatments   Labs (all labs ordered are listed, but only abnormal results are displayed) Labs Reviewed - No data to display  EKG None  Radiology No results  found.  Procedures Procedures   Medications Ordered in ED Medications - No data to display  ED Course  I have reviewed the triage vital signs and the nursing notes.  Pertinent labs & imaging results that were available during my care of the patient were reviewed by me and considered in my medical decision making (see chart for details).    MDM Rules/Calculators/A&P                           40 year old male here with upper back pain and tension.  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  Does have some tension and spasm along the traps bilaterally.  He has no midline spinal tenderness or deformities.  No focal neurologic deficits.  I suspect this is likely muscular strain.  We will have him use muscle relaxant, heat, rest.  Work note given.  Can follow-up with PCP.  Return here for new concerns.  Final Clinical Impression(s) / ED Diagnoses Final diagnoses:  Muscle spasm of back    Rx / DC Orders ED Discharge Orders          Ordered    methocarbamol (ROBAXIN) 500 MG tablet  2 times daily        07/23/21 0418             Garlon Hatchet, PA-C 07/23/21 0422    Horton, Clabe Seal, DO 07/24/21 [phone]

## 2021-09-12 ENCOUNTER — Other Ambulatory Visit: Payer: Self-pay

## 2021-09-12 ENCOUNTER — Ambulatory Visit (HOSPITAL_COMMUNITY)
Admission: RE | Admit: 2021-09-12 | Discharge: 2021-09-12 | Disposition: A | Discharge status: Home / Self Care | Payer: Self-pay | Source: Ambulatory Visit | Attending: Family Medicine | Admitting: Family Medicine

## 2021-09-12 ENCOUNTER — Encounter (HOSPITAL_COMMUNITY): Payer: Self-pay

## 2021-09-12 VITALS — BP 104/67 | HR 70 | Temp 97.7°F | Resp 16

## 2021-09-12 DIAGNOSIS — K047 Periapical abscess without sinus: Secondary | ICD-10-CM

## 2021-09-12 DIAGNOSIS — B353 Tinea pedis: Secondary | ICD-10-CM

## 2021-09-12 MED ORDER — AMOXICILLIN-POT CLAVULANATE 875-125 MG PO TABS: 1.0000 | ORAL_TABLET | Freq: Two times a day (BID) | ORAL | 0 refills | Status: DC

## 2021-09-12 MED ORDER — CLOTRIMAZOLE-BETAMETHASONE 1-0.05 % EX CREA: TOPICAL_CREAM | CUTANEOUS | 2 refills | Status: DC

## 2021-09-12 MED ORDER — NAPROXEN 500 MG PO TABS: 500.0000 mg | ORAL_TABLET | Freq: Two times a day (BID) | ORAL | 0 refills | Status: DC | PRN

## 2021-09-12 MED ORDER — CHLORHEXIDINE GLUCONATE 0.12 % MT SOLN: 15.0000 mL | Freq: Two times a day (BID) | OROMUCOSAL | 0 refills | Status: DC

## 2021-09-12 MED ORDER — LIDOCAINE VISCOUS HCL 2 % MT SOLN: 5.0000 mL | OROMUCOSAL | 0 refills | Status: DC | PRN

## 2021-09-12 NOTE — ED Triage Notes (Signed)
Pt reports dental pain x 1 week.  °

## 2021-09-12 NOTE — ED Provider Notes (Signed)
[facility]    CSN: [id number] Arrival date & time: 09/12/21  1112      History   Chief Complaint Chief Complaint  Patient presents with   Appointment    1100   Dental Pain    HPI Howard Newman is a 40 y.o. male.   Patient presenting today with 1 week history of left lower posterior molar dental pain, swelling, redness.  States he has chronic dental issues and has had many of his teeth extracted already but this area has yet to be worked on.  He denies fever, chills, dysphagia, drainage from the area.  Has been using over-the-counter pain relievers and Listerine mouthwash with minimal relief.  He is also having ongoing chronic issues with tinea pedis, itching, rashes and states that Lotrisone helped but he only got a small tube last time.  Requesting a refill on this.   Past Medical History:  Diagnosis Date   Dental disease    Environmental allergies     There are no problems to display for this patient.   Past Surgical History:  Procedure Laterality Date   MOUTH SURGERY     OPEN REDUCTION INTERNAL FIXATION (ORIF) PROXIMAL PHALANX Left 12/29/2020   Procedure: Open treatment of left small finger proximal phalanx fracture;  Surgeon: Mack Hook, MD;  Location: [facility];  Service: Orthopedics;  Laterality: Left;  with preop regional block       Home Medications    Prior to Admission medications   Medication Sig Start Date End Date Taking? Authorizing Provider  amoxicillin-clavulanate (AUGMENTIN) 875-125 MG tablet Take 1 tablet by mouth every 12 (twelve) hours. 09/12/21  Yes Particia Nearing, PA-C  chlorhexidine (PERIDEX) 0.12 % solution Use as directed 15 mLs in the mouth or throat 2 (two) times daily. 09/12/21  Yes Particia Nearing, PA-C  lidocaine (XYLOCAINE) 2 % solution Use as directed 5 mLs in the mouth or throat as needed for mouth pain. 09/12/21  Yes Particia Nearing, PA-C  naproxen (NAPROSYN) 500 MG tablet Take 1  tablet (500 mg total) by mouth 2 (two) times daily as needed. 09/12/21  Yes Particia Nearing, PA-C  chlorhexidine (PERIDEX) 0.12 % solution Rinse mouth with 47mL for 30 seconds at bedtime. 05/02/21   Wallis Bamberg, PA-C  clotrimazole-betamethasone (LOTRISONE) cream Apply to affected area 2 times daily prn 09/12/21   Particia Nearing, PA-C  methocarbamol (ROBAXIN) 500 MG tablet Take 1 tablet (500 mg total) by mouth 2 (two) times daily. 07/23/21   Garlon Hatchet, PA-C  naproxen (NAPROSYN) 500 MG tablet Take 1 tablet (500 mg total) by mouth 2 (two) times daily. 03/17/21   Wieters, Hallie C, PA-C  triamcinolone (KENALOG) 0.1 % paste Use as directed 1 application in the mouth or throat 2 (two) times daily. 05/02/21   Wallis Bamberg, PA-C    Family History Family History  Problem Relation Age of Onset   Heart failure Mother    Diabetes Mother    Cancer Mother    Healthy Father     Social History Social History   Tobacco Use   Smoking status: Some Days    Packs/day: 0.00    Types: Cigars, Cigarettes   Smokeless tobacco: Never  Vaping Use   Vaping Use: Never used  Substance Use Topics   Alcohol use: Yes   Drug use: Yes    Types: Marijuana     Allergies   Patient has no known allergies.   Review  of Systems Review of Systems Per HPI  Physical Exam Triage Vital Signs ED Triage Vitals  Enc Vitals Group     BP 09/12/21 1132 104/67     Pulse Rate 09/12/21 1132 70     Resp 09/12/21 1132 16     Temp 09/12/21 1132 97.7 F (36.5 C)     Temp Source 09/12/21 1132 Oral     SpO2 09/12/21 1132 95 %     Weight --      Height --      Head Circumference --      Peak Flow --      Pain Score 09/12/21 1131 10     Pain Loc --      Pain Edu? --      Excl. in GC? --    No data found.  Updated Vital Signs BP 104/67 (BP Location: Right Arm)   Pulse 70   Temp 97.7 F (36.5 C) (Oral)   Resp 16   SpO2 95%   Visual Acuity Right Eye Distance:   Left Eye Distance:   Bilateral  Distance:    Right Eye Near:   Left Eye Near:    Bilateral Near:     Physical Exam Vitals and nursing note reviewed.  Constitutional:      Appearance: Normal appearance.  HENT:     Head: Atraumatic.     Mouth/Throat:     Mouth: Mucous membranes are moist.     Pharynx: Posterior oropharyngeal erythema present.     Comments: Poor dentition, area of gingival erythema, edema left lower posterior jaw. Eyes:     Extraocular Movements: Extraocular movements intact.     Conjunctiva/sclera: Conjunctivae normal.  Cardiovascular:     Rate and Rhythm: Normal rate and regular rhythm.  Pulmonary:     Effort: Pulmonary effort is normal.     Breath sounds: Normal breath sounds.  Musculoskeletal:        General: Normal range of motion.     Cervical back: Normal range of motion and neck supple.  Skin:    General: Skin is warm and dry.     Findings: Rash present.     Comments: Peeling, hyperpigmentation sporadically across bilateral feet  Neurological:     General: No focal deficit present.     Mental Status: He is oriented to person, place, and time.  Psychiatric:        Mood and Affect: Mood normal.        Thought Content: Thought content normal.        Judgment: Judgment normal.     UC Treatments / Results  Labs (all labs ordered are listed, but only abnormal results are displayed) Labs Reviewed - No data to display  EKG   Radiology No results found.  Procedures Procedures (including critical care time)  Medications Ordered in UC Medications - No data to display  Initial Impression / Assessment and Plan / UC Course  I have reviewed the triage vital signs and the nursing notes.  Pertinent labs & imaging results that were available during my care of the patient were reviewed by me and considered in my medical decision making (see chart for details).     We will send Augmentin, Peridex and viscous lidocaine for dental infection, naproxen as needed for pain relief.  We  will send a larger tube of Lotrisone with refills for his ongoing tinea pedis.  Discussed preventative measures at home for this.  Return for worsening symptoms.  Final Clinical Impressions(s) / UC Diagnoses   Final diagnoses:  Dental infection  Tinea pedis of both feet   Discharge Instructions   None    ED Prescriptions     Medication Sig Dispense Auth. Provider   clotrimazole-betamethasone (LOTRISONE) cream Apply to affected area 2 times daily prn 80 g Particia Nearing, PA-C   amoxicillin-clavulanate (AUGMENTIN) 875-125 MG tablet Take 1 tablet by mouth every 12 (twelve) hours. 14 tablet Particia Nearing, [place]   lidocaine (XYLOCAINE) 2 % solution Use as directed 5 mLs in the mouth or throat as needed for mouth pain. 100 mL Particia Nearing, PA-C   chlorhexidine (PERIDEX) 0.12 % solution Use as directed 15 mLs in the mouth or throat 2 (two) times daily. 120 mL Particia Nearing, PA-C   naproxen (NAPROSYN) 500 MG tablet Take 1 tablet (500 mg total) by mouth 2 (two) times daily as needed. 30 tablet Particia Nearing, [place]      PDMP not reviewed this encounter.   Particia Nearing, [place] 09/12/21 1416

## 2021-10-04 IMAGING — XA DG C-ARM 1-60 MIN
1 series · 3 of 3 positions shown · non-contrast
Comparison: Radiographs 12/25/2020

CLINICAL DATA: Left fifth proximal phalanx fracture fixation.

EXAM:
LEFT LITTLE FINGER 2+V; DG C-ARM 1-60 MIN

[Series 1: unknown protocol · left · 0.30mm/px · 3 of 3 slices shown]
[im 1/3]
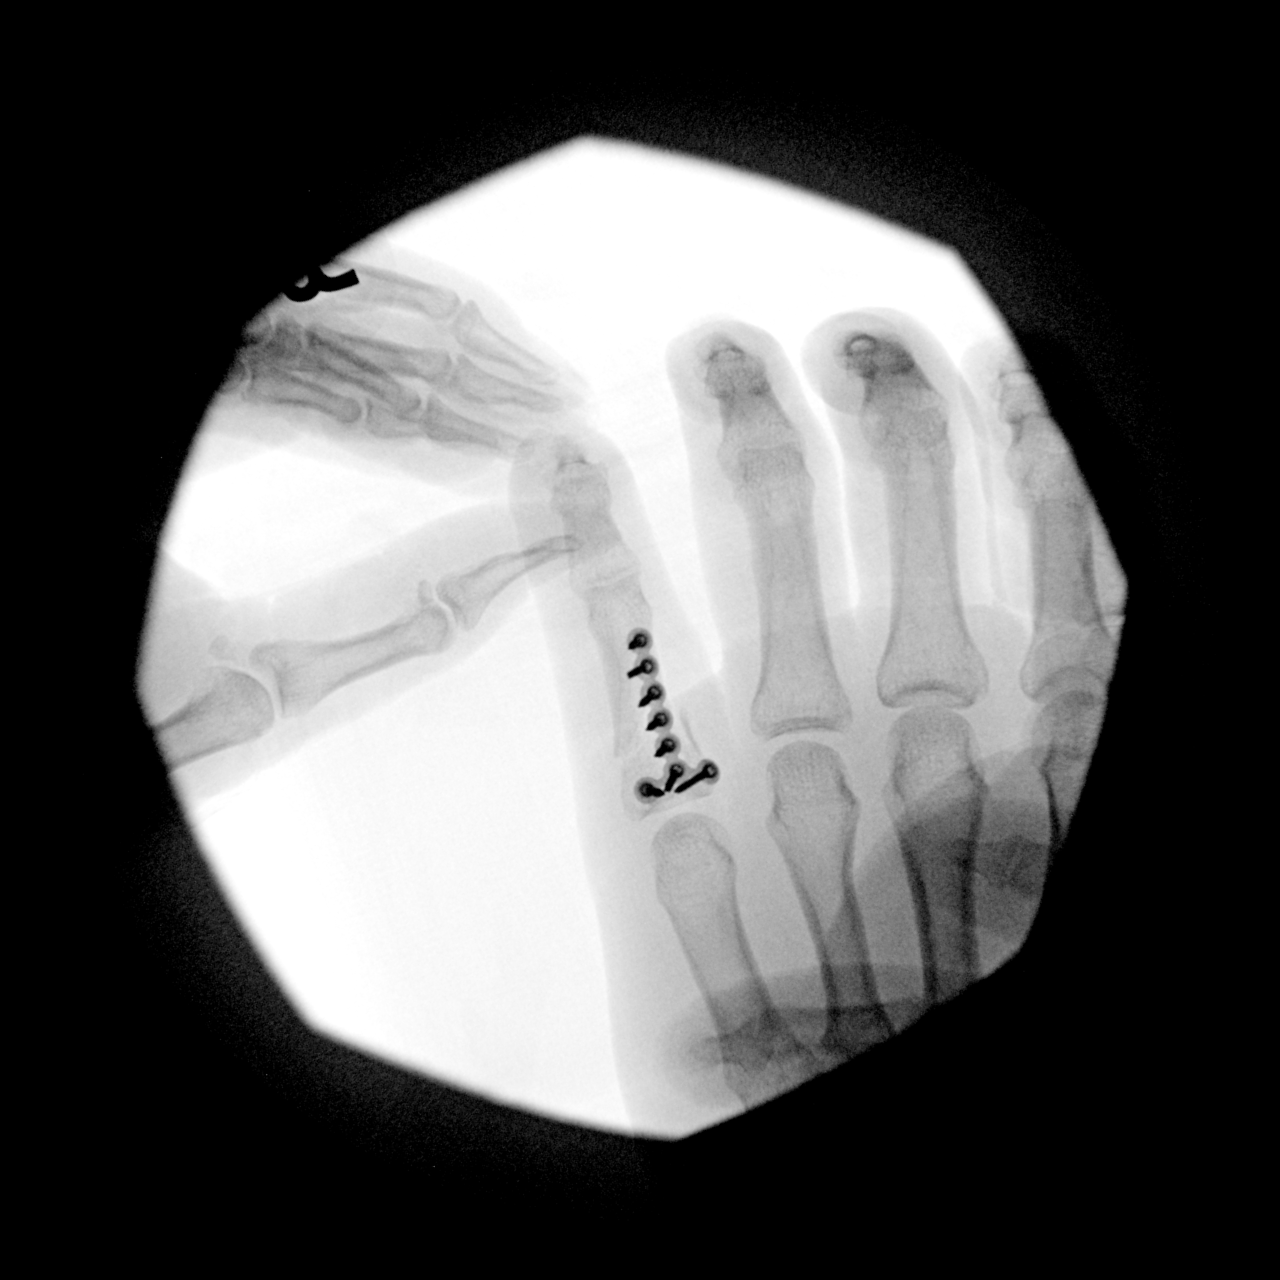
[im 2/3]
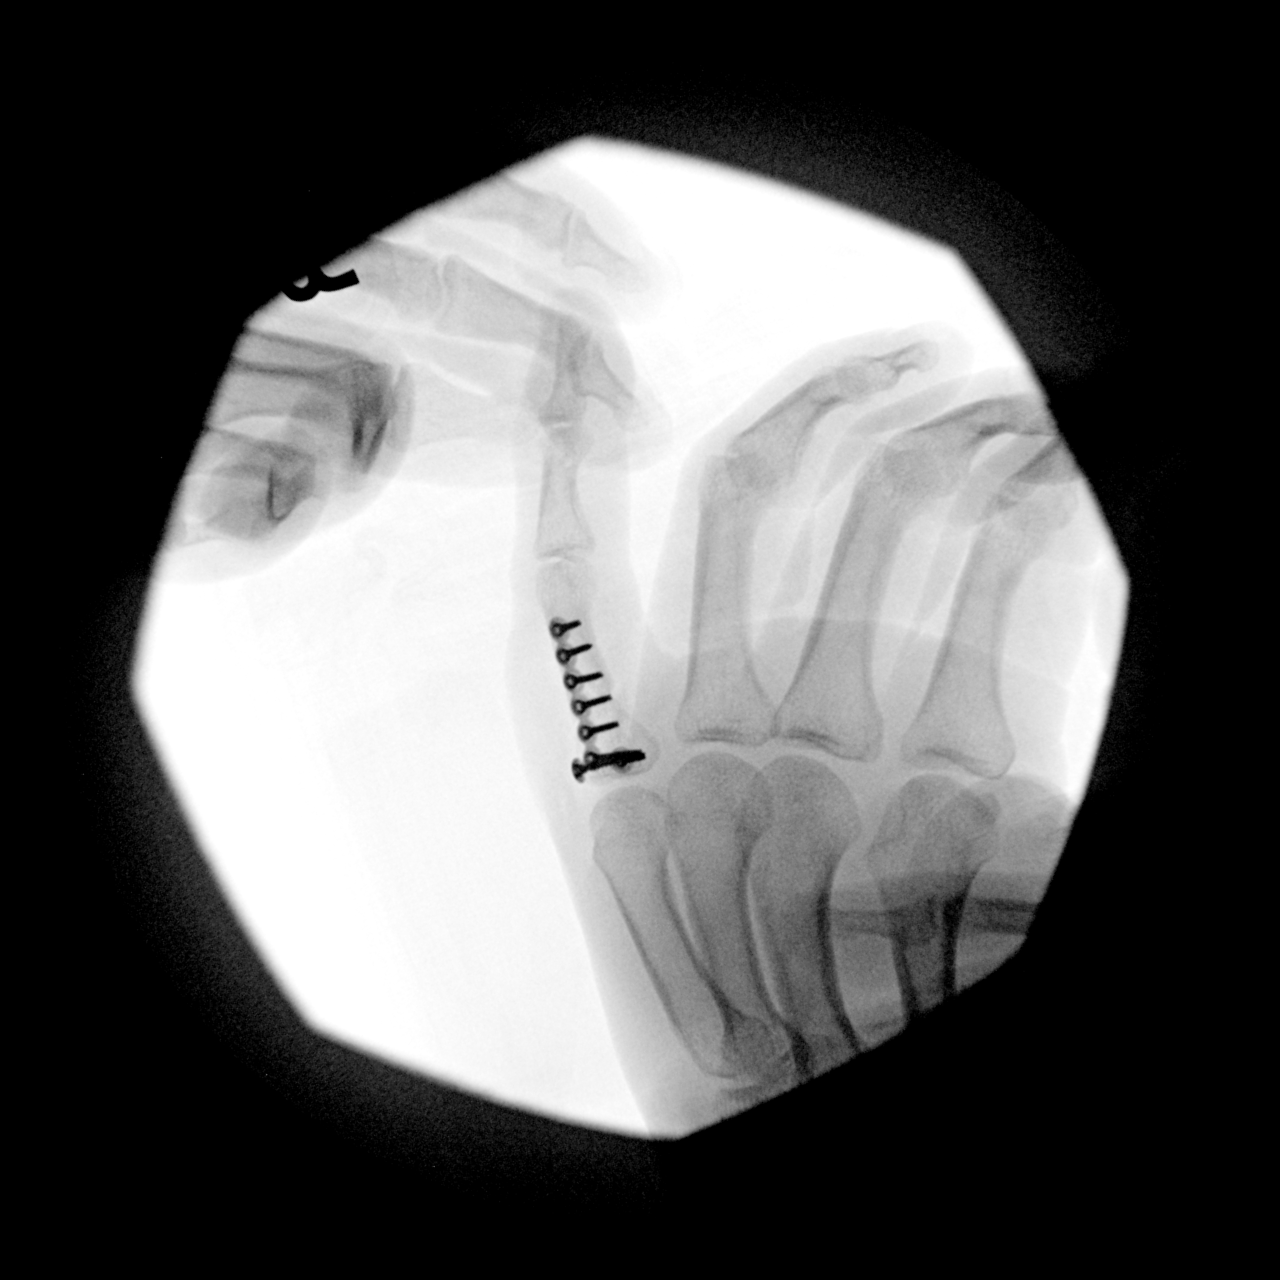
[im 3/3]
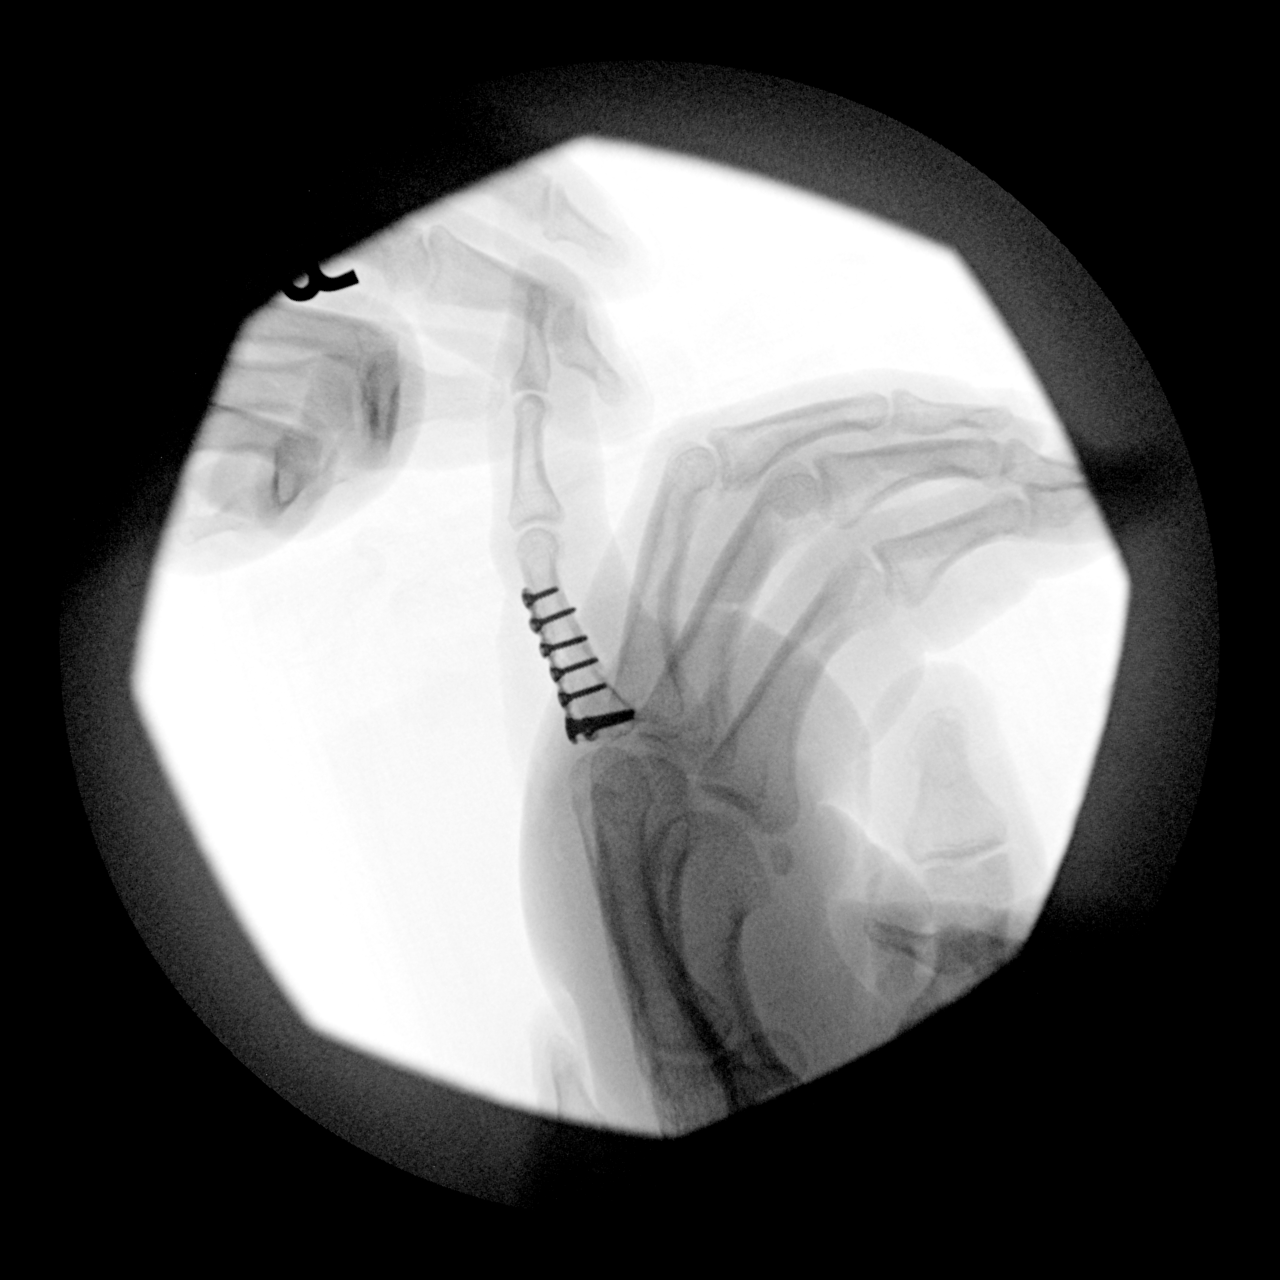

[3 of 3 positions shown; findings below may reference images not displayed]

FINDINGS: 3 fluoroscopic spot images demonstrate a T shaped dorsal plate and
several fixating screws traversing the proximal phalanx fracture.
Good position and alignment with anatomic reduction.
IMPRESSION: Internal fixation of the proximal phalanx fracture with anatomic
reduction.

## 2021-10-04 IMAGING — XA DG FINGER LITTLE 2+V*L*
1 series · 3 of 3 positions shown · non-contrast
Comparison: Radiographs 12/25/2020

CLINICAL DATA: Left fifth proximal phalanx fracture fixation.

EXAM:
LEFT LITTLE FINGER 2+V; DG C-ARM 1-60 MIN

[Series 1: unknown protocol · left · 0.30mm/px · 3 of 3 slices shown]
[im 1/3]
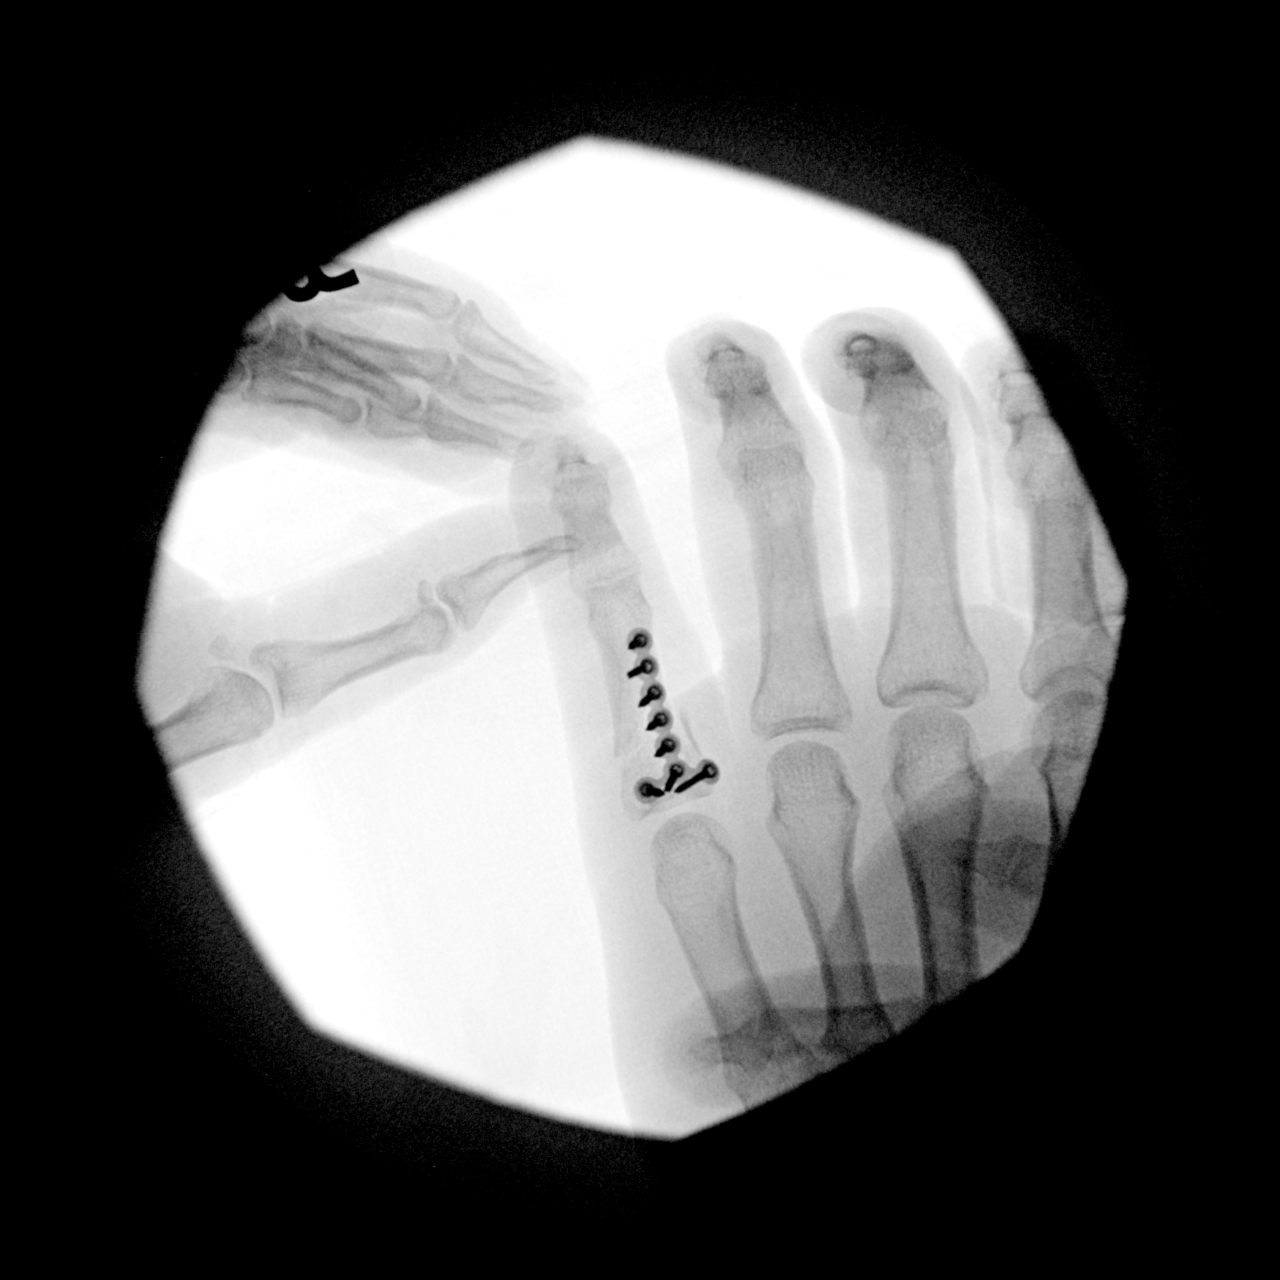
[im 2/3]
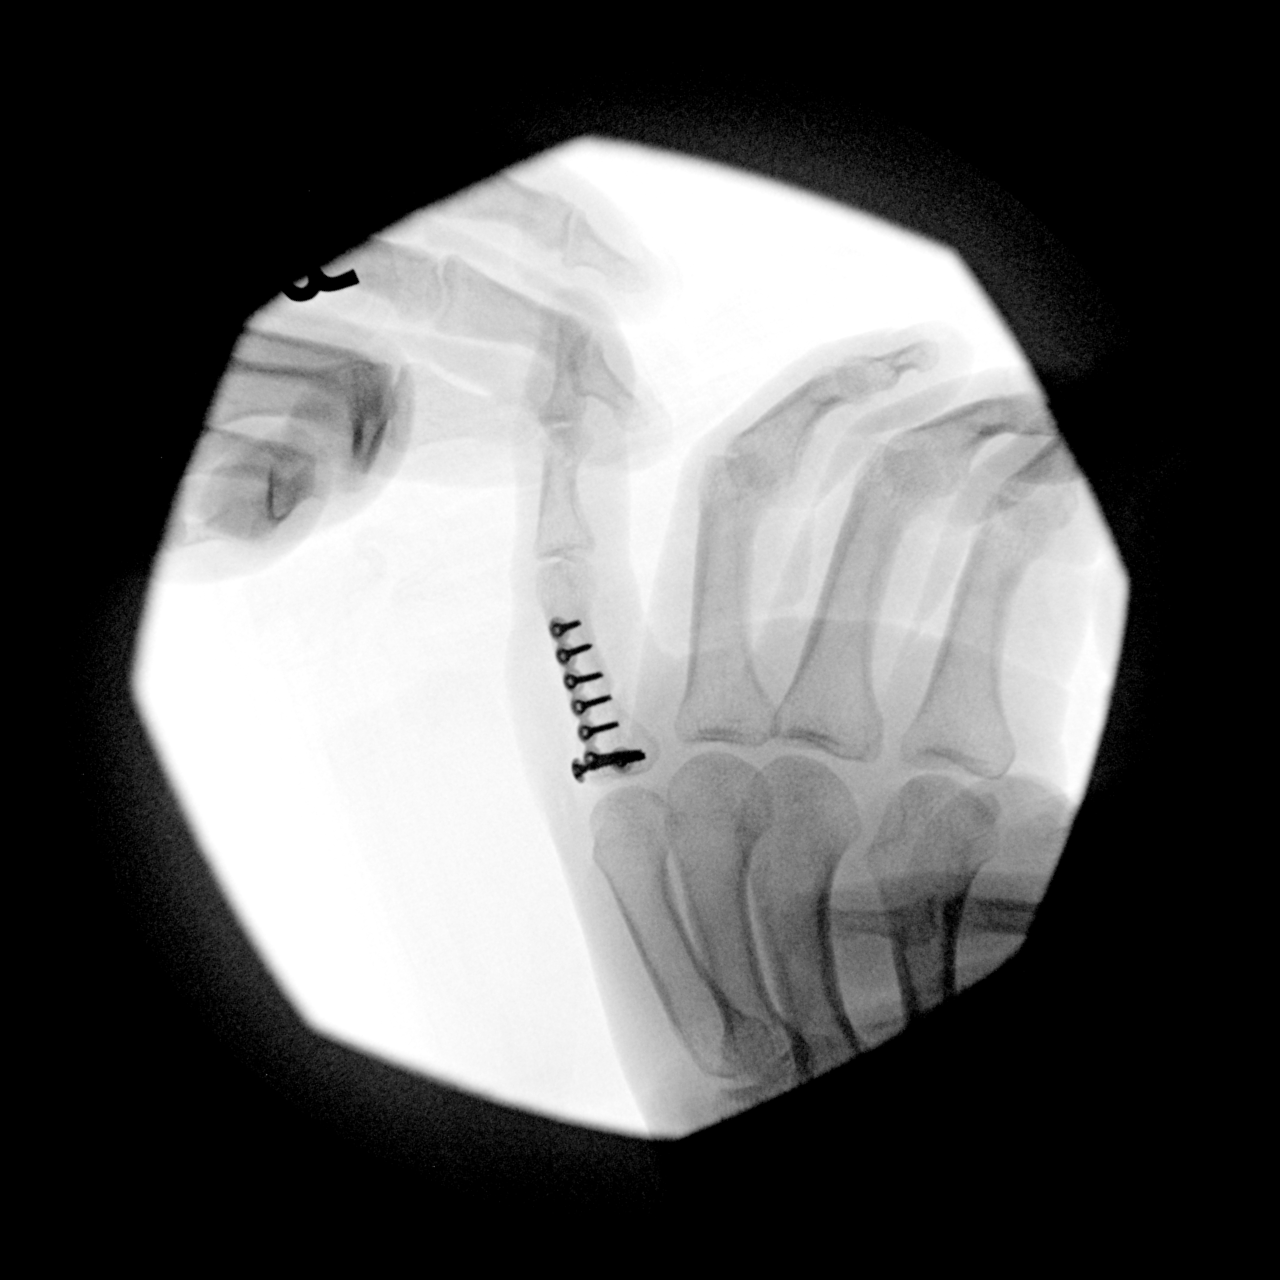
[im 3/3]
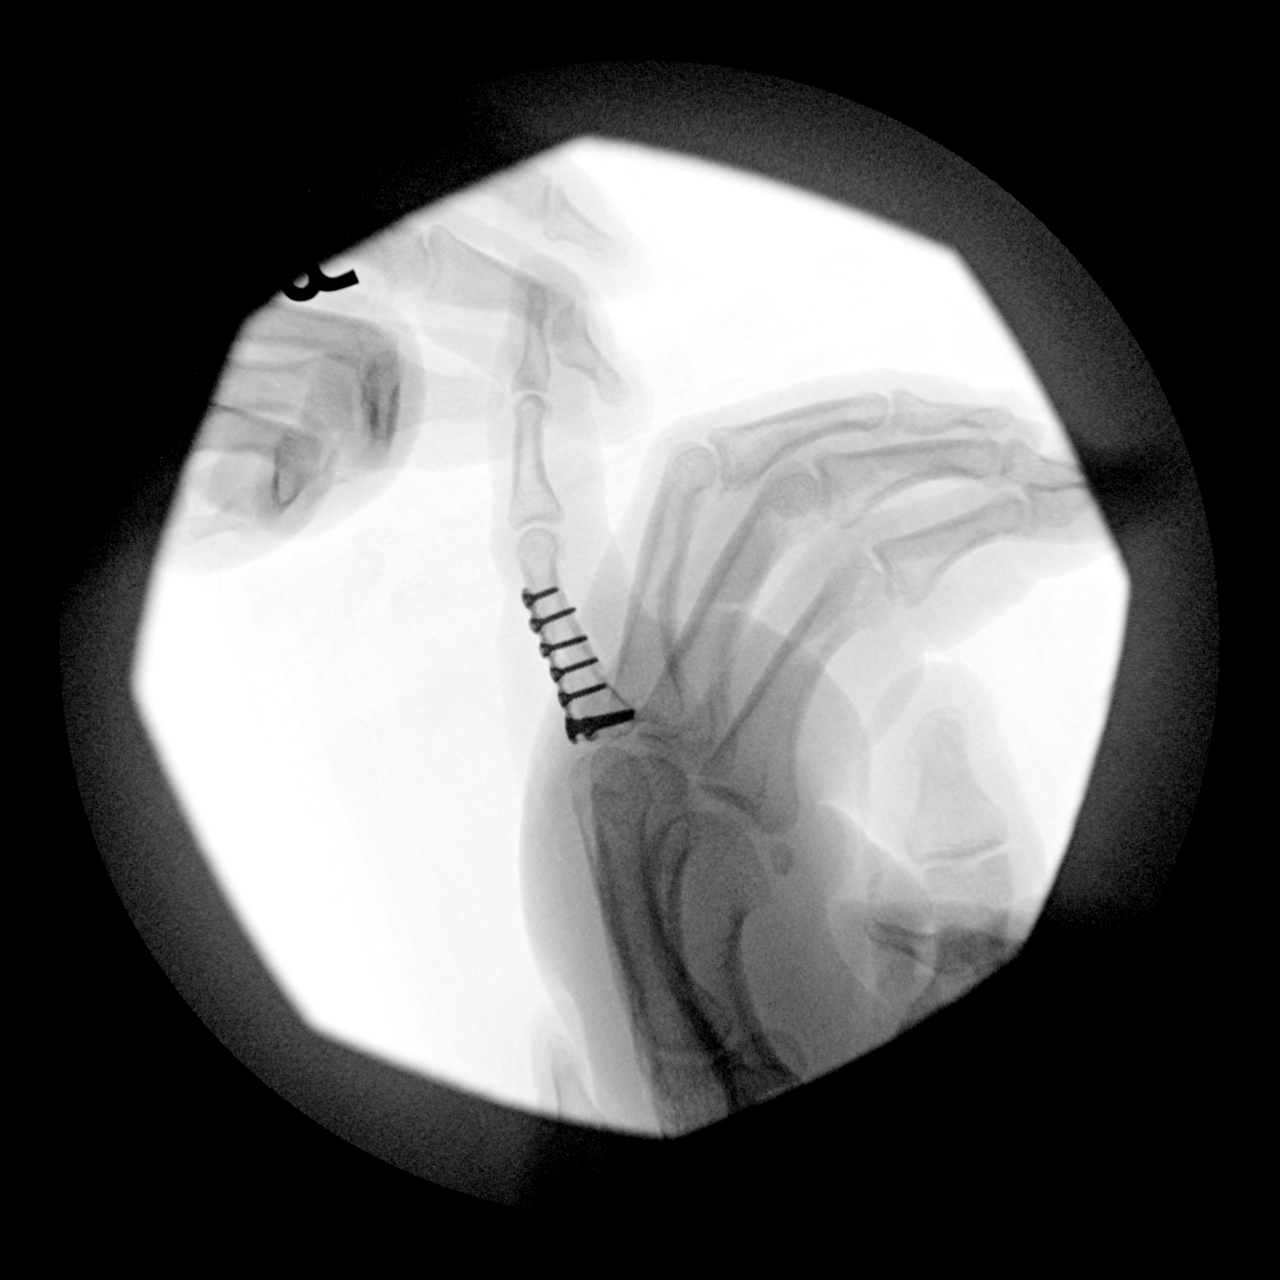

[3 of 3 positions shown; findings below may reference images not displayed]

FINDINGS: 3 fluoroscopic spot images demonstrate a T shaped dorsal plate and
several fixating screws traversing the proximal phalanx fracture.
Good position and alignment with anatomic reduction.
IMPRESSION: Internal fixation of the proximal phalanx fracture with anatomic
reduction.

## 2021-12-13 ENCOUNTER — Other Ambulatory Visit: Payer: Self-pay

## 2021-12-13 ENCOUNTER — Emergency Department (HOSPITAL_COMMUNITY)
Admission: EM | Admit: 2021-12-13 | Discharge: 2021-12-13 | Disposition: A | Discharge status: Home / Self Care | Payer: Self-pay | Attending: Student | Admitting: Student

## 2021-12-13 ENCOUNTER — Encounter (HOSPITAL_COMMUNITY): Payer: Self-pay | Admitting: Emergency Medicine

## 2021-12-13 DIAGNOSIS — X500XXA Overexertion from strenuous movement or load, initial encounter: Secondary | ICD-10-CM | POA: Insufficient documentation

## 2021-12-13 DIAGNOSIS — F17210 Nicotine dependence, cigarettes, uncomplicated: Secondary | ICD-10-CM | POA: Insufficient documentation

## 2021-12-13 DIAGNOSIS — M545 Low back pain, unspecified: Secondary | ICD-10-CM | POA: Insufficient documentation

## 2021-12-13 DIAGNOSIS — Y990 Civilian activity done for income or pay: Secondary | ICD-10-CM | POA: Insufficient documentation

## 2021-12-13 MED ORDER — LIDOCAINE 5 % EX PTCH
1.0000 | MEDICATED_PATCH | CUTANEOUS | Status: DC
  Administered 2021-12-13: 1 via TRANSDERMAL
  Filled 2021-12-13: qty 1

## 2021-12-13 MED ORDER — NAPROXEN 375 MG PO TABS: 375.0000 mg | ORAL_TABLET | Freq: Two times a day (BID) | ORAL | 0 refills | Status: DC

## 2021-12-13 MED ORDER — LIDOCAINE 4 % EX PTCH: 1.0000 | MEDICATED_PATCH | Freq: Two times a day (BID) | CUTANEOUS | 0 refills | Status: DC

## 2021-12-13 MED ORDER — LIDOCAINE 5 % EX PTCH
1.0000 | MEDICATED_PATCH | CUTANEOUS | Status: DC
  Filled 2021-12-13: qty 1

## 2021-12-13 MED ORDER — KETOROLAC TROMETHAMINE 15 MG/ML IJ SOLN
15.0000 mg | Freq: Once | INTRAMUSCULAR | Status: AC
  Administered 2021-12-13: 15 mg via INTRAMUSCULAR
  Filled 2021-12-13: qty 1

## 2021-12-13 NOTE — ED Provider Notes (Signed)
[facility] EMERGENCY DEPARTMENT Provider Note  CSN: [id number] Arrival date & time: 12/13/21 0541  Chief Complaint(s) Back Pain  HPI Howard Newman is a 41 y.o. male who presents emergency department for evaluation of back pain.  Patient states that he works at [employer] and was lifting heavy potato sacks when he pulled his back.  Patient been taking Tylenol without relief.  Denies midline back pain, numbness, tingling, weakness, urinary incontinence, fecal incontinence or other red flag signs of back pain.  Denies chest pain, shortness of breath abdominal pain nausea vomiting or other complaints   Back Pain  Past Medical History Past Medical History:  Diagnosis Date   Dental disease    Environmental allergies    There are no problems to display for this patient.  Home Medication(s) Prior to Admission medications   Medication Sig Start Date End Date Taking? Authorizing Provider  acetaminophen (TYLENOL) 500 MG tablet Take 1,000 mg by mouth every 6 (six) hours as needed for moderate pain or headache.   Yes [provider]  Lidocaine (SALONPAS PAIN RELIEVING) 4 % PTCH Apply 1 each topically every 12 (twelve) hours. 12/13/21  Yes Verbie Babic, MD  naproxen (NAPROSYN) 375 MG tablet Take 1 tablet (375 mg total) by mouth 2 (two) times daily. 12/13/21  Yes Marquest Gunkel, MD  amoxicillin-clavulanate (AUGMENTIN) 875-125 MG tablet Take 1 tablet by mouth every 12 (twelve) hours. Patient not taking: Reported on 12/13/2021 09/12/21   Particia Nearing, PA-C  chlorhexidine (PERIDEX) 0.12 % solution Rinse mouth with 54mL for 30 seconds at bedtime. Patient not taking: Reported on 12/13/2021 05/02/21   Wallis Bamberg, PA-C  chlorhexidine (PERIDEX) 0.12 % solution Use as directed 15 mLs in the mouth or throat 2 (two) times daily. Patient not taking: Reported on 12/13/2021 09/12/21   Particia Nearing, PA-C  clotrimazole-betamethasone (LOTRISONE) cream  Apply to affected area 2 times daily prn Patient not taking: Reported on 12/13/2021 09/12/21   Particia Nearing, PA-C  lidocaine (XYLOCAINE) 2 % solution Use as directed 5 mLs in the mouth or throat as needed for mouth pain. Patient not taking: Reported on 12/13/2021 09/12/21   Particia Nearing, PA-C  methocarbamol (ROBAXIN) 500 MG tablet Take 1 tablet (500 mg total) by mouth 2 (two) times daily. Patient not taking: Reported on 12/13/2021 07/23/21   Garlon Hatchet, PA-C  triamcinolone (KENALOG) 0.1 % paste Use as directed 1 application in the mouth or throat 2 (two) times daily. Patient not taking: Reported on 12/13/2021 05/02/21   Wallis Bamberg, PA-C                                                                                                                                    Past Surgical History Past Surgical History:  Procedure Laterality Date   MOUTH SURGERY     OPEN REDUCTION INTERNAL FIXATION (ORIF) PROXIMAL PHALANX Left 12/29/2020  Procedure: Open treatment of left small finger proximal phalanx fracture;  Surgeon: Mack Hook, MD;  Location: [facility];  Service: Orthopedics;  Laterality: Left;  with preop regional block   Family History Family History  Problem Relation Age of Onset   Heart failure Mother    Diabetes Mother    Cancer Mother    Healthy Father     Social History Social History   Tobacco Use   Smoking status: Some Days    Packs/day: 0.00    Types: Cigars, Cigarettes   Smokeless tobacco: Never  Vaping Use   Vaping Use: Never used  Substance Use Topics   Alcohol use: Yes   Drug use: Yes    Types: Marijuana   Allergies Patient has no known allergies.  Review of Systems Review of Systems  Musculoskeletal:  Positive for back pain.   Physical Exam Vital Signs  I have reviewed the triage vital signs BP 128/82 (BP Location: Right Arm)    Pulse 88    Temp 98.6 F (37 C) (Oral)    Resp 14    Ht 5\' 11"  (1.803 m)    Wt 77.1 kg    SpO2  99%    BMI 23.71 kg/m   Physical Exam Vitals and nursing note reviewed.  Constitutional:      General: He is not in acute distress.    Appearance: He is well-developed.  HENT:     Head: Normocephalic and atraumatic.  Eyes:     Conjunctiva/sclera: Conjunctivae normal.  Cardiovascular:     Rate and Rhythm: Normal rate and regular rhythm.     Heart sounds: No murmur heard. Pulmonary:     Effort: Pulmonary effort is normal. No respiratory distress.     Breath sounds: Normal breath sounds.  Abdominal:     Palpations: Abdomen is soft.     Tenderness: There is no abdominal tenderness.  Musculoskeletal:        General: Tenderness (Right paraspinal) present. No swelling.     Cervical back: Neck supple.  Skin:    General: Skin is warm and dry.     Capillary Refill: Capillary refill takes less than 2 seconds.  Neurological:     Mental Status: He is alert.  Psychiatric:        Mood and Affect: Mood normal.    ED Results and Treatments Labs (all labs ordered are listed, but only abnormal results are displayed) Labs Reviewed - No data to display                                                                                                                        Radiology No results found.  Pertinent labs & imaging results that were available during my care of the patient were reviewed by me and considered in my medical decision making (see MDM for details).  Medications Ordered in ED Medications  lidocaine (LIDODERM) 5 % 1 patch (1 patch Transdermal Patch Applied 12/13/21 0633)  lidocaine (LIDODERM) 5 % 1 patch (0 patches Transdermal Hold 12/13/21 0714)  ketorolac (TORADOL) 15 MG/ML injection 15 mg (15 mg Intramuscular Given 12/13/21 0630)                                                                                                                                     Procedures Procedures  (including critical care time)  Medical Decision Making / ED Course   This patient  presents to the ED for concern of back pain, this involves an extensive number of treatment options, and is a complaint that carries with it a high risk of complications and morbidity.  The differential diagnosis includes muscular strain, fracture, cord compression  MDM: Patient seen emergency department for evaluation of back pain.  Physical exam with right paraspinal muscle tenderness but is otherwise unremarkable.  Neurologic exam unremarkable.  Patient given Toradol and a lidocaine patch and on reevaluation his pain has improved.  Patient presentation consistent with lumbar strain and imaging not required in this scenario.  Patient then discharged with a prescription for Lidoderm and Naprosyn.   Additional history obtained:  -External records from outside source obtained and reviewed including: Chart review including previous notes, labs, imaging, consultation notes   Medicines ordered and prescription drug management: Meds ordered this encounter  Medications   ketorolac (TORADOL) 15 MG/ML injection 15 mg   lidocaine (LIDODERM) 5 % 1 patch   naproxen (NAPROSYN) 375 MG tablet    Sig: Take 1 tablet (375 mg total) by mouth 2 (two) times daily.    Dispense:  20 tablet    Refill:  0   Lidocaine (SALONPAS PAIN RELIEVING) 4 % PTCH    Sig: Apply 1 each topically every 12 (twelve) hours.    Dispense:  15 patch    Refill:  0   lidocaine (LIDODERM) 5 % 1 patch    -I have reviewed the patients home medicines and have made adjustments as needed  Critical interventions none   Cardiac Monitoring: The patient was maintained on a cardiac monitor.  I personally viewed and interpreted the cardiac monitored which showed an underlying rhythm of: NSR  Social Determinants of Health:  Factors impacting patients care include: none   Reevaluation: After the interventions noted above, I reevaluated the patient and found that they have :improved  Co morbidities that complicate the patient  evaluation  Past Medical History:  Diagnosis Date   Dental disease    Environmental allergies       Dispostion: I considered admission for this patient, but his back pain was treated successfully in the emergency department and he was able to ambulate on reevaluation.  Safe for outpatient follow-up.     Final Clinical Impression(s) / ED Diagnoses Final diagnoses:  Acute right-sided low back pain, unspecified whether sciatica present     @PCDICTATION @    Glendora Score, MD 12/13/21 [phone]

## 2021-12-13 NOTE — ED Triage Notes (Signed)
Tonight at work pt reports he was going to pick up a heavy package when his right lower back "tweaked."  Pt has been unable to bend or move w/o pain, Tylenol has not worked decrease pain. ?

## 2021-12-13 NOTE — ED Notes (Signed)
E-signature pad unavailable at time of pt discharge. This RN discussed discharge materials with pt and answered all pt questions. Pt stated understanding of discharge material. ? ?

## 2021-12-24 ENCOUNTER — Emergency Department (HOSPITAL_COMMUNITY)
Admission: EM | Admit: 2021-12-24 | Discharge: 2021-12-25 | Disposition: A | Discharge status: Home / Self Care | Payer: Self-pay | Attending: Emergency Medicine | Admitting: Emergency Medicine

## 2021-12-24 DIAGNOSIS — M5431 Sciatica, right side: Secondary | ICD-10-CM

## 2021-12-24 DIAGNOSIS — M5440 Lumbago with sciatica, unspecified side: Secondary | ICD-10-CM | POA: Insufficient documentation

## 2021-12-24 NOTE — ED Notes (Signed)
When I called pt for triage he was in the restroom ?

## 2021-12-25 ENCOUNTER — Other Ambulatory Visit: Payer: Self-pay

## 2021-12-25 ENCOUNTER — Encounter (HOSPITAL_COMMUNITY): Payer: Self-pay

## 2021-12-25 MED ORDER — PREDNISONE 20 MG PO TABS
60.0000 mg | ORAL_TABLET | Freq: Once | ORAL | Status: AC
Start: 2021-12-25 — End: 2021-12-25
  Administered 2021-12-25: 60 mg via ORAL
  Filled 2021-12-25: qty 3

## 2021-12-25 MED ORDER — PREDNISONE 20 MG PO TABS: ORAL_TABLET | ORAL | 0 refills | Status: DC

## 2021-12-25 MED ORDER — ACETAMINOPHEN 500 MG PO TABS
1000.0000 mg | ORAL_TABLET | Freq: Once | ORAL | Status: AC
Start: 2021-12-25 — End: 2021-12-25
  Administered 2021-12-25: 1000 mg via ORAL
  Filled 2021-12-25: qty 2

## 2021-12-25 MED ORDER — LIDOCAINE 5 % EX PTCH: 1.0000 | MEDICATED_PATCH | CUTANEOUS | 0 refills | Status: DC

## 2021-12-25 MED ORDER — LIDOCAINE 5 % EX PTCH
3.0000 | MEDICATED_PATCH | CUTANEOUS | Status: DC
  Administered 2021-12-25: 3 via TRANSDERMAL
  Filled 2021-12-25: qty 3

## 2021-12-25 MED ORDER — KETOROLAC TROMETHAMINE 60 MG/2ML IM SOLN
60.0000 mg | Freq: Once | INTRAMUSCULAR | Status: AC
  Administered 2021-12-25: 60 mg via INTRAMUSCULAR
  Filled 2021-12-25: qty 2

## 2021-12-25 NOTE — ED Triage Notes (Signed)
Patient presents to ED with c/o right lower back pain ongoing x 2-3 weeks. Denies injury, states pain is intermittent.  ?

## 2021-12-25 NOTE — ED Provider Notes (Signed)
?[facility]-EMERGENCY DEPT ?Provider Note ? ? ?CSN: [id number] ?Arrival date & time: 12/24/21  2344 ? ?  ? ?History ? ?Chief Complaint  ?Patient presents with  ? Back Pain  ? ? ?Howard Newman is a 41 y.o. male. ? ?The history is provided by the patient.  ?Back Pain ?Location:  Gluteal region ?Quality:  Cramping ?Radiates to:  R posterior upper leg ?Pain severity:  Moderate ?Pain is:  Same all the time ?Onset quality:  Sudden ?Duration: weeks. ?Timing:  Constant ?Progression:  Waxing and waning ?Chronicity:  New ?Context: not MCA and not MVA   ?Relieved by:  Nothing ?Worsened by:  Nothing ?Ineffective treatments:  None tried ?Associated symptoms: no abdominal pain, no chest pain, no fever, no numbness and no weakness   ?Risk factors: no hx of cancer   ? ?  ? ?Home Medications ?Prior to Admission medications   ?Medication Sig Start Date End Date Taking? Authorizing Provider  ?acetaminophen (TYLENOL) 500 MG tablet Take 1,000 mg by mouth every 6 (six) hours as needed for moderate pain or headache.    [provider]  ?amoxicillin-clavulanate (AUGMENTIN) 875-125 MG tablet Take 1 tablet by mouth every 12 (twelve) hours. ?Patient not taking: Reported on 12/13/2021 09/12/21   Particia NearingLane, Rachel Elizabeth, PA-C  ?chlorhexidine (PERIDEX) 0.12 % solution Rinse mouth with 15mL for 30 seconds at bedtime. ?Patient not taking: Reported on 12/13/2021 05/02/21   Wallis BambergMani, Mario, PA-C  ?chlorhexidine (PERIDEX) 0.12 % solution Use as directed 15 mLs in the mouth or throat 2 (two) times daily. ?Patient not taking: Reported on 12/13/2021 09/12/21   Particia NearingLane, Rachel Elizabeth, PA-C  ?clotrimazole-betamethasone (LOTRISONE) cream Apply to affected area 2 times daily prn ?Patient not taking: Reported on 12/13/2021 09/12/21   Particia NearingLane, Rachel Elizabeth, PA-C  ?Lidocaine (SALONPAS PAIN RELIEVING) 4 % PTCH Apply 1 each topically every 12 (twelve) hours. 12/13/21   Kommor, Madison, MD  ?lidocaine (XYLOCAINE) 2 % solution Use as directed 5 mLs  in the mouth or throat as needed for mouth pain. ?Patient not taking: Reported on 12/13/2021 09/12/21   Particia NearingLane, Rachel Elizabeth, PA-C  ?methocarbamol (ROBAXIN) 500 MG tablet Take 1 tablet (500 mg total) by mouth 2 (two) times daily. ?Patient not taking: Reported on 12/13/2021 07/23/21   Garlon HatchetSanders, Lisa M, PA-C  ?naproxen (NAPROSYN) 375 MG tablet Take 1 tablet (375 mg total) by mouth 2 (two) times daily. 12/13/21   Kommor, Madison, MD  ?triamcinolone (KENALOG) 0.1 % paste Use as directed 1 application in the mouth or throat 2 (two) times daily. ?Patient not taking: Reported on 12/13/2021 05/02/21   Wallis BambergMani, Mario, PA-C  ?   ? ?Allergies    ?Patient has no known allergies.   ? ?Review of Systems   ?Review of Systems  ?Constitutional:  Negative for fever.  ?HENT:  Negative for facial swelling.   ?Eyes:  Negative for redness.  ?Respiratory:  Negative for shortness of breath.   ?Cardiovascular:  Negative for chest pain.  ?Gastrointestinal:  Negative for abdominal pain.  ?Genitourinary:  Negative for difficulty urinating.  ?Musculoskeletal:  Positive for back pain.  ?Neurological:  Negative for facial asymmetry, weakness and numbness.  ?All other systems reviewed and are negative. ? ?Physical Exam ?Updated Vital Signs ?BP 135/82 (BP Location: Right Arm)   Pulse 91   Temp 99 ?F (37.2 ?C) (Oral)   Resp 16   Ht 5\' 11"  (1.803 m)   Wt 77.1 kg   SpO2 96%   BMI 23.71 kg/m?  ?  Physical Exam ?Vitals and nursing note reviewed.  ?Constitutional:   ?   General: He is not in acute distress. ?   Appearance: Normal appearance.  ?HENT:  ?   Head: Normocephalic and atraumatic.  ?   Nose: Nose normal.  ?Eyes:  ?   Conjunctiva/sclera: Conjunctivae normal.  ?   Pupils: Pupils are equal, round, and reactive to light.  ?Cardiovascular:  ?   Rate and Rhythm: Normal rate and regular rhythm.  ?   Pulses: Normal pulses.  ?   Heart sounds: Normal heart sounds.  ?Pulmonary:  ?   Effort: Pulmonary effort is normal.  ?   Breath sounds: Normal breath sounds.   ?Abdominal:  ?   General: Bowel sounds are normal.  ?   Palpations: Abdomen is soft.  ?   Tenderness: There is no abdominal tenderness. There is no guarding.  ?Musculoskeletal:     ?   General: Normal range of motion.  ?   Cervical back: Normal range of motion and neck supple.  ?Skin: ?   General: Skin is warm and dry.  ?   Capillary Refill: Capillary refill takes less than 2 seconds.  ?Neurological:  ?   General: No focal deficit present.  ?   Mental Status: He is alert and oriented to person, place, and time.  ?   Deep Tendon Reflexes: Reflexes normal.  ?Psychiatric:     ?   Mood and Affect: Mood normal.     ?   Behavior: Behavior normal.  ? ? ?ED Results / Procedures / Treatments   ?Labs ?(all labs ordered are listed, but only abnormal results are displayed) ?Labs Reviewed - No data to display ? ?EKG ?None ? ?Radiology ?No results found. ? ?Procedures ?Procedures  ? ? ?Medications Ordered in ED ?Medications  ?lidocaine (LIDODERM) 5 % 3 patch (3 patches Transdermal Patch Applied 12/25/21 0032)  ?ketorolac (TORADOL) injection 60 mg (60 mg Intramuscular Given 12/25/21 0032)  ?acetaminophen (TYLENOL) tablet 1,000 mg (1,000 mg Oral Given 12/25/21 0031)  ?predniSONE (DELTASONE) tablet 60 mg (60 mg Oral Given 12/25/21 0032)  ? ? ?ED Course/ Medical Decision Making/ A&P ?  ?                        ?Medical Decision Making ?Patient with sciatica with worsening symptoms.  No weakness no numbness no bowel or bladder symptoms  ? ?Amount and/or Complexity of Data Reviewed ?External Data Reviewed: notes. ?   Details: previous ED notes reviewed ? ?Risk ?OTC drugs. ?Prescription drug management. ?Risk Details: Patient with sciatica.  Will start steroids and lidoderm and refer to sports medicine.  No bowel or bladder symptoms.  No weakness. No fevers, no gait issues.  I do not believe this is cauda equina.  Stable for discharge with close follow up.   ? ? ?Final Clinical Impression(s) / ED Diagnoses ?Final diagnoses:  ?None   ? ?Return for intractable cough, coughing up blood, fevers > 100.4 unrelieved by medication, shortness of breath, intractable vomiting, chest pain, shortness of breath, weakness, numbness, changes in speech, facial asymmetry, abdominal pain, passing out, Inability to tolerate liquids or food, cough, altered mental status or any concerns. No signs of systemic illness or infection. The patient is nontoxic-appearing on exam and vital signs are within normal limits.  ?I have reviewed the triage vital signs and the nursing notes. Pertinent labs & imaging results that were available during my care of the patient were reviewed  by me and considered in my medical decision making (see chart for details). After history, exam, and medical workup I feel the patient has been appropriately medically screened and is safe for discharge home. Pertinent diagnoses were discussed with the patient. Patient was given return precautions. ?Rx / DC Orders ?ED Discharge Orders   ? ? None  ? ?  ? ? ?  ?Neasia Fleeman, MD ?12/25/21 0131 ? ?

## 2022-01-08 ENCOUNTER — Ambulatory Visit (HOSPITAL_COMMUNITY)
Admission: RE | Admit: 2022-01-08 | Discharge: 2022-01-08 | Disposition: A | Discharge status: Home / Self Care | Payer: Self-pay | Source: Ambulatory Visit

## 2022-01-08 VITALS — BP 126/79 | HR 75 | Temp 97.6°F | Resp 16

## 2022-01-08 DIAGNOSIS — M5441 Lumbago with sciatica, right side: Secondary | ICD-10-CM

## 2022-01-08 NOTE — Discharge Instructions (Addendum)
You need to take the medications as previously prescribed including the muscle relaxant, the topical patches and the naproxen.  Do not take muscle relaxant while operating machinery or driving you may want to just take it before you go to sleep.  2-day off work to help rest the back.  Return if new or worsening symptoms at any time for reevaluation ?

## 2022-01-08 NOTE — ED Provider Notes (Signed)
?[facility] ? ? ?MRN: [id number] DOB: November 08, 1980 ? ?Subjective:  ? ?Chief Complaint;  ?Chief Complaint  ?Patient presents with  ? Back Pain  ? ? ?Howard Newman is a 41 y.o. male presenting for acute right lower back pain with radiation of pain down his right leg and buttocks.  Patient was seen for the same approximately 3 weeks ago but forgot that he had been prescribed Naprosyn, Robaxin and Salonpas lidocaine patches.  He has been taking one 200 mg tablet of Motrin a.m. and p.m.  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 ? ?No current facility-administered medications for this encounter. ? ?Current Outpatient Medications:  ?  acetaminophen (TYLENOL) 500 MG tablet, Take 1,000 mg by mouth every 6 (six) hours as needed for moderate pain or headache., Disp: , Rfl:  ?  amoxicillin-clavulanate (AUGMENTIN) 875-125 MG tablet, Take 1 tablet by mouth every 12 (twelve) hours. (Patient not taking: Reported on 12/13/2021), Disp: 14 tablet, Rfl: 0 ?  chlorhexidine (PERIDEX) 0.12 % solution, Rinse mouth with 72mL for 30 seconds at bedtime. (Patient not taking: Reported on 12/13/2021), Disp: 200 mL, Rfl: 0 ?  chlorhexidine (PERIDEX) 0.12 % solution, Use as directed 15 mLs in the mouth or throat 2 (two) times daily. (Patient not taking: Reported on 12/13/2021), Disp: 120 mL, Rfl: 0 ?  clotrimazole-betamethasone (LOTRISONE) cream, Apply to affected area 2 times daily prn (Patient not taking: Reported on 12/13/2021), Disp: 80 g, Rfl: 2 ?  lidocaine (LIDODERM) 5 %, Place 1 patch onto the skin daily. Remove & Discard patch within 12 hours or as directed by MD, Disp: 30 patch, Rfl: 0 ?  Lidocaine (SALONPAS PAIN RELIEVING) 4 % PTCH, Apply 1 each topically every 12 (twelve) hours., Disp: 15 patch, Rfl: 0 ?  lidocaine (XYLOCAINE) 2 % solution, Use as directed 5 mLs in the mouth or throat as needed for mouth pain. (Patient not taking: Reported on 12/13/2021), Disp: 100  mL, Rfl: 0 ?  methocarbamol (ROBAXIN) 500 MG tablet, Take 1 tablet (500 mg total) by mouth 2 (two) times daily. (Patient not taking: Reported on 12/13/2021), Disp: 20 tablet, Rfl: 0 ?  naproxen (NAPROSYN) 375 MG tablet, Take 1 tablet (375 mg total) by mouth 2 (two) times daily., Disp: 20 tablet, Rfl: 0 ?  predniSONE (DELTASONE) 20 MG tablet, 2 tabs po daily x 3 days, Disp: 6 tablet, Rfl: 0 ?  triamcinolone (KENALOG) 0.1 % paste, Use as directed 1 application in the mouth or throat 2 (two) times daily. (Patient not taking: Reported on 12/13/2021), Disp: 5 g, Rfl: 0  ? ?No Known Allergies ? ?Past Medical History:  ?Diagnosis Date  ? Dental disease   ? Environmental allergies   ?  ? ?Review of Systems  ?All other systems reviewed and are negative. ? ? ?Objective:  ? ?Vitals: ?BP 126/79 (BP Location: Left Arm)   Pulse 75   Temp 97.6 ?F (36.4 ?C) (Oral)   Resp 16   SpO2 95%  ? ?Physical Exam ?Vitals and nursing note reviewed.  ?Constitutional:   ?   General: He is not in acute distress. ?   Appearance: He is well-developed.  ?HENT:  ?   Head: Normocephalic and atraumatic.  ?Eyes:  ?   Conjunctiva/sclera: Conjunctivae normal.  ?Cardiovascular:  ?   Rate and Rhythm: Normal rate and regular rhythm.  ?   Heart sounds: No murmur heard. ?Pulmonary:  ?   Effort:  Pulmonary effort is normal. No respiratory distress.  ?   Breath sounds: Normal breath sounds.  ?Abdominal:  ?   Palpations: Abdomen is soft.  ?   Tenderness: There is no abdominal tenderness.  ?Musculoskeletal:     ?   General: No swelling.  ?   Cervical back: Neck supple.  ?   Comments: Lower back has full range of motion with and tenderness in full flexion and extension.  Tenderness to palpation lower right back and buttocks region.  No rash no NVD D no evidence of cauda equina.  Patient is ambulatory without noted limp  ?Skin: ?   General: Skin is warm and dry.  ?   Capillary Refill: Capillary refill takes less than 2 seconds.  ?Neurological:  ?   Mental Status: He  is alert.  ?Psychiatric:     ?   Mood and Affect: Mood normal.  ? ? ?No results found for this or any previous visit (from the past 24 hour(s)). ? ?No results found.  ?  ? ?Assessment and Plan :  ? ?1. Acute right-sided low back pain with right-sided sciatica   ? ? ?No orders of the defined types were placed in this encounter. ? ? ?MDM:  ?Howard Newman is a 41 y.o. male presenting for right lower back pain with symptoms of sciatica on the right side.  Patient states he forgot that he was previously prescribed medications including muscle relaxant, stronger anti-inflammatory and Salonpas patches which he states he will start using.  He is taken off work for 2 days to help with back rest since his work likely exacerbates the pain.  I discussed treatment, follow up and return instructions. Questions were answered. Patient stated understanding of instructions and is stable for discharge. ? ?Leida Lauth FNP-C MCN  ?  ?Hezzie Bump, NP ?01/08/22 [phone] ? ?

## 2022-01-08 NOTE — ED Triage Notes (Signed)
Pt presents to urgent care with c/o right lower pain x 1-2 weeks. No known injury or falls. ?

## 2022-03-04 ENCOUNTER — Emergency Department (HOSPITAL_COMMUNITY)
Admission: EM | Admit: 2022-03-04 | Discharge: 2022-03-04 | Disposition: A | Discharge status: Home / Self Care | Payer: Self-pay | Attending: Emergency Medicine | Admitting: Emergency Medicine

## 2022-03-04 ENCOUNTER — Other Ambulatory Visit: Payer: Self-pay

## 2022-03-04 ENCOUNTER — Encounter (HOSPITAL_COMMUNITY): Payer: Self-pay

## 2022-03-04 DIAGNOSIS — M25512 Pain in left shoulder: Secondary | ICD-10-CM

## 2022-03-04 DIAGNOSIS — M546 Pain in thoracic spine: Secondary | ICD-10-CM | POA: Insufficient documentation

## 2022-03-04 DIAGNOSIS — X500XXA Overexertion from strenuous movement or load, initial encounter: Secondary | ICD-10-CM | POA: Insufficient documentation

## 2022-03-04 DIAGNOSIS — M25511 Pain in right shoulder: Secondary | ICD-10-CM | POA: Insufficient documentation

## 2022-03-04 DIAGNOSIS — M542 Cervicalgia: Secondary | ICD-10-CM | POA: Insufficient documentation

## 2022-03-04 MED ORDER — CELECOXIB 200 MG PO CAPS: 200.0000 mg | ORAL_CAPSULE | Freq: Two times a day (BID) | ORAL | 0 refills | Status: DC

## 2022-03-04 MED ORDER — TRAMADOL HCL 50 MG PO TABS: 50.0000 mg | ORAL_TABLET | Freq: Four times a day (QID) | ORAL | 0 refills | Status: DC | PRN

## 2022-03-04 MED ORDER — CYCLOBENZAPRINE HCL 10 MG PO TABS
5.0000 mg | ORAL_TABLET | Freq: Two times a day (BID) | ORAL | 0 refills | Status: DC | PRN
Start: 2022-03-04 — End: 2022-03-29

## 2022-03-04 NOTE — ED Triage Notes (Signed)
Patient  states 3-4 days a o he came in from work and fell asleep on the couch. Patient c/o left lateral neck pain and that radiates into the upper back and left shoulder.

## 2022-03-04 NOTE — Discharge Instructions (Signed)
Google search: Self-help for trigger point of left shoulder affecting neck and upper back.  There should be multiple resources online to help you with this pain.  You may follow-up with the following therapists Bennie Dallas- [facility] [address] [phone] Ernesta Amble  [facility]  [address] [place], [place] [address] [phone]  Get help right away if: You develop new bowel or bladder control problems. You have unusual weakness or numbness in your arms or legs. You feel faint.

## 2022-03-04 NOTE — ED Provider Notes (Signed)
[facility]-EMERGENCY DEPT Provider Note   CSN: [id number] Arrival date & time: 03/04/22  1648     History  Chief Complaint  Patient presents with   Neck Pain   Back Pain    Howard Newman is a 41 y.o. male who presents emergency department chief complaint of left shoulder and neck pain.  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  He was extremely tired, fell asleep on the couch with his head resting his left hand.  When he woke up he had severe left-sided neck and shoulder pain which have been progressively worsening.  He describes the pain as constant, deep, aching.  At times it catches and he also has to catch his breath due to the pain.  He is taken naproxen, lidocaine patches, ibuprofen without any relief of his symptoms.  He states that he is having severe difficulty sleeping due to the pain.  He has no weakness of the upper extremities.  He has pain when he turns his neck in the left shoulder region   Neck Pain Back Pain     Home Medications Prior to Admission medications   Medication Sig Start Date End Date Taking? Authorizing Provider  acetaminophen (TYLENOL) 500 MG tablet Take 1,000 mg by mouth every 6 (six) hours as needed for moderate pain or headache.    [provider]  amoxicillin-clavulanate (AUGMENTIN) 875-125 MG tablet Take 1 tablet by mouth every 12 (twelve) hours. Patient not taking: Reported on 12/13/2021 09/12/21   Particia Nearing, PA-C  chlorhexidine (PERIDEX) 0.12 % solution Rinse mouth with 18mL for 30 seconds at bedtime. Patient not taking: Reported on 12/13/2021 05/02/21   Wallis Bamberg, PA-C  chlorhexidine (PERIDEX) 0.12 % solution Use as directed 15 mLs in the mouth or throat 2 (two) times daily. Patient not taking: Reported on 12/13/2021 09/12/21   Particia Nearing, PA-C  clotrimazole-betamethasone (LOTRISONE) cream Apply to affected area 2 times daily prn Patient not taking: Reported on 12/13/2021  09/12/21   Particia Nearing, PA-C  lidocaine (LIDODERM) 5 % Place 1 patch onto the skin daily. Remove & Discard patch within 12 hours or as directed by MD 12/25/21   Nicanor Alcon, April, MD  Lidocaine (SALONPAS PAIN RELIEVING) 4 % PTCH Apply 1 each topically every 12 (twelve) hours. 12/13/21   Kommor, Madison, MD  lidocaine (XYLOCAINE) 2 % solution Use as directed 5 mLs in the mouth or throat as needed for mouth pain. Patient not taking: Reported on 12/13/2021 09/12/21   Particia Nearing, PA-C  methocarbamol (ROBAXIN) 500 MG tablet Take 1 tablet (500 mg total) by mouth 2 (two) times daily. Patient not taking: Reported on 12/13/2021 07/23/21   Garlon Hatchet, PA-C  naproxen (NAPROSYN) 375 MG tablet Take 1 tablet (375 mg total) by mouth 2 (two) times daily. 12/13/21   Kommor, Madison, MD  predniSONE (DELTASONE) 20 MG tablet 2 tabs po daily x 3 days 12/25/21   Palumbo, April, MD  triamcinolone (KENALOG) 0.1 % paste Use as directed 1 application in the mouth or throat 2 (two) times daily. Patient not taking: Reported on 12/13/2021 05/02/21   Wallis Bamberg, PA-C      Allergies    Patient has no known allergies.    Review of Systems   Review of Systems  Musculoskeletal:  Positive for back pain and neck pain.   Physical Exam Updated Vital Signs BP (!) 133/91 (BP Location: Right Arm) Comment: Simultaneous filing. User may  not have seen previous data.  Pulse 86 Comment: Simultaneous filing. User may not have seen previous data.  Temp 98.1 F (36.7 C) (Oral) Comment: Simultaneous filing. User may not have seen previous data. Comment (Src): Simultaneous filing. User may not have seen previous data.  Resp 16 Comment: Simultaneous filing. User may not have seen previous data.  Ht 5\' 11"  (1.803 m)   Wt 87.1 kg   SpO2 97% Comment: Simultaneous filing. User may not have seen previous data.  BMI 26.78 kg/m  Physical Exam Vitals and nursing note reviewed.  Constitutional:      General: He is not in acute  distress.    Appearance: He is well-developed. He is not diaphoretic.  HENT:     Head: Normocephalic and atraumatic.  Eyes:     General: No scleral icterus.    Conjunctiva/sclera: Conjunctivae normal.  Cardiovascular:     Rate and Rhythm: Normal rate and regular rhythm.     Heart sounds: Normal heart sounds.  Pulmonary:     Effort: Pulmonary effort is normal. No respiratory distress.     Breath sounds: Normal breath sounds.  Abdominal:     Palpations: Abdomen is soft.     Tenderness: There is no abdominal tenderness.  Musculoskeletal:     Cervical back: Normal range of motion and neck supple.     Comments: Multiple palpable trigger points noted in the left scapular region, range of motion of the neck is limited due to pain with movement on the left side of the neck, normal upper extremity strength, sensation and equal grips.  2+ DP pulse  Skin:    General: Skin is warm and dry.  Neurological:     Mental Status: He is alert.  Psychiatric:        Behavior: Behavior normal.    ED Results / Procedures / Treatments   Labs (all labs ordered are listed, but only abnormal results are displayed) Labs Reviewed - No data to display  EKG None  Radiology No results found.  Procedures Procedures   Medications Ordered in ED Medications - No data to display  ED Course/ Medical Decision Making/ A&P                           Medical Decision Making Patient with acute left upper thoracic back pain consistent with muscle spasm, trigger points that are active.  I spent a good amount of time in patient education with the patient.  Patient will be discharged with tramadol, Celebrex, and Flexeril.  I have given him referral to local chiropractor/massage and dry needling specialists.  Discussed home treatment.  I have no concern for myelopathy, radiculopathy, deep space infection of the neck or other emergent abnormality.  Discussed outpatient follow-up and return precautions, work note  given.  Problems Addressed: Acute bilateral thoracic back pain: acute illness or injury Trigger point of shoulder region, left: acute illness or injury   =        Final Clinical Impression(s) / ED Diagnoses Final diagnoses:  None    Rx / DC Orders ED Discharge Orders     None         , PA-C 03/04/22 1800    03/06/22, MD 03/09/22 [phone]

## 2022-03-28 ENCOUNTER — Encounter (HOSPITAL_COMMUNITY): Payer: Self-pay

## 2022-03-28 ENCOUNTER — Emergency Department (HOSPITAL_COMMUNITY)
Admission: EM | Admit: 2022-03-28 | Discharge: 2022-03-29 | Disposition: A | Discharge status: Home / Self Care | Payer: Self-pay | Attending: Emergency Medicine | Admitting: Emergency Medicine

## 2022-03-28 DIAGNOSIS — X500XXA Overexertion from strenuous movement or load, initial encounter: Secondary | ICD-10-CM | POA: Insufficient documentation

## 2022-03-28 DIAGNOSIS — S46812A Strain of other muscles, fascia and tendons at shoulder and upper arm level, left arm, initial encounter: Secondary | ICD-10-CM | POA: Insufficient documentation

## 2022-03-28 NOTE — ED Triage Notes (Signed)
Pt states that he has been having muscle spasms and numbness of his L arm that comes and goes for the past several weeks. No weakness, neuro intact bilaterally

## 2022-03-29 LAB — BASIC METABOLIC PANEL
Anion gap: 8 (ref 5–15)
BUN: 12 mg/dL (ref 6–20)
CO2: 24 mmol/L (ref 22–32)
Calcium: 9 mg/dL (ref 8.9–10.3)
Chloride: 106 mmol/L (ref 98–111)
Creatinine, Ser: 0.94 mg/dL (ref 0.61–1.24)
GFR, Estimated: >60 mL/min (ref >60)
Glucose, Bld: 80 mg/dL (ref 70–99)
Potassium: 3.7 mmol/L (ref 3.5–5.1)
Sodium: 138 mmol/L (ref 135–145)

## 2022-03-29 LAB — CBC
HCT: 39.3 % (ref 39.0–52.0)
Hemoglobin: 13.6 g/dL (ref 13.0–17.0)
MCH: 31.6 pg (ref 26.0–34.0)
MCHC: 34.6 g/dL (ref 30.0–36.0)
MCV: 91.4 fL (ref 80.0–100.0)
Platelets: 318 10*3/uL (ref 150–400)
RBC: 4.3 MIL/uL (ref 4.22–5.81)
RDW: 12.1 % (ref 11.5–15.5)
WBC: 7 10*3/uL (ref 4.0–10.5)
nRBC: 0 % (ref 0.0–0.2)

## 2022-03-29 MED ORDER — LIDOCAINE 5 % EX PTCH
1.0000 | MEDICATED_PATCH | CUTANEOUS | Status: DC
  Administered 2022-03-29: 1 via TRANSDERMAL
  Filled 2022-03-29: qty 1

## 2022-03-29 MED ORDER — CYCLOBENZAPRINE HCL 10 MG PO TABS
10.0000 mg | ORAL_TABLET | Freq: Once | ORAL | Status: AC
Start: 2022-03-29 — End: 2022-03-29
  Administered 2022-03-29: 10 mg via ORAL
  Filled 2022-03-29: qty 1

## 2022-03-29 MED ORDER — CYCLOBENZAPRINE HCL 10 MG PO TABS: 10.0000 mg | ORAL_TABLET | Freq: Two times a day (BID) | ORAL | 0 refills | Status: DC | PRN

## 2022-03-29 NOTE — Discharge Instructions (Addendum)
Please return to the ED with any new symptom such as facial drooping, slurred speech, numbness Please follow-up with PCP for ongoing needs Please read attached informational guide concerning muscle strain Please pick up prescription Flexeril absent in for you.  Please do not drive or operate heavy machinery under the influence of this medication. Please continue using ice packs and heating pads to the left side of your neck for pain and discomfort You may purchase over-the-counter Salonpas patches

## 2022-03-29 NOTE — ED Notes (Signed)
Pt reports L arm numbness and tingling while in the cold. Pt reports feeling fine while outside pain in upper L arm and neck and shoulder area. Numbness intermittently to L 1st finger.

## 2022-03-29 NOTE — ED Provider Notes (Signed)
[facility] EMERGENCY DEPARTMENT Provider Note   CSN: [id number] Arrival date & time: 03/28/22  2250     History  Chief Complaint  Patient presents with   Spasms    Howard Newman is a 41 y.o. male with medical history of abdominal allergies, dental caries.  The patient presents ED for evaluation of left-sided muscle spasms in his neck.  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  Patient reports that 2 to 4 weeks ago, he got off of work and came home and fell asleep on his couch with his head tilted to the left being supported by his left hand.  Patient reports that he spent the entire night sleeping in this position.  Patient states that ever since then, he has had subjective tingling into his left first digit of his hand along with left-sided muscle spasms in the neck.  Patient denies any trauma to the neck.  Patient denies any weakness, facial droop, slurred speech, fevers, chest pain or shortness of breath.    HPI     Home Medications Prior to Admission medications   Medication Sig Start Date End Date Taking? Authorizing Provider  cyclobenzaprine (FLEXERIL) 10 MG tablet Take 1 tablet (10 mg total) by mouth 2 (two) times daily as needed for muscle spasms. 03/29/22  Yes Al Decant, PA-C  acetaminophen (TYLENOL) 500 MG tablet Take 1,000 mg by mouth every 6 (six) hours as needed for moderate pain or headache.    [provider]  amoxicillin-clavulanate (AUGMENTIN) 875-125 MG tablet Take 1 tablet by mouth every 12 (twelve) hours. Patient not taking: Reported on 12/13/2021 09/12/21   Particia Nearing, PA-C  celecoxib (CELEBREX) 200 MG capsule Take 1 capsule (200 mg total) by mouth 2 (two) times daily. 03/04/22   Arthor Captain, PA-C  chlorhexidine (PERIDEX) 0.12 % solution Rinse mouth with 13mL for 30 seconds at bedtime. Patient not taking: Reported on 12/13/2021 05/02/21   Wallis Bamberg, PA-C   chlorhexidine (PERIDEX) 0.12 % solution Use as directed 15 mLs in the mouth or throat 2 (two) times daily. Patient not taking: Reported on 12/13/2021 09/12/21   Particia Nearing, PA-C  clotrimazole-betamethasone (LOTRISONE) cream Apply to affected area 2 times daily prn Patient not taking: Reported on 12/13/2021 09/12/21   Particia Nearing, PA-C  lidocaine (LIDODERM) 5 % Place 1 patch onto the skin daily. Remove & Discard patch within 12 hours or as directed by MD 12/25/21   Nicanor Alcon, April, MD  Lidocaine (SALONPAS PAIN RELIEVING) 4 % PTCH Apply 1 each topically every 12 (twelve) hours. 12/13/21   Kommor, Madison, MD  lidocaine (XYLOCAINE) 2 % solution Use as directed 5 mLs in the mouth or throat as needed for mouth pain. Patient not taking: Reported on 12/13/2021 09/12/21   Particia Nearing, PA-C  methocarbamol (ROBAXIN) 500 MG tablet Take 1 tablet (500 mg total) by mouth 2 (two) times daily. Patient not taking: Reported on 12/13/2021 07/23/21   Garlon Hatchet, PA-C  naproxen (NAPROSYN) 375 MG tablet Take 1 tablet (375 mg total) by mouth 2 (two) times daily. 12/13/21   Kommor, Madison, MD  predniSONE (DELTASONE) 20 MG tablet 2 tabs po daily x 3 days 12/25/21   Palumbo, April, MD  traMADol (ULTRAM) 50 MG tablet Take 1 tablet (50 mg total) by mouth every 6 (six) hours as needed. 03/04/22   Arthor Captain, PA-C  triamcinolone (KENALOG) 0.1 % paste Use as directed 1  application in the mouth or throat 2 (two) times daily. Patient not taking: Reported on 12/13/2021 05/02/21   Wallis Bamberg, PA-C      Allergies    Patient has no known allergies.    Review of Systems   Review of Systems  Constitutional:  Negative for fever.  Musculoskeletal:  Positive for myalgias and neck pain. Negative for neck stiffness.  Neurological:  Positive for numbness. Negative for facial asymmetry, speech difficulty and weakness.  All other systems reviewed and are negative.   Physical Exam Updated Vital Signs BP (!)  129/97   Pulse 78   Temp 98.1 F (36.7 C) (Oral)   Resp 17   SpO2 99%  Physical Exam Vitals and nursing note reviewed.  Constitutional:      General: He is not in acute distress.    Appearance: Normal appearance. He is not ill-appearing, toxic-appearing or diaphoretic.  HENT:     Head: Normocephalic and atraumatic.     Nose: Nose normal. No congestion.     Mouth/Throat:     Mouth: Mucous membranes are moist.     Pharynx: Oropharynx is clear.  Eyes:     Extraocular Movements: Extraocular movements intact.     Conjunctiva/sclera: Conjunctivae normal.     Pupils: Pupils are equal, round, and reactive to light.  Neck:     Comments: Patient has full range of motion actively and passively to the neck.  Patient has left-sided tenderness to palpation of trapezius muscle. Cardiovascular:     Rate and Rhythm: Normal rate and regular rhythm.  Abdominal:     General: Abdomen is flat. Bowel sounds are normal.     Palpations: Abdomen is soft.     Tenderness: There is no abdominal tenderness.  Musculoskeletal:     Cervical back: Normal range of motion and neck supple. No rigidity or tenderness. Muscular tenderness present. No pain with movement. Normal range of motion.  Skin:    General: Skin is warm and dry.     Capillary Refill: Capillary refill takes less than 2 seconds.  Neurological:     Mental Status: He is alert and oriented to person, place, and time.     GCS: GCS eye subscore is 4. GCS verbal subscore is 5. GCS motor subscore is 6.     Cranial Nerves: Cranial nerves 2-12 are intact. No cranial nerve deficit.     Sensory: Sensation is intact. No sensory deficit.     Motor: Motor function is intact. No weakness.     Coordination: Heel to Shin Test normal.     Comments: No focal abnormalities noted on neurological examination.  The patient has no subjective tingling to his left extremity currently.     ED Results / Procedures / Treatments   Labs (all labs ordered are listed, but  only abnormal results are displayed) Labs Reviewed  CBC  BASIC METABOLIC PANEL    EKG None  Radiology No results found.  Procedures Procedures   Medications Ordered in ED Medications  lidocaine (LIDODERM) 5 % 1 patch (1 patch Transdermal Patch Applied 03/29/22 0808)  cyclobenzaprine (FLEXERIL) tablet 10 mg (10 mg Oral Given 03/29/22 0808)    ED Course/ Medical Decision Making/ A&P                           Medical Decision Making Amount and/or Complexity of Data Reviewed Labs: ordered.  Risk Prescription drug management.   41 year old L presents to the  ED for evaluation.  Please see HPI for further details.  On examination, the patient is afebrile and nontachycardic.  The patient lung sounds are clear bilaterally.  The patient abdomen is soft and compressible all 4 quadrants.  The patient neurological examination shows no focal neurodeficits.  Patient has full range of motion of the neck both actively and passively with tenderness over his left-sided trapezius muscle.  Patient labs assessed by me personally and interpreted show no abnormalities.  Patient treated with 10 mg Flexeril, lidocaine patch.  Patient states after these medications were given he feels much better.  Patient requesting discharge at this time.  Patient will be discharged home with prescription for Flexeril, coupon for Lidoderm patches.  Patient advised to follow-up with PCP for further needs.  Patient given return precautions and he voiced understanding.  Patient office questions answered his satisfaction.  Patient stable at this time for discharge home.   Final Clinical Impression(s) / ED Diagnoses Final diagnoses:  Strain of left trapezius muscle, initial encounter    Rx / DC Orders ED Discharge Orders          Ordered    cyclobenzaprine (FLEXERIL) 10 MG tablet  2 times daily PRN        03/29/22 0907              Al Decant, PA-C 03/29/22 1610    Pricilla Loveless,  MD 03/29/22 1048

## 2022-07-23 ENCOUNTER — Encounter (HOSPITAL_COMMUNITY): Payer: Self-pay

## 2022-07-23 ENCOUNTER — Emergency Department (HOSPITAL_COMMUNITY)
Admission: EM | Admit: 2022-07-23 | Discharge: 2022-07-24 | Disposition: A | Discharge status: Home / Self Care | Payer: Self-pay | Attending: Emergency Medicine | Admitting: Emergency Medicine

## 2022-07-23 ENCOUNTER — Other Ambulatory Visit: Payer: Self-pay

## 2022-07-23 DIAGNOSIS — M545 Low back pain, unspecified: Secondary | ICD-10-CM | POA: Insufficient documentation

## 2022-07-23 DIAGNOSIS — K0889 Other specified disorders of teeth and supporting structures: Secondary | ICD-10-CM | POA: Insufficient documentation

## 2022-07-23 DIAGNOSIS — Y990 Civilian activity done for income or pay: Secondary | ICD-10-CM | POA: Insufficient documentation

## 2022-07-23 DIAGNOSIS — X500XXA Overexertion from strenuous movement or load, initial encounter: Secondary | ICD-10-CM | POA: Insufficient documentation

## 2022-07-23 NOTE — ED Triage Notes (Signed)
Pt reports with left lower dental pain and back pain x 1 month.

## 2022-07-24 ENCOUNTER — Encounter (HOSPITAL_COMMUNITY): Payer: Self-pay | Admitting: Student

## 2022-07-24 MED ORDER — NAPROXEN 500 MG PO TABS
500.0000 mg | ORAL_TABLET | Freq: Two times a day (BID) | ORAL | 0 refills | Status: DC | PRN
Start: 2022-07-24 — End: 2023-02-03

## 2022-07-24 MED ORDER — LIDOCAINE 5 % EX PTCH: 1.0000 | MEDICATED_PATCH | Freq: Every day | CUTANEOUS | 0 refills | Status: DC | PRN

## 2022-07-24 MED ORDER — AMOXICILLIN-POT CLAVULANATE 875-125 MG PO TABS: 1.0000 | ORAL_TABLET | Freq: Two times a day (BID) | ORAL | 0 refills | Status: DC

## 2022-07-24 MED ORDER — METHOCARBAMOL 500 MG PO TABS
500.0000 mg | ORAL_TABLET | Freq: Three times a day (TID) | ORAL | 0 refills | Status: DC | PRN
Start: 2022-07-24 — End: 2023-02-03

## 2022-07-24 NOTE — ED Provider Notes (Signed)
[facility]-EMERGENCY DEPT Provider Note   CSN: [id number] Arrival date & time: 07/23/22  1932     History  Chief Complaint  Patient presents with   Dental Pain   Back Pain    Howard Newman is a 40 y.o. male who presents to the ED with complaints of dental pain & back pain.   Dental pain to left lower tooth progressively worsening for the past couple of months. Aggravated by cold air. Feels it is inflamed recently. Denies fever, vomiting, or dysphagia.   Back pain intermittently chronically with heavy lifting/bending for work. No direct recent trauma. Bilateral, worse with movement, no alleviating factors, hx of same. Denies numbness, tingling, weakness, saddle anesthesia, incontinence to bowel/bladder, fever, chills, IV drug use, dysuria, or hx of cancer. Patient has not had prior back surgeries  HPI     Home Medications Prior to Admission medications   Medication Sig Start Date End Date Taking? Authorizing Provider  acetaminophen (TYLENOL) 500 MG tablet Take 1,000 mg by mouth every 6 (six) hours as needed for moderate pain or headache.    [provider]  amoxicillin-clavulanate (AUGMENTIN) 875-125 MG tablet Take 1 tablet by mouth every 12 (twelve) hours. Patient not taking: Reported on 12/13/2021 09/12/21   Particia Nearing, PA-C  celecoxib (CELEBREX) 200 MG capsule Take 1 capsule (200 mg total) by mouth 2 (two) times daily. 03/04/22   Arthor Captain, PA-C  chlorhexidine (PERIDEX) 0.12 % solution Rinse mouth with 70mL for 30 seconds at bedtime. Patient not taking: Reported on 12/13/2021 05/02/21   Wallis Bamberg, PA-C  chlorhexidine (PERIDEX) 0.12 % solution Use as directed 15 mLs in the mouth or throat 2 (two) times daily. Patient not taking: Reported on 12/13/2021 09/12/21   Particia Nearing, PA-C  clotrimazole-betamethasone (LOTRISONE) cream Apply to affected area 2 times daily prn Patient not taking: Reported on 12/13/2021 09/12/21   Particia Nearing, PA-C  cyclobenzaprine (FLEXERIL) 10 MG tablet Take 1 tablet (10 mg total) by mouth 2 (two) times daily as needed for muscle spasms. 03/29/22   Al Decant, PA-C  lidocaine (LIDODERM) 5 % Place 1 patch onto the skin daily. Remove & Discard patch within 12 hours or as directed by MD 12/25/21   Nicanor Alcon, April, MD  Lidocaine (SALONPAS PAIN RELIEVING) 4 % PTCH Apply 1 each topically every 12 (twelve) hours. 12/13/21   Kommor, Madison, MD  lidocaine (XYLOCAINE) 2 % solution Use as directed 5 mLs in the mouth or throat as needed for mouth pain. Patient not taking: Reported on 12/13/2021 09/12/21   Particia Nearing, PA-C  methocarbamol (ROBAXIN) 500 MG tablet Take 1 tablet (500 mg total) by mouth 2 (two) times daily. Patient not taking: Reported on 12/13/2021 07/23/21   Garlon Hatchet, PA-C  naproxen (NAPROSYN) 375 MG tablet Take 1 tablet (375 mg total) by mouth 2 (two) times daily. 12/13/21   Kommor, Madison, MD  predniSONE (DELTASONE) 20 MG tablet 2 tabs po daily x 3 days 12/25/21   Palumbo, April, MD  traMADol (ULTRAM) 50 MG tablet Take 1 tablet (50 mg total) by mouth every 6 (six) hours as needed. 03/04/22   Arthor Captain, PA-C  triamcinolone (KENALOG) 0.1 % paste Use as directed 1 application in the mouth or throat 2 (two) times daily. Patient not taking: Reported on 12/13/2021 05/02/21   Wallis Bamberg, PA-C      Allergies    Patient has no known allergies.    Review of Systems  Review of Systems  Constitutional:  Negative for chills, fever and unexpected weight change.  HENT:  Positive for dental problem. Negative for trouble swallowing.   Gastrointestinal:  Negative for abdominal pain, nausea and vomiting.  Genitourinary:  Negative for dysuria.  Musculoskeletal:  Positive for back pain.  Neurological:  Negative for weakness and numbness.       Negative for saddle anesthesia or bowel/bladder incontinence.   All other systems reviewed and are negative.   Physical  Exam Updated Vital Signs BP 129/86 (BP Location: Left Arm)   Pulse 83   Temp 98.3 F (36.8 C) (Oral)   Resp 16   Ht 5\' 11"  (1.803 m)   Wt 89.8 kg   SpO2 98%   BMI 27.62 kg/m  Physical Exam Vitals and nursing note reviewed.  Constitutional:      General: He is not in acute distress.    Appearance: He is well-developed. He is not toxic-appearing.  HENT:     Head: Normocephalic and atraumatic.     Right Ear: Tympanic membrane is not perforated, erythematous, retracted or bulging.     Left Ear: Tympanic membrane is not perforated, erythematous, retracted or bulging.     Nose: Nose normal.     Mouth/Throat:     Pharynx: Uvula midline. No oropharyngeal exudate or posterior oropharyngeal erythema.      Comments: Posterior oropharynx is symmetric appearing. Patient tolerating own secretions without difficulty. No trismus. No drooling. No hot potato voice. No swelling beneath the tongue, submandibular compartment is soft.    Eyes:     General:        Right eye: No discharge.        Left eye: No discharge.     Conjunctiva/sclera: Conjunctivae normal.  Abdominal:     General: There is no distension.     Palpations: Abdomen is soft.     Tenderness: There is no abdominal tenderness.  Musculoskeletal:     Cervical back: Normal range of motion and neck supple. No edema or erythema. No muscular tenderness.     Comments: No obvious deformity, appreciable swelling, erythema, ecchymosis, significant open wounds, or increased warmth.  Back: No point/focal vertebral tenderness, no palpable step off or crepitus. Bilateral lumbar paraspinal muscle tenderness Lower extremities: ranging @ all major joints. No focal bony tenderness.   Lymphadenopathy:     Cervical: No cervical adenopathy.  Skin:    General: Skin is warm and dry.     Findings: No rash.  Neurological:     Mental Status: He is alert.     Comments: Sensation grossly intact to bilateral lower extremities. 5/5 symmetric strength  with plantar/dorsiflexion bilaterally. Gait is intact without obvious foot drop.   Psychiatric:        Behavior: Behavior normal.        Thought Content: Thought content normal.     ED Results / Procedures / Treatments   Labs (all labs ordered are listed, but only abnormal results are displayed) Labs Reviewed - No data to display  EKG None  Radiology No results found.  Procedures Procedures    Medications Ordered in ED Medications - No data to display  ED Course/ Medical Decision Making/ A&P                           Medical Decision Making Risk Prescription drug management.   Patient presents to the ED with dental pain & back pain. Nontoxic, vitals WNL.  Chart/nursing notes reviewed.   Dental pain- no abscess, exam not consistent w/ ludwigs angina or deep space infection @ this time. Will tx/ w augmentin & NSAID as below. Dental resources will be provided.   Back pain- Patient has no back pain red flags.  No neurologic deficits, ambulatory- doubt cauda equina syndrome or acute cord compression. Afebrile, no hx of IVDU- doubt epidural abscess. No urinary sxs- feel that UTI/nephrolithiasis. Symmetric pulses, not a tearing sensation, overall well appearing, feel that dissection is unlikely at this time. Favor musculoskeletal pain- likely strain/spasm.   Will treat Naproxen and Robaxin, discussed with patient that they are not to drive or operate heavy machinery while taking Robaxin. I discussed treatment plan, need for PCP follow-up, and return precautions with the patient. Provided opportunity for questions, patient confirmed understanding and is in agreement with plan.   Portions of this note were generated with [employer]. Dictation errors may occur despite best attempts at proofreading.   Final Clinical Impression(s) / ED Diagnoses Final diagnoses:  Bilateral low back pain without sciatica, unspecified chronicity  Pain, dental    Rx / DC  Orders ED Discharge Orders          Ordered    amoxicillin-clavulanate (AUGMENTIN) 875-125 MG tablet  Every 12 hours        07/24/22 0222    naproxen (NAPROSYN) 500 MG tablet  2 times daily PRN        07/24/22 0222    methocarbamol (ROBAXIN) 500 MG tablet  Every 8 hours PRN        07/24/22 0222    lidocaine (LIDODERM) 5 %  Daily PRN        07/24/22 [address] [place], PA-C 34/74/25 9563    Delora Fuel, MD 87/56/43 [id number]

## 2022-07-24 NOTE — Discharge Instructions (Addendum)
You were seen in the emergency department for back pain and dental pain today.   Call one of the dentists offices provided to schedule an appointment for re-evaluation and further management within the next 48 hours.   We have prescribed you Augmentin which is an antibiotic to treat for dental infection.   We are sending you home with the following medications to help with your dental and back pain.  - Naproxen- this is a nonsteroidal anti-inflammatory medication that will help with pain and swelling. Be sure to take this medication as prescribed with food, 1 pill every 12 hours,  It should be taken with food, as it can cause stomach upset, and more seriously, stomach bleeding. Do not take other nonsteroidal anti-inflammatory medications with this such as Advil, Motrin, Aleve, Mobic, Goodie Powder, or Motrin etc..    - Robaxin- this is the muscle relaxer I have prescribed, this is meant to help with muscle tightness/spasms. Be aware that this medication may make you drowsy therefore the first time you take this it should be at a time you are in an environment where you can rest. Do not drive or operate heavy machinery when taking this medication. Do not drink alcohol or take other sedating medications with this medicine such as narcotics or benzodiazepines.   - Lidoderm patch- Apply 1 patch to your area of most significant area of back pain once per day to help numb/soothe this area. Remove & discard patch within 12 hours of application. Do not apply heat over the patch.   You make take Tylenol per over the counter dosing with these medications.   We have prescribed you new medication(s) today. Discuss the medications prescribed today with your pharmacist as they can have adverse effects and interactions with your other medicines including over the counter and prescribed medications. Seek medical evaluation if you start to experience new or abnormal symptoms after taking one of these medicines, seek  care immediately if you start to experience difficulty breathing, feeling of your throat closing, facial swelling, or rash as these could be indications of a more serious allergic reaction  Please follow-up with primary care within 1 week for reevaluation.  Return to the ER for any new or worsening symptoms including but not limited to new or worsening pain, fever, numbness/weakness, loss of control bowel or bladder function, inability to walk, trouble opening your mouth, trouble swallowing, trouble with neck movement, change in your voice, or any other concerns.

## 2023-02-03 ENCOUNTER — Ambulatory Visit (HOSPITAL_COMMUNITY)
Admission: EM | Admit: 2023-02-03 | Discharge: 2023-02-03 | Disposition: A | Discharge status: Home / Self Care | Payer: Self-pay

## 2023-02-03 ENCOUNTER — Encounter (HOSPITAL_COMMUNITY): Payer: Self-pay

## 2023-02-03 DIAGNOSIS — Z711 Person with feared health complaint in whom no diagnosis is made: Secondary | ICD-10-CM

## 2023-02-03 NOTE — ED Triage Notes (Signed)
Patient states his relative, 42 years old died following a heart attack. Patient states he is anxious and depressed. Patient states, "I want to do a reality check on myself because I am 41."  Patient states when he bent over he had a funny feeling in his chest 2 days ago. Patient states he was looking at his pictures and sitting alone he became anxious.

## 2023-02-03 NOTE — Discharge Instructions (Signed)
I have set you up with a primary care provider. They will be able to perform a physical and blood work, and will be a Chief Technology Officer for keeping up with your health.  You can also see the behavioral health urgent care at any time.  They are open 24/7.  You are experiencing grief which is very valid but it can be helpful to speak with a professional.  If you have any acute concerns you can always return to the urgent care.

## 2023-02-03 NOTE — ED Provider Notes (Signed)
[facility]    CSN: [id number] Arrival date & time: 02/03/23  1536     History   Chief Complaint Chief Complaint  Patient presents with   Anxiety    HPI Howard Newman is a 42 y.o. male.  2 days ago he had an episode of chest discomfort when bending over.  It lasted less than a minute and resolved on its own.  He is not currently having any chest pain or shortness of breath.  He was concerned because his cousin died recently following a heart attack.  He has been experiencing grief about the event as cousin was only 65. Patient wants to get his health in check and have a full exam and physical today.  Has not tried any medicines, no cardiac history, does not have a PCP  Past Medical History:  Diagnosis Date   Dental disease    Environmental allergies     There are no problems to display for this patient.   Past Surgical History:  Procedure Laterality Date   MOUTH SURGERY     OPEN REDUCTION INTERNAL FIXATION (ORIF) PROXIMAL PHALANX Left 12/29/2020   Procedure: Open treatment of left small finger proximal phalanx fracture;  Surgeon: Mack Hook, MD;  Location: [facility];  Service: Orthopedics;  Laterality: Left;  with preop regional block       Home Medications    Prior to Admission medications   Medication Sig Start Date End Date Taking? Authorizing Provider  acetaminophen (TYLENOL) 500 MG tablet Take 1,000 mg by mouth every 6 (six) hours as needed for moderate pain or headache.    [provider]    Family History Family History  Problem Relation Age of Onset   Heart failure Mother    Diabetes Mother    Cancer Mother    Healthy Father     Social History Social History   Tobacco Use   Smoking status: Some Days    Packs/day: 0    Types: Cigars, Cigarettes   Smokeless tobacco: Never  Vaping Use   Vaping Use: Some days   Substances: Nicotine, Flavoring  Substance Use Topics   Alcohol use: Yes   Drug use: Yes     Types: Marijuana     Allergies   Patient has no known allergies.   Review of Systems Review of Systems As per HPI  Physical Exam Triage Vital Signs ED Triage Vitals  Enc Vitals Group     BP 02/03/23 1602 126/86     Pulse Rate 02/03/23 1602 81     Resp 02/03/23 1602 14     Temp 02/03/23 1602 98.5 F (36.9 C)     Temp Source 02/03/23 1602 Oral     SpO2 02/03/23 1602 97 %     Weight --      Height --      Head Circumference --      Peak Flow --      Pain Score 02/03/23 1603 0     Pain Loc --      Pain Edu? --      Excl. in GC? --    No data found.  Updated Vital Signs BP 126/86 (BP Location: Left Arm)   Pulse 81   Temp 98.5 F (36.9 C) (Oral)   Resp 14   SpO2 97%    Physical Exam Vitals and nursing note reviewed.  Constitutional:      General: He is not in acute distress.  Appearance: Normal appearance. He is not ill-appearing or diaphoretic.  HENT:     Mouth/Throat:     Pharynx: Oropharynx is clear.  Cardiovascular:     Rate and Rhythm: Normal rate and regular rhythm.     Pulses: Normal pulses.     Heart sounds: Normal heart sounds.  Pulmonary:     Effort: Pulmonary effort is normal. No respiratory distress.     Breath sounds: Normal breath sounds.  Chest:     Chest wall: No tenderness.  Skin:    General: Skin is warm and dry.  Neurological:     Mental Status: He is alert and oriented to person, place, and time.     UC Treatments / Results  Labs (all labs ordered are listed, but only abnormal results are displayed) Labs Reviewed - No data to display  EKG  Radiology No results found.  Procedures Procedures   Medications Ordered in UC Medications - No data to display  Initial Impression / Assessment and Plan / UC Course  I have reviewed the triage vital signs and the nursing notes.  Pertinent labs & imaging results that were available during my care of the patient were reviewed by me and considered in my medical decision making (see  chart for details).  Patient was unaware this was an urgent care and not a primary care facility.  After discussion understands we do not do physicals here.  However he is well-appearing and his brief physical exam is normal.  Reassurance provided to patient that his episode of chest discomfort only happened with movement and resolved on its own.  I have set him up with a primary care for 5/9, 2 weeks from today.  He understands they will be a good resource for him to have a full health evaluation, and to follow along long-term.  Have also provided him with behavioral health urgent care info, recommend to see them regarding his grief reaction and any underlying anxiety or feelings of depression.  His vitals are stable and he is well-appearing.  Discussed he can return to the urgent care anytime for any acute concerns or if he develops chest pain that continues.  Final Clinical Impressions(s) / UC Diagnoses   Final diagnoses:  Worried well     Discharge Instructions      I have set you up with a primary care provider. They will be able to perform a physical and blood work, and will be a Chief Technology Officer for keeping up with your health.  You can also see the behavioral health urgent care at any time.  They are open 24/7.  You are experiencing grief which is very valid but it can be helpful to speak with a professional.  If you have any acute concerns you can always return to the urgent care.    ED Prescriptions   None    PDMP not reviewed this encounter.   Bethsaida Siegenthaler, Ray Church 02/03/23 2052

## 2023-02-17 ENCOUNTER — Ambulatory Visit: Payer: Self-pay | Admitting: Internal Medicine

## 2023-03-22 ENCOUNTER — Ambulatory Visit (INDEPENDENT_AMBULATORY_CARE_PROVIDER_SITE_OTHER): Payer: Self-pay | Admitting: Internal Medicine

## 2023-03-22 ENCOUNTER — Encounter: Payer: Self-pay | Admitting: Internal Medicine

## 2023-03-22 VITALS — BP 120/84 | HR 82 | Temp 98.3°F | Ht 71.0 in | Wt 193.3 lb

## 2023-03-22 DIAGNOSIS — Z114 Encounter for screening for human immunodeficiency virus [HIV]: Secondary | ICD-10-CM

## 2023-03-22 DIAGNOSIS — Z0000 Encounter for general adult medical examination without abnormal findings: Secondary | ICD-10-CM

## 2023-03-22 DIAGNOSIS — Z1159 Encounter for screening for other viral diseases: Secondary | ICD-10-CM

## 2023-03-22 DIAGNOSIS — Z8249 Family history of ischemic heart disease and other diseases of the circulatory system: Secondary | ICD-10-CM

## 2023-03-22 DIAGNOSIS — Z23 Encounter for immunization: Secondary | ICD-10-CM

## 2023-03-22 DIAGNOSIS — F17210 Nicotine dependence, cigarettes, uncomplicated: Secondary | ICD-10-CM

## 2023-03-22 NOTE — Progress Notes (Signed)
Established Patient Office Visit     CC/Reason for Visit: Establish care, annual preventive exam  HPI: Howard Newman is a 42 y.o. male who is coming in today for the above mentioned reasons. No PMH of significance. His 36 y/o cousin just died from a heart attack and this has prompted him to seek medical attention. No medical care in years. Was a smoker of 1 PPD for over 25 years but quit 2 months ago after his cousin's heart attack, drinks 2-3 beers a day. His mother, several maternal aunts and and his maternal cousin have had early MIs. NKDAs, no PSH.   Past Medical/Surgical History: Past Medical History:  Diagnosis Date   Dental disease    Environmental allergies     Past Surgical History:  Procedure Laterality Date   MOUTH SURGERY     OPEN REDUCTION INTERNAL FIXATION (ORIF) PROXIMAL PHALANX Left 12/29/2020   Procedure: Open treatment of left small finger proximal phalanx fracture;  Surgeon: Mack Hook, MD;  Location: [facility];  Service: Orthopedics;  Laterality: Left;  with preop regional block    Social History:  reports that he has been smoking cigars and cigarettes. He has never used smokeless tobacco. He reports current alcohol use. He reports current drug use. Drug: Marijuana.  Allergies: No Known Allergies  Family History:  Family History  Problem Relation Age of Onset   Heart failure Mother    Diabetes Mother    Cancer Mother    Healthy Father      Current Outpatient Medications:    acetaminophen (TYLENOL) 500 MG tablet, Take 1,000 mg by mouth every 6 (six) hours as needed for moderate pain or headache., Disp: , Rfl:   Review of Systems:  Negative unless indicated in HPI.   Physical Exam: Vitals:   03/22/23 1525  BP: 120/84  Pulse: 82  Temp: 98.3 F (36.8 C)  TempSrc: Oral  SpO2: 98%  Weight: 193 lb 4.8 oz (87.7 kg)  Height: 5\' 11"  (1.803 m)    Body mass index is 26.96 kg/m.   Physical Exam Vitals reviewed.   Constitutional:      General: He is not in acute distress.    Appearance: Normal appearance. He is not ill-appearing, toxic-appearing or diaphoretic.  HENT:     Head: Normocephalic.     Right Ear: Tympanic membrane, ear canal and external ear normal. There is no impacted cerumen.     Left Ear: Tympanic membrane, ear canal and external ear normal. There is no impacted cerumen.     Nose: Nose normal.     Mouth/Throat:     Mouth: Mucous membranes are moist.     Pharynx: Oropharynx is clear. No oropharyngeal exudate or posterior oropharyngeal erythema.  Eyes:     General: No scleral icterus.       Right eye: No discharge.        Left eye: No discharge.     Conjunctiva/sclera: Conjunctivae normal.     Pupils: Pupils are equal, round, and reactive to light.  Neck:     Vascular: No carotid bruit.  Cardiovascular:     Rate and Rhythm: Normal rate and regular rhythm.     Pulses: Normal pulses.     Heart sounds: Normal heart sounds.  Pulmonary:     Effort: Pulmonary effort is normal. No respiratory distress.     Breath sounds: Normal breath sounds.  Abdominal:     General: Abdomen is flat. Bowel sounds are  normal.     Palpations: Abdomen is soft.  Musculoskeletal:        General: Normal range of motion.     Cervical back: Normal range of motion.  Skin:    General: Skin is warm and dry.  Neurological:     General: No focal deficit present.     Mental Status: He is alert and oriented to person, place, and time. Mental status is at baseline.  Psychiatric:        Mood and Affect: Mood normal.        Behavior: Behavior normal.        Thought Content: Thought content normal.        Judgment: Judgment normal.      Impression and Plan:  Encounter for preventive health examination -     PSA; Future  Cigarette nicotine dependence without complication  Encounter for hepatitis C screening test for low risk patient -     Hepatitis C antibody; Future  Encounter for screening for  HIV -     HIV Antibody (routine testing w rflx); Future  Immunization due  Family history of early CAD -     CBC with Differential/Platelet; Future -     Comprehensive metabolic panel; Future -     Hemoglobin A1c; Future -     Lipid panel; Future -     Ambulatory referral to Cardiology  Need for diphtheria-tetanus-pertussis (Tdap) vaccine -     Tdap vaccine greater than or equal to 7yo IM    -Recommend routine eye and dental care. -Healthy lifestyle discussed in detail. -Labs to be updated today. -Prostate cancer screening: PSA today Health Maintenance  Topic Date Due   COVID-19 Vaccine (1) Never done   HIV Screening  Never done   Hepatitis C Screening  Never done   Flu Shot  05/12/2023   DTaP/Tdap/Td vaccine (2 - Td or Tdap) 03/21/2033   HPV Vaccine  Aged Out    -Tdap in office today. -Referral to cardiology will be placed due to strong family history of coronary artery disease    Olander Friedl Philip Aspen, MD [facility]

## 2023-03-23 LAB — CBC WITH DIFFERENTIAL/PLATELET
Basophils Absolute: 0.1 10*3/uL (ref 0.0–0.1)
Basophils Relative: 1.7 % (ref 0.0–3.0)
Eosinophils Absolute: 0.2 10*3/uL (ref 0.0–0.7)
Eosinophils Relative: 4 % (ref 0.0–5.0)
HCT: 42.3 % (ref 39.0–52.0)
Hemoglobin: 13.6 g/dL (ref 13.0–17.0)
Lymphocytes Relative: 37 % (ref 12.0–46.0)
Lymphs Abs: 1.6 10*3/uL (ref 0.7–4.0)
MCHC: 32.3 g/dL (ref 30.0–36.0)
MCV: 94 fl (ref 78.0–100.0)
Monocytes Absolute: 0.4 10*3/uL (ref 0.1–1.0)
Monocytes Relative: 8.4 % (ref 3.0–12.0)
Neutro Abs: 2.1 10*3/uL (ref 1.4–7.7)
Neutrophils Relative %: 48.9 % (ref 43.0–77.0)
Platelets: 316 10*3/uL (ref 150.0–400.0)
RBC: 4.5 Mil/uL (ref 4.22–5.81)
RDW: 14.1 % (ref 11.5–15.5)
WBC: 4.4 10*3/uL (ref 4.0–10.5)

## 2023-03-23 LAB — COMPREHENSIVE METABOLIC PANEL
ALT: 19 U/L (ref 0–53)
AST: 26 U/L (ref 0–37)
Albumin: 4.3 g/dL (ref 3.5–5.2)
Alkaline Phosphatase: 48 U/L (ref 39–117)
BUN: 10 mg/dL (ref 6–23)
CO2: 27 mEq/L (ref 19–32)
Calcium: 9.3 mg/dL (ref 8.4–10.5)
Chloride: 105 mEq/L (ref 96–112)
Creatinine, Ser: 0.98 mg/dL (ref 0.40–1.50)
GFR: 95.76 mL/min (ref >60.00)
Glucose, Bld: 83 mg/dL (ref 70–99)
Potassium: 4.3 mEq/L (ref 3.5–5.1)
Sodium: 141 mEq/L (ref 135–145)
Total Bilirubin: 0.7 mg/dL (ref 0.2–1.2)
Total Protein: 7.8 g/dL (ref 6.0–8.3)

## 2023-03-23 LAB — LIPID PANEL
Cholesterol: 183 mg/dL (ref 0–200)
HDL: 71.3 mg/dL (ref >39.00)
LDL Cholesterol: 99 mg/dL (ref 0–99)
NonHDL: 111.34
Total CHOL/HDL Ratio: 3
Triglycerides: 62 mg/dL (ref 0.0–149.0)
VLDL: 12.4 mg/dL (ref 0.0–40.0)

## 2023-03-23 LAB — HEMOGLOBIN A1C: Hgb A1c MFr Bld: 4.9 % (ref 4.6–6.5)

## 2023-03-23 LAB — HEPATITIS C ANTIBODY: Hepatitis C Ab: NONREACTIVE

## 2023-03-23 LAB — HIV ANTIBODY (ROUTINE TESTING W REFLEX): HIV 1&2 Ab, 4th Generation: NONREACTIVE

## 2023-03-23 LAB — PSA: PSA: 0.41 ng/mL (ref 0.10–4.00)

## 2023-03-28 ENCOUNTER — Telehealth: Payer: Self-pay | Admitting: Internal Medicine

## 2023-03-28 NOTE — Telephone Encounter (Signed)
Pt had bloodwork done, interesting in knowing his blood type. [facility] provider said she would give him a follow up call

## 2023-03-29 NOTE — Telephone Encounter (Signed)
Spoke with Clydie Braun at the lab and we do not do that test. Patient is aware.

## 2023-05-12 NOTE — Progress Notes (Deleted)
Cardiology Office Note:   Date:  05/12/2023  NAME:  Howard Newman    MRN: [id number] DOB:  February 01, 1981   PCP:  Philip Aspen, Limmie Patricia, MD  Cardiologist:  None  Electrophysiologist:  None   Referring MD: Philip Aspen, Estel*   No chief complaint on file.   History of Present Illness:   Howard Newman is a 42 y.o. male with a hx of tobacco abuse who is being seen today for the evaluation of family history of heart disease at the request of Philip Aspen, Estela Y, MD.  A1c 4.9 T chol 183, HDL 71, LDL 99, TG 62  ***  Past Medical History: Past Medical History:  Diagnosis Date   Dental disease    Environmental allergies     Past Surgical History: Past Surgical History:  Procedure Laterality Date   MOUTH SURGERY     OPEN REDUCTION INTERNAL FIXATION (ORIF) PROXIMAL PHALANX Left 12/29/2020   Procedure: Open treatment of left small finger proximal phalanx fracture;  Surgeon: Mack Hook, MD;  [facility];  Service: Orthopedics;  Laterality: Left;  with preop regional block    Current Medications: No outpatient medications have been marked as taking for the 05/13/23 encounter (Appointment) with O'Neal, Ronnald Ramp, MD.     Allergies:    Patient has no known allergies.   Social History: Social History   Socioeconomic History   Marital status: Single    Spouse name: Government social research officer   Number of children: Not on file   Years of education: Not on file   Highest education level: Not on file  Occupational History   Not on file  Tobacco Use   Smoking status: Some Days    Types: Cigars, Cigarettes   Smokeless tobacco: Never  Vaping Use   Vaping status: Some Days   Substances: Nicotine, Flavoring  Substance and Sexual Activity   Alcohol use: Yes   Drug use: Yes    Types: Marijuana   Sexual activity: Yes  Other Topics Concern   Not on file  Social History Narrative   Not on file   Social Determinants of Health   Financial  Resource Strain: Not on file  Food Insecurity: Not on file  Transportation Needs: Not on file  Physical Activity: Not on file  Stress: Not on file  Social Connections: Not on file     Family History: The patient's family history includes Cancer in his mother; Diabetes in his mother; Healthy in his father; Heart failure in his mother.  ROS:   All other ROS reviewed and negative. Pertinent positives noted in the HPI.     EKGs/Labs/Other Studies Reviewed:   The following studies were personally reviewed by me today:  EKG:  EKG is *** ordered today.        Recent Labs: 03/22/2023: ALT 19; BUN 10; Creatinine, Ser 0.98; Hemoglobin 13.6; Platelets 316.0; Potassium 4.3; Sodium 141   Recent Lipid Panel    Component Value Date/Time   CHOL 183 03/22/2023 1611   TRIG 62.0 03/22/2023 1611   HDL 71.30 03/22/2023 1611   CHOLHDL 3 03/22/2023 1611   VLDL 12.4 03/22/2023 1611   LDLCALC 99 03/22/2023 1611    Physical Exam:   VS:  There were no vitals taken for this visit.   Wt Readings from Last 3 Encounters:  03/22/23 193 lb 4.8 oz (87.7 kg)  07/23/22 198 lb (89.8 kg)  03/04/22 192 lb (87.1 kg)    General: Well nourished, well  developed, in no acute distress Head: Atraumatic, normal size  Eyes: PEERLA, EOMI  Neck: Supple, no JVD Endocrine: No thryomegaly Cardiac: Normal S1, S2; RRR; no murmurs, rubs, or gallops Lungs: Clear to auscultation bilaterally, no wheezing, rhonchi or rales  Abd: Soft, nontender, no hepatomegaly  Ext: No edema, pulses 2+ Musculoskeletal: No deformities, BUE and BLE strength normal and equal Skin: Warm and dry, no rashes   Neuro: Alert and oriented to person, place, time, and situation, CNII-XII grossly intact, no focal deficits  Psych: Normal mood and affect   ASSESSMENT:   Howard Newman is a 42 y.o. male who presents for the following: No diagnosis found.  PLAN:   There are no diagnoses linked to this encounter.  {Are you ordering a CV  Procedure (e.g. stress test, cath, DCCV, TEE, etc)?   Press F2        :[id number]}  Disposition: No follow-ups on file.  Medication Adjustments/Labs and Tests Ordered: Current medicines are reviewed at length with the patient today.  Concerns regarding medicines are outlined above.  No orders of the defined types were placed in this encounter.  No orders of the defined types were placed in this encounter.  There are no Patient Instructions on file for this visit.   Time Spent with Patient: I have spent a total of *** minutes with patient reviewing hospital notes, telemetry, EKGs, labs and examining the patient as well as establishing an assessment and plan that was discussed with the patient.  > 50% of time was spent in direct patient care.  Signed, Lenna Gilford. Flora Lipps, MD, [facility]  [facility]  [address] [place], [place] [address] [phone]  05/12/2023 10:12 PM

## 2023-05-13 ENCOUNTER — Ambulatory Visit: Payer: Self-pay | Admitting: Cardiovascular Disease

## 2023-05-13 DIAGNOSIS — Z8249 Family history of ischemic heart disease and other diseases of the circulatory system: Secondary | ICD-10-CM

## 2023-08-07 NOTE — Progress Notes (Deleted)
  Cardiology Office Note:  .   Date:  08/07/2023  ID:  Howard Newman, DOB 09/18/1981, MRN [id number] PCP: Howard Newman, Howard Patricia, MD  [facility] Providers Cardiologist:  None { Click to update primary MD,subspecialty MD or APP then REFRESH:1}   History of Present Illness: .   Howard Newman is a 42 Newman.o. male with history of tobacco abuse who presents for the evaluation of family history of heart disease at the request of Howard Newman, Howard Y, MD.  T chol 183, HDL 71, LDL 99, TG 62     Problem List Tobacco abuse     ROS: All other ROS reviewed and negative. Pertinent positives noted in the HPI.     Studies Reviewed: Marland Kitchen       Physical Exam:   VS:  There were no vitals taken for this visit.   Wt Readings from Last 3 Encounters:  03/22/23 193 lb 4.8 oz (87.7 kg)  07/23/22 198 lb (89.8 kg)  03/04/22 192 lb (87.1 kg)    GEN: Well nourished, well developed in no acute distress NECK: No JVD; No carotid bruits CARDIAC: ***RRR, no murmurs, rubs, gallops RESPIRATORY:  Clear to auscultation without rales, wheezing or rhonchi  ABDOMEN: Soft, non-tender, non-distended EXTREMITIES:  No edema; No deformity  ASSESSMENT AND PLAN: .   ***    {Are you ordering a CV Procedure (e.g. stress test, cath, DCCV, TEE, etc)?   Press F2        :[id number]}   Follow-up: No follow-ups on file.  Time Spent with Patient: I have spent a total of *** minutes with patient reviewing hospital notes, telemetry, EKGs, labs and examining the patient as well as establishing an assessment and plan that was discussed with the patient.  > 50% of time was spent in direct patient care.  Signed, Lenna Gilford. Flora Lipps, MD, [facility]  [facility]  [address] [place], [place] [address] [phone]  1:58 PM

## 2023-08-09 ENCOUNTER — Ambulatory Visit: Payer: Self-pay | Attending: Cardiovascular Disease | Admitting: Cardiovascular Disease

## 2023-08-09 DIAGNOSIS — Z8249 Family history of ischemic heart disease and other diseases of the circulatory system: Secondary | ICD-10-CM

## 2023-08-10 ENCOUNTER — Ambulatory Visit (HOSPITAL_COMMUNITY)
Admission: EM | Admit: 2023-08-10 | Discharge: 2023-08-10 | Disposition: A | Discharge status: Home / Self Care | Payer: Self-pay | Attending: Family Medicine | Admitting: Family Medicine

## 2023-08-10 ENCOUNTER — Encounter (HOSPITAL_COMMUNITY): Payer: Self-pay | Admitting: Emergency Medicine

## 2023-08-10 ENCOUNTER — Encounter: Payer: Self-pay | Admitting: Cardiovascular Disease

## 2023-08-10 DIAGNOSIS — K0889 Other specified disorders of teeth and supporting structures: Secondary | ICD-10-CM

## 2023-08-10 MED ORDER — IBUPROFEN 800 MG PO TABS: 800.0000 mg | ORAL_TABLET | Freq: Three times a day (TID) | ORAL | 0 refills | Status: DC

## 2023-08-10 MED ORDER — AMOXICILLIN 875 MG PO TABS: 875.0000 mg | ORAL_TABLET | Freq: Two times a day (BID) | ORAL | 0 refills | Status: AC

## 2023-08-10 NOTE — ED Triage Notes (Signed)
Pt c/o dental pain on top right and bottom left for a few weeks. States he it feels infected and is made worse at his new job because he works in [employer].

## 2023-08-13 NOTE — ED Provider Notes (Signed)
[facility]   [id number] 08/10/23 Arrival Time: 1902  ASSESSMENT & PLAN:  1. Pain, dental    No sign of abscess requiring I&D at this time. Discussed. Begin:e Meds ordered this encounter  Medications   ibuprofen (ADVIL) 800 MG tablet    Sig: Take 1 tablet (800 mg total) by mouth 3 (three) times daily with meals.    Dispense:  21 tablet    Refill:  0   amoxicillin (AMOXIL) 875 MG tablet    Sig: Take 1 tablet (875 mg total) by mouth 2 (two) times daily for 10 days.    Dispense:  20 tablet    Refill:  0   Has dentist with whom he can schedule f/u.  Reviewed expectations re: course of current medical issues. Questions answered. Outlined signs and symptoms indicating need for more acute intervention. Patient verbalized understanding. After Visit Summary given.   SUBJECTIVE:  Howard Newman is a 42 y.o. male who reports gradual onset of left lower dental pain described as aching/throbbing; with cold sensitivity. Present for sev days. Fever: absent. Tolerating PO intake but reports pain with chewing. Normal swallowing. He does not see a dentist regularly. No neck swelling or pain. OTC analgesics without relief.  ROS: As per HPI.  OBJECTIVE: Vitals:   08/10/23 1947  BP: 123/79  Pulse: 84  Temp: 97.6 F (36.4 C)  TempSrc: Oral  SpO2: 94%    General appearance: alert; no distress HENT: normocephalic; atraumatic; dentition: poor; left lower gum without areas of fluctuance, drainage, or bleeding and with tenderness to palpation; normal jaw movement without difficulty Neck: supple without LAD; FROM; trachea midline Lungs: normal respirations; unlabored; speaks full sentences without difficulty Skin: warm and dry Psychological: alert and cooperative; normal mood and affect  No Known Allergies  Past Medical History:  Diagnosis Date   Dental disease    Environmental allergies    Social History   Socioeconomic History   Marital status: Single    Spouse  name: Delana Meyer   Number of children: Not on file   Years of education: Not on file   Highest education level: Not on file  Occupational History   Not on file  Tobacco Use   Smoking status: Some Days    Types: Cigars, Cigarettes   Smokeless tobacco: Never  Vaping Use   Vaping status: Some Days   Substances: Nicotine, Flavoring  Substance and Sexual Activity   Alcohol use: Yes   Drug use: Yes    Types: Marijuana   Sexual activity: Yes  Other Topics Concern   Not on file  Social History Narrative   Not on file   Social Determinants of Health   Financial Resource Strain: Not on file  Food Insecurity: Not on file  Transportation Needs: Not on file  Physical Activity: Not on file  Stress: Not on file  Social Connections: Not on file  Intimate Partner Violence: Not on file   Family History  Problem Relation Age of Onset   Heart failure Mother    Diabetes Mother    Cancer Mother    Healthy Father    Past Surgical History:  Procedure Laterality Date   MOUTH SURGERY     OPEN REDUCTION INTERNAL FIXATION (ORIF) PROXIMAL PHALANX Left 12/29/2020   Procedure: Open treatment of left small finger proximal phalanx fracture;  Surgeon: Mack Hook, MD;  Location: [facility];  Service: Orthopedics;  Laterality: Left;  with preop regional block      Maurie Olesen,  Arlys John, MD 08/13/23 [phone]

## 2023-10-03 ENCOUNTER — Encounter (HOSPITAL_COMMUNITY): Payer: Self-pay

## 2023-10-03 ENCOUNTER — Ambulatory Visit (HOSPITAL_COMMUNITY)
Admission: EM | Admit: 2023-10-03 | Discharge: 2023-10-03 | Disposition: A | Discharge status: Home / Self Care | Payer: Self-pay | Attending: Emergency Medicine | Admitting: Emergency Medicine

## 2023-10-03 DIAGNOSIS — X503XXA Overexertion from repetitive movements, initial encounter: Secondary | ICD-10-CM

## 2023-10-03 DIAGNOSIS — S46912A Strain of unspecified muscle, fascia and tendon at shoulder and upper arm level, left arm, initial encounter: Secondary | ICD-10-CM

## 2023-10-03 MED ORDER — CYCLOBENZAPRINE HCL 10 MG PO TABS: 10.0000 mg | ORAL_TABLET | Freq: Two times a day (BID) | ORAL | 0 refills | Status: DC | PRN

## 2023-10-03 NOTE — ED Provider Notes (Signed)
[facility]    CSN: [id number] Arrival date & time: 10/03/23  1808      History   Chief Complaint Chief Complaint  Patient presents with   Shoulder Pain    HPI Howard Newman is a 42 y.o. male.  Left shoulder pain for a few days Radiates down left side. Believes overuse injury. Works at [employer] center, repetitive movements. Current pain rated 7/10 with movement. No numbness/tingling or weakness.  Used tylenol and ibuprofen with minimal relief. No meds yet today. Has been seen for this in the past  Past Medical History:  Diagnosis Date   Dental disease    Environmental allergies     Patient Active Problem List   Diagnosis Date Noted   Family history of early CAD 03/22/2023   Cigarette nicotine dependence without complication 03/22/2023    Past Surgical History:  Procedure Laterality Date   MOUTH SURGERY     OPEN REDUCTION INTERNAL FIXATION (ORIF) PROXIMAL PHALANX Left 12/29/2020   Procedure: Open treatment of left small finger proximal phalanx fracture;  Surgeon: Mack Hook, MD;  Location: [facility];  Service: Orthopedics;  Laterality: Left;  with preop regional block       Home Medications    Prior to Admission medications   Medication Sig Start Date End Date Taking? Authorizing Provider  cyclobenzaprine (FLEXERIL) 10 MG tablet Take 1 tablet (10 mg total) by mouth 2 (two) times daily as needed for muscle spasms. 10/03/23  Yes Hanish Laraia, Lurena Joiner, PA-C  acetaminophen (TYLENOL) 500 MG tablet Take 1,000 mg by mouth every 6 (six) hours as needed for moderate pain or headache.    [provider]    Family History Family History  Problem Relation Age of Onset   Heart failure Mother    Diabetes Mother    Cancer Mother    Healthy Father     Social History Social History   Tobacco Use   Smoking status: Some Days    Types: Cigars, Cigarettes   Smokeless tobacco: Never  Vaping Use   Vaping status: Some Days    Substances: Nicotine, Flavoring  Substance Use Topics   Alcohol use: Yes   Drug use: Yes    Types: Marijuana     Allergies   Patient has no known allergies.   Review of Systems Review of Systems As per HPI  Physical Exam Triage Vital Signs ED Triage Vitals  Encounter Vitals Group     BP 10/03/23 1932 112/74     Systolic BP Percentile --      Diastolic BP Percentile --      Pulse Rate 10/03/23 1932 72     Resp 10/03/23 1932 18     Temp 10/03/23 1932 97.6 F (36.4 C)     Temp Source 10/03/23 1932 Oral     SpO2 10/03/23 1932 94 %     Weight 10/03/23 1931 200 lb (90.7 kg)     Height 10/03/23 1931 5\' 11"  (1.803 m)     Head Circumference --      Peak Flow --      Pain Score 10/03/23 1931 7     Pain Loc --      Pain Education --      Exclude from Growth Chart --    No data found.  Updated Vital Signs BP 112/74 (BP Location: Right Arm)   Pulse 72   Temp 97.6 F (36.4 C) (Oral)   Resp 18   Ht 5\' 11"  (  1.803 m)   Wt 200 lb (90.7 kg)   SpO2 94%   BMI 27.89 kg/m   Physical Exam Vitals and nursing note reviewed.  Constitutional:      General: He is not in acute distress. Cardiovascular:     Rate and Rhythm: Normal rate and regular rhythm.     Pulses: Normal pulses.          Radial pulses are 2+ on the right side and 2+ on the left side.     Heart sounds: Normal heart sounds.  Pulmonary:     Effort: Pulmonary effort is normal.     Breath sounds: Normal breath sounds.  Musculoskeletal:        General: Tenderness (muscular) present. No swelling. Normal range of motion.     Cervical back: Normal range of motion. No rigidity or tenderness.     Comments: Active and passive ROM normal without pain  Skin:    General: Skin is warm and dry.     Capillary Refill: Capillary refill takes less than 2 seconds.  Neurological:     Mental Status: He is alert and oriented to person, place, and time.     Sensory: No sensory deficit.     Motor: No weakness.     Comments:  Strength 5/5. Sensation normal     UC Treatments / Results  Labs (all labs ordered are listed, but only abnormal results are displayed) Labs Reviewed - No data to display  EKG  Radiology No results found.  Procedures Procedures (including critical care time)  Medications Ordered in UC Medications - No data to display  Initial Impression / Assessment and Plan / UC Course  I have reviewed the triage vital signs and the nursing notes.  Pertinent labs & imaging results that were available during my care of the patient were reviewed by me and considered in my medical decision making (see chart for details).  Normal ROM without pain. No bony tenderness. Patient suspects overuse injury from repetitive motions. No indication for imaging at this time. Offered toradol IM, patient politely declines. Will use oral ibu and tylenol prn, try flexeril BID. Recommend follow up with orthopedics for further assessment and treatment if needed. Work note provided. Patient agrees to plan, no questions at this time  Final Clinical Impressions(s) / UC Diagnoses   Final diagnoses:  Strain of left shoulder, initial encounter  Overuse injury     Discharge Instructions      Ibuprofen/motrin -- you can take up to 800 mg every 6 hours  Flexeril (muscle relaxer) can be taken twice daily. If the medication makes you drowsy, take only at bed time.  Please contact orthopedics for follow up     ED Prescriptions     Medication Sig Dispense Auth. Provider   cyclobenzaprine (FLEXERIL) 10 MG tablet Take 1 tablet (10 mg total) by mouth 2 (two) times daily as needed for muscle spasms. 20 tablet Clothilde Tippetts, Lurena Joiner, PA-C      PDMP not reviewed this encounter.   Macklyn Glandon, Lurena Joiner, [place] 10/04/23 [phone]

## 2023-10-03 NOTE — Discharge Instructions (Addendum)
Ibuprofen/motrin -- you can take up to 800 mg every 6 hours  Flexeril (muscle relaxer) can be taken twice daily. If the medication makes you drowsy, take only at bed time.  Please contact orthopedics for follow up

## 2023-10-03 NOTE — ED Triage Notes (Signed)
Pt presents with left shoulder pain radiating down his left side. 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 Pt currently rates his left shoulder pain a 7/10. Tylenol taken for pain "the other day" with no relief, however Ibuprofen taken, reports little relief after taking.   Pt also mentions athlete's foot on his left foot only. Has been applying his prescribed cream however itching has become more severe lately, especially after working.

## 2023-10-10 ENCOUNTER — Emergency Department (HOSPITAL_COMMUNITY)
Admission: EM | Admit: 2023-10-10 | Discharge: 2023-10-11 | Disposition: A | Discharge status: Home / Self Care | Payer: Self-pay | Attending: Emergency Medicine | Admitting: Emergency Medicine

## 2023-10-10 ENCOUNTER — Other Ambulatory Visit: Payer: Self-pay

## 2023-10-10 ENCOUNTER — Encounter (HOSPITAL_COMMUNITY): Payer: Self-pay

## 2023-10-10 DIAGNOSIS — B353 Tinea pedis: Secondary | ICD-10-CM | POA: Insufficient documentation

## 2023-10-10 NOTE — ED Triage Notes (Signed)
Pt states that he has had rash and itching of left foot x "a few months"

## 2023-10-11 MED ORDER — TERBINAFINE HCL 1 % EX CREA: 1.0000 | TOPICAL_CREAM | Freq: Two times a day (BID) | CUTANEOUS | 0 refills | Status: DC

## 2023-10-11 NOTE — ED Provider Notes (Signed)
  [place] [facility] AT [facility] Provider Note   CSN: [id number] Arrival date & time: 10/10/23  2251     History  Chief Complaint  Patient presents with   Foot Pain    Howard Newman is a 42 y.o. male.   Foot Pain  Patient with left foot itchiness.  Has been for months.  States he has had previously and had a white-cream that he put on it that helped.  States he has to scratch it at work.  No fevers or chills.  Is only on his left foot.    Past Medical History:  Diagnosis Date   Dental disease    Environmental allergies     Home Medications Prior to Admission medications   Medication Sig Start Date End Date Taking? Authorizing Provider  terbinafine  (LAMISIL  AT) 1 % cream Apply 1 Application topically 2 (two) times daily. 10/11/23  Yes Patsey Lot, MD  acetaminophen  (TYLENOL ) 500 MG tablet Take 1,000 mg by mouth every 6 (six) hours as needed for moderate pain or headache.    [provider]  cyclobenzaprine  (FLEXERIL ) 10 MG tablet Take 1 tablet (10 mg total) by mouth 2 (two) times daily as needed for muscle spasms. 10/03/23   Rising, [place], PA-C      Allergies    Patient has no known allergies.    Review of Systems   Review of Systems  Physical Exam Updated Vital Signs BP 111/89 (BP Location: Right Arm)   Pulse 75   Temp 98.3 F (36.8 C) (Oral)   Resp 17   Ht 5' 11 (1.803 m)   Wt 90.7 kg   SpO2 99%   BMI 27.89 kg/m  Physical Exam Vitals and nursing note reviewed.  Skin:    Comments: Lateral aspect of the dorsum of his left foot has dried skin but appears paretic.  Neurological:     Mental Status: He is alert.     ED Results / Procedures / Treatments   Labs (all labs ordered are listed, but only abnormal results are displayed) Labs Reviewed - No data to display  EKG None  Radiology No results found.  Procedures Procedures    Medications Ordered in ED Medications - No data to display  ED  Course/ Medical Decision Making/ A&P                                 Medical Decision Making Risk OTC drugs.   Patient with pruritus and rash of lateral left foot.  Differential diagnose includes dry skin and other causes such as tinea pedis.  Will treat with topical antifungal.  Well-appearing.  Will discharge home.        Final Clinical Impression(s) / ED Diagnoses Final diagnoses:  Tinea pedis of left foot    Rx / DC Orders ED Discharge Orders          Ordered    terbinafine  (LAMISIL  AT) 1 % cream  2 times daily        10/11/23 0858              Patsey Lot, MD 10/11/23 [phone]

## 2023-11-07 ENCOUNTER — Encounter (HOSPITAL_COMMUNITY): Payer: Self-pay

## 2023-11-07 ENCOUNTER — Ambulatory Visit (HOSPITAL_COMMUNITY)
Admission: EM | Admit: 2023-11-07 | Discharge: 2023-11-07 | Disposition: A | Discharge status: Home / Self Care | Payer: 59 | Attending: Nurse Practitioner | Admitting: Nurse Practitioner

## 2023-11-07 DIAGNOSIS — S39012A Strain of muscle, fascia and tendon of lower back, initial encounter: Secondary | ICD-10-CM

## 2023-11-07 MED ORDER — KETOROLAC TROMETHAMINE 60 MG/2ML IM SOLN: 60.0000 mg | Freq: Once | INTRAMUSCULAR | Status: DC

## 2023-11-07 MED ORDER — KETOROLAC TROMETHAMINE 60 MG/2ML IM SOLN
INTRAMUSCULAR | Status: AC
  Filled 2023-11-07: qty 2

## 2023-11-07 MED ORDER — IBUPROFEN 800 MG PO TABS: 800.0000 mg | ORAL_TABLET | Freq: Three times a day (TID) | ORAL | 0 refills | Status: DC

## 2023-11-07 NOTE — ED Triage Notes (Signed)
Pt states lower back pain.  States he worked a double shift yesterday and does heavy lifting.  Pt has not taken anything for the pain at home.

## 2023-11-07 NOTE — ED Provider Notes (Signed)
[facility]    CSN: [id number] Arrival date & time: 11/07/23  1952      History   Chief Complaint Chief Complaint  Patient presents with   Back Pain    HPI Howard Newman is a 43 y.o. male.   HPI  He is in today for evaluation of low back pain.  He reports that he was working on yesterday and did a double shift he felt like he was doing a lot of twisting and turning along with lifting.  This caused a strain to his back.  He denies any numbness tingling, weakness or change in bowel or bladder. Past Medical History:  Diagnosis Date   Dental disease    Environmental allergies     Patient Active Problem List   Diagnosis Date Noted   Family history of early CAD 03/22/2023   Cigarette nicotine dependence without complication 03/22/2023    Past Surgical History:  Procedure Laterality Date   MOUTH SURGERY     OPEN REDUCTION INTERNAL FIXATION (ORIF) PROXIMAL PHALANX Left 12/29/2020   Procedure: Open treatment of left small finger proximal phalanx fracture;  Surgeon: Mack Hook, MD;  Location: [facility];  Service: Orthopedics;  Laterality: Left;  with preop regional block       Home Medications    Prior to Admission medications   Medication Sig Start Date End Date Taking? Authorizing Provider  ibuprofen (ADVIL) 800 MG tablet Take 1 tablet (800 mg total) by mouth 3 (three) times daily. 11/07/23  Yes Barbette Merino, NP  acetaminophen (TYLENOL) 500 MG tablet Take 1,000 mg by mouth every 6 (six) hours as needed for moderate pain or headache.    [provider]  cyclobenzaprine (FLEXERIL) 10 MG tablet Take 1 tablet (10 mg total) by mouth 2 (two) times daily as needed for muscle spasms. 10/03/23   Rising, Lurena Joiner, PA-C  terbinafine (LAMISIL AT) 1 % cream Apply 1 Application topically 2 (two) times daily. 10/11/23   Benjiman Core, MD    Family History Family History  Problem Relation Age of Onset   Heart failure Mother     Diabetes Mother    Cancer Mother    Healthy Father     Social History Social History   Tobacco Use   Smoking status: Some Days    Types: Cigars, Cigarettes   Smokeless tobacco: Never  Vaping Use   Vaping status: Some Days   Substances: Nicotine, Flavoring  Substance Use Topics   Alcohol use: Yes   Drug use: Yes    Types: Marijuana     Allergies   Patient has no known allergies.   Review of Systems Review of Systems   Physical Exam Triage Vital Signs ED Triage Vitals  Encounter Vitals Group     BP 11/07/23 2030 100/68     Systolic BP Percentile --      Diastolic BP Percentile --      Pulse Rate 11/07/23 2030 82     Resp 11/07/23 2030 16     Temp 11/07/23 2030 98.7 F (37.1 C)     Temp Source 11/07/23 2030 Oral     SpO2 11/07/23 2030 95 %     Weight --      Height --      Head Circumference --      Peak Flow --      Pain Score 11/07/23 2029 8     Pain Loc --      Pain Education --  Exclude from Growth Chart --    No data found.  Updated Vital Signs BP 100/68 (BP Location: Right Arm)   Pulse 82   Temp 98.7 F (37.1 C) (Oral)   Resp 16   SpO2 95%   Visual Acuity Right Eye Distance:   Left Eye Distance:   Bilateral Distance:    Right Eye Near:   Left Eye Near:    Bilateral Near:     Physical Exam Constitutional:      General: He is not in acute distress. HENT:     Head: Normocephalic.  Cardiovascular:     Rate and Rhythm: Normal rate.  Pulmonary:     Effort: Pulmonary effort is normal.  Musculoskeletal:        General: Normal range of motion.     Lumbar back: Tenderness present. Normal range of motion. Negative right straight leg raise test and negative left straight leg raise test.  Skin:    General: Skin is warm.     Capillary Refill: Capillary refill takes less than 2 seconds.  Neurological:     General: No focal deficit present.     Mental Status: He is alert and oriented to person, place, and time.  Psychiatric:        Mood  and Affect: Mood normal.      UC Treatments / Results  Labs (all labs ordered are listed, but only abnormal results are displayed) Labs Reviewed - No data to display  EKG   Radiology No results found.  Procedures Procedures (including critical care time)  Medications Ordered in UC Medications - No data to display   Initial Impression / Assessment and Plan / UC Course  I have reviewed the triage vital signs and the nursing notes.  Pertinent labs & imaging results that were available during my care of the patient were reviewed by me and considered in my medical decision making (see chart for details).     Low back pain Final Clinical Impressions(s) / UC Diagnoses   Final diagnoses:  Strain of lumbar region, initial encounter     Discharge Instructions      You have been treated for low back pain.  You have also been started on ibuprofen 800 mg 1 tablet every 8 hours not to start until tomorrow.     ED Prescriptions     Medication Sig Dispense Auth. Provider   ibuprofen (ADVIL) 800 MG tablet Take 1 tablet (800 mg total) by mouth 3 (three) times daily. 21 tablet Barbette Merino, NP      PDMP not reviewed this encounter.   Thad Ranger [place], [place] 11/07/23 2130

## 2023-11-07 NOTE — Discharge Instructions (Addendum)
You have been treated for low back pain.  You have also been started on ibuprofen 800 mg 1 tablet every 8 hours not to start until tomorrow.

## 2023-11-14 ENCOUNTER — Ambulatory Visit
Admission: EM | Admit: 2023-11-14 | Discharge: 2023-11-14 | Disposition: A | Discharge status: Home / Self Care | Payer: 59 | Attending: Physician Assistant | Admitting: Physician Assistant

## 2023-11-14 ENCOUNTER — Encounter: Payer: Self-pay | Admitting: Emergency Medicine

## 2023-11-14 DIAGNOSIS — R369 Urethral discharge, unspecified: Secondary | ICD-10-CM | POA: Insufficient documentation

## 2023-11-14 MED ORDER — CEFTRIAXONE SODIUM 500 MG IJ SOLR
500.0000 mg | Freq: Once | INTRAMUSCULAR | Status: AC
Start: 2023-11-14 — End: 2023-11-14
  Administered 2023-11-14: 500 mg via INTRAMUSCULAR

## 2023-11-14 NOTE — ED Provider Notes (Signed)
[name]    CSN: [id number] Arrival date & time: 11/14/23  1521      History   Chief Complaint Chief Complaint  Patient presents with   SEXUALLY TRANSMITTED DISEASE    Pt presents for std testing.    Dental Pain    Pt states he has an abscess on his gums.    UTI    Frequent urination. Symptoms onset a while ago.     HPI Howard Newman is a 43 y.o. male.   Patient request testing for STDs.  Patient reports he had intercourse a week ago and is concerned about STDs.  Patient is also concerned that he could have an abscess in the top of his mouth.  Patient reports that the area is not swollen today but it was swollen 2 days in a row when he got off work.  He has had all of his teeth extracted.  The history is provided by the patient. No language interpreter was used.  Dental Pain   Past Medical History:  Diagnosis Date   Dental disease    Environmental allergies     Patient Active Problem List   Diagnosis Date Noted   Family history of early CAD 03/22/2023   Cigarette nicotine dependence without complication 03/22/2023    Past Surgical History:  Procedure Laterality Date   MOUTH SURGERY     OPEN REDUCTION INTERNAL FIXATION (ORIF) PROXIMAL PHALANX Left 12/29/2020   Procedure: Open treatment of left small finger proximal phalanx fracture;  Surgeon: Mack Hook, MD;  Location: [facility];  Service: Orthopedics;  Laterality: Left;  with preop regional block       Home Medications    Prior to Admission medications   Medication Sig Start Date End Date Taking? Authorizing Provider  acetaminophen (TYLENOL) 500 MG tablet Take 1,000 mg by mouth every 6 (six) hours as needed for moderate pain or headache.    [provider]  cyclobenzaprine (FLEXERIL) 10 MG tablet Take 1 tablet (10 mg total) by mouth 2 (two) times daily as needed for muscle spasms. 10/03/23   Rising, Lurena Joiner, PA-C  ibuprofen (ADVIL) 800 MG tablet Take 1 tablet  (800 mg total) by mouth 3 (three) times daily. 11/07/23   Barbette Merino, NP  terbinafine (LAMISIL AT) 1 % cream Apply 1 Application topically 2 (two) times daily. 10/11/23   Benjiman Core, MD    Family History Family History  Problem Relation Age of Onset   Heart failure Mother    Diabetes Mother    Cancer Mother    Healthy Father     Social History Social History   Tobacco Use   Smoking status: Some Days    Types: Cigars, Cigarettes   Smokeless tobacco: Never  Vaping Use   Vaping status: Some Days   Substances: Nicotine, Flavoring  Substance Use Topics   Alcohol use: Yes   Drug use: Yes    Types: Marijuana     Allergies   Patient has no allergy information on record.   Review of Systems Review of Systems  All other systems reviewed and are negative.    Physical Exam Triage Vital Signs ED Triage Vitals  Encounter Vitals Group     BP 11/14/23 1835 123/83     Systolic BP Percentile --      Diastolic BP Percentile --      Pulse Rate 11/14/23 1835 81     Resp 11/14/23 1835 18     Temp  11/14/23 1835 97.9 F (36.6 C)     Temp Source 11/14/23 1835 Oral     SpO2 11/14/23 1835 96 %     Weight 11/14/23 1831 199 lb 15.3 oz (90.7 kg)     Height 11/14/23 1831 5\' 11"  (1.803 m)     Head Circumference --      Peak Flow --      Pain Score 11/14/23 1831 0     Pain Loc --      Pain Education --      Exclude from Growth Chart --    No data found.  Updated Vital Signs BP 123/83 (BP Location: Left Arm)   Pulse 81   Temp 97.9 F (36.6 C) (Oral)   Resp 18   Ht 5\' 11"  (1.803 m)   Wt 90.7 kg   SpO2 96%   BMI 27.89 kg/m   Visual Acuity Right Eye Distance:   Left Eye Distance:   Bilateral Distance:    Right Eye Near:   Left Eye Near:    Bilateral Near:     Physical Exam Vitals and nursing note reviewed.  Constitutional:      Appearance: He is well-developed.  HENT:     Head: Normocephalic.     Mouth/Throat:     Mouth: Mucous membranes are moist.      Comments: Upper teeth extracted gumline no sign of infection. Cardiovascular:     Rate and Rhythm: Normal rate.  Pulmonary:     Effort: Pulmonary effort is normal.  Abdominal:     General: There is no distension.  Musculoskeletal:        General: Normal range of motion.     Cervical back: Normal range of motion.  Skin:    General: Skin is warm.  Neurological:     General: No focal deficit present.     Mental Status: He is alert and oriented to person, place, and time.      UC Treatments / Results  Labs (all labs ordered are listed, but only abnormal results are displayed) Labs Reviewed  HIV ANTIBODY (ROUTINE TESTING W REFLEX)  CYTOLOGY, (ORAL, ANAL, URETHRAL) ANCILLARY ONLY    EKG   Radiology No results found.  Procedures Procedures (including critical care time)  Medications Ordered in UC Medications  cefTRIAXone (ROCEPHIN) injection 500 mg (has no administration in time range)    Initial Impression / Assessment and Plan / UC Course  I have reviewed the triage vital signs and the nursing notes.  Pertinent labs & imaging results that were available during my care of the patient were reviewed by me and considered in my medical decision making (see chart for details).      Final Clinical Impressions(s) / UC Diagnoses   Final diagnoses:  Discharge from penis   Discharge Instructions   None    ED Prescriptions   None    PDMP not reviewed this encounter. An After Visit Summary was printed and given to the patient.       Elson Areas, [place] 11/14/23 1928

## 2023-11-14 NOTE — ED Triage Notes (Signed)
Pt presents for complete STD testing.

## 2023-11-15 LAB — CYTOLOGY, (ORAL, ANAL, URETHRAL) ANCILLARY ONLY
Chlamydia: NEGATIVE
Comment: NEGATIVE
Comment: NEGATIVE
Comment: NORMAL
Neisseria Gonorrhea: NEGATIVE
Trichomonas: NEGATIVE

## 2023-11-15 LAB — HIV ANTIBODY (ROUTINE TESTING W REFLEX): HIV Screen 4th Generation wRfx: NONREACTIVE

## 2023-11-21 ENCOUNTER — Ambulatory Visit (HOSPITAL_COMMUNITY)
Admission: RE | Admit: 2023-11-21 | Discharge: 2023-11-21 | Disposition: A | Discharge status: Home / Self Care | Payer: 59 | Source: Ambulatory Visit | Attending: Emergency Medicine | Admitting: Emergency Medicine

## 2023-11-21 ENCOUNTER — Encounter (HOSPITAL_COMMUNITY): Payer: Self-pay

## 2023-11-21 VITALS — BP 114/76 | HR 73 | Temp 98.1°F | Resp 16 | Ht 71.0 in | Wt 190.0 lb

## 2023-11-21 DIAGNOSIS — R369 Urethral discharge, unspecified: Secondary | ICD-10-CM | POA: Insufficient documentation

## 2023-11-21 NOTE — ED Triage Notes (Signed)
 Patient here today with c/o penile discharge X 4 days. Patient notices it before urinating.

## 2023-11-21 NOTE — Discharge Instructions (Signed)
 Your results will come back over the next few days and someone will call if results are positive and require treatment.  Return here as needed.

## 2023-11-21 NOTE — ED Provider Notes (Signed)
 [facility]    CSN: [id number] Arrival date & time: 11/21/23  1914      History   Chief Complaint Chief Complaint  Patient presents with   Penile Discharge    HPI Howard Newman is a 43 y.o. male.   Patient presents with penile discharge x4 days. Denies dysuria, hematuria, abdominal pain, flank pain, and fever.    Penile Discharge Pertinent negatives include no abdominal pain.    Past Medical History:  Diagnosis Date   Dental disease    Environmental allergies     Patient Active Problem List   Diagnosis Date Noted   Family history of early CAD 03/22/2023   Cigarette nicotine dependence without complication 03/22/2023    Past Surgical History:  Procedure Laterality Date   MOUTH SURGERY     OPEN REDUCTION INTERNAL FIXATION (ORIF) PROXIMAL PHALANX Left 12/29/2020   Procedure: Open treatment of left small finger proximal phalanx fracture;  Surgeon: Rober Chimera, MD;  Location: [facility];  Service: Orthopedics;  Laterality: Left;  with preop regional block       Home Medications    Prior to Admission medications   Medication Sig Start Date End Date Taking? Authorizing Provider  acetaminophen  (TYLENOL ) 500 MG tablet Take 1,000 mg by mouth every 6 (six) hours as needed for moderate pain or headache.    [provider]  cyclobenzaprine  (FLEXERIL ) 10 MG tablet Take 1 tablet (10 mg total) by mouth 2 (two) times daily as needed for muscle spasms. 10/03/23   Rising, Ivette Marks, PA-C  ibuprofen  (ADVIL ) 800 MG tablet Take 1 tablet (800 mg total) by mouth 3 (three) times daily. 11/07/23   Gregoria Leas, NP  terbinafine  (LAMISIL  AT) 1 % cream Apply 1 Application topically 2 (two) times daily. 10/11/23   Mozell Arias, MD    Family History Family History  Problem Relation Age of Onset   Heart failure Mother    Diabetes Mother    Cancer Mother    Healthy Father     Social History Social History   Tobacco Use   Smoking  status: Some Days    Types: Cigars, Cigarettes   Smokeless tobacco: Never  Vaping Use   Vaping status: Some Days   Substances: Nicotine, Flavoring  Substance Use Topics   Alcohol use: Yes   Drug use: Yes    Types: Marijuana     Allergies   Patient has no known allergies.   Review of Systems Review of Systems  Constitutional:  Negative for fever.  Gastrointestinal:  Negative for abdominal pain.  Genitourinary:  Positive for penile discharge. Negative for dysuria, flank pain, hematuria, penile pain and penile swelling.     Physical Exam Triage Vital Signs ED Triage Vitals  Encounter Vitals Group     BP 11/21/23 0918 114/76     Systolic BP Percentile --      Diastolic BP Percentile --      Pulse Rate 11/21/23 0918 73     Resp 11/21/23 0918 16     Temp 11/21/23 0918 98.1 F (36.7 C)     Temp Source 11/21/23 0918 Oral     SpO2 11/21/23 0918 96 %     Weight 11/21/23 0919 190 lb (86.2 kg)     Height 11/21/23 0919 5\' 11"  (1.803 m)     Head Circumference --      Peak Flow --      Pain Score 11/21/23 0919 0     Pain  Loc --      Pain Education --      Exclude from Growth Chart --    No data found.  Updated Vital Signs BP 114/76 (BP Location: Left Arm)   Pulse 73   Temp 98.1 F (36.7 C) (Oral)   Resp 16   Ht 5\' 11"  (1.803 m)   Wt 190 lb (86.2 kg)   SpO2 96%   BMI 26.50 kg/m   Visual Acuity Right Eye Distance:   Left Eye Distance:   Bilateral Distance:    Right Eye Near:   Left Eye Near:    Bilateral Near:     Physical Exam Vitals and nursing note reviewed.  Constitutional:      General: He is awake. He is not in acute distress.    Appearance: Normal appearance. He is well-developed and well-groomed. He is not ill-appearing.  Abdominal:     Tenderness: There is no abdominal tenderness. There is no right CVA tenderness or left CVA tenderness.     Comments: Exam deferred.   Neurological:     Mental Status: He is alert.  Psychiatric:        Behavior:  Behavior is cooperative.      UC Treatments / Results  Labs (all labs ordered are listed, but only abnormal results are displayed) Labs Reviewed  CYTOLOGY, (ORAL, ANAL, URETHRAL) ANCILLARY ONLY    EKG   Radiology No results found.  Procedures Procedures (including critical care time)  Medications Ordered in UC Medications - No data to display  Initial Impression / Assessment and Plan / UC Course  I have reviewed the triage vital signs and the nursing notes.  Pertinent labs & imaging results that were available during my care of the patient were reviewed by me and considered in my medical decision making (see chart for details).     Patient presented with 4-day history of penile discharge. Denies any other symptoms.   No significant findings on assessment. GU exam deferred, patient performed self swab for STD.  Discussed follow-up and return precautions.  Final Clinical Impressions(s) / UC Diagnoses   Final diagnoses:  Penile discharge     Discharge Instructions      Your results will come back over the next few days and someone will call if results are positive and require treatment.  Return here as needed.     ED Prescriptions   None    PDMP not reviewed this encounter.   Levora Reas A, NP 11/21/23 1000

## 2023-11-22 ENCOUNTER — Telehealth (HOSPITAL_COMMUNITY): Payer: Self-pay

## 2023-11-22 LAB — CYTOLOGY, (ORAL, ANAL, URETHRAL) ANCILLARY ONLY
Chlamydia: NEGATIVE
Comment: NEGATIVE
Comment: NEGATIVE
Comment: NORMAL
Neisseria Gonorrhea: NEGATIVE
Trichomonas: NEGATIVE

## 2023-11-22 NOTE — Telephone Encounter (Signed)
Informed by front desk staff: "PHONE CALL: Patient called in regards to std results. Seen on 2/10. Best Call back is [phone]."   Patient informed that testing from 11/14/23 was negative. Informed that tested from 11/21/23 is still pending and should result in 2-3 days from collection time. Patient verbalized understanding.   Assisted Patient with mychart. Was informed of his current username and password changed to: [facility]!  Patient verbalized understanding

## 2023-12-11 ENCOUNTER — Ambulatory Visit (HOSPITAL_COMMUNITY)
Admission: EM | Admit: 2023-12-11 | Discharge: 2023-12-11 | Disposition: A | Discharge status: Home / Self Care | Attending: Internal Medicine | Admitting: Internal Medicine

## 2023-12-11 ENCOUNTER — Encounter (HOSPITAL_COMMUNITY): Payer: Self-pay

## 2023-12-11 DIAGNOSIS — M5442 Lumbago with sciatica, left side: Secondary | ICD-10-CM

## 2023-12-11 MED ORDER — PREDNISONE 20 MG PO TABS: 40.0000 mg | ORAL_TABLET | Freq: Every day | ORAL | 0 refills | Status: AC

## 2023-12-11 MED ORDER — CYCLOBENZAPRINE HCL 5 MG PO TABS: 5.0000 mg | ORAL_TABLET | Freq: Three times a day (TID) | ORAL | 0 refills | Status: DC | PRN

## 2023-12-11 NOTE — Discharge Instructions (Addendum)
 Strain of the lumbar back with left sciatica. This is an inflammation in the sciatic nerve. This can be treated with anti-inflammatories and a muscle relaxer. We will treat with the following:  Flexeril 5 mg every 8 hours as needed for muscle spasms.  Use caution as this medication can cause drowsiness. Prednisone 40 mg (2 tablets) once daily for 5 days. Take this in the morning.  This is a steroid to help with inflammation and pain.  Light stretching to help improve symptoms Can use moist heat for 10-15 minutes 2-3 times daily to help with pain. Return to urgent care or PCP if symptoms worsen or fail to resolve.

## 2023-12-11 NOTE — ED Provider Notes (Signed)
 [facility]    CSN: [id number] Arrival date & time: 12/11/23  1702      History   Chief Complaint Chief Complaint  Patient presents with   Back Pain    HPI Howard Newman is a 43 y.o. male.   43 year old male who presents urgent care with complaints of lower back pain with pain into the left buttocks.  He reports that this happened about 6 days ago.  He does report at work about 6 or 7 days ago he did turn quickly and his foot slipped and he got a spasm in his back.  He did not fall.  He was able to continue working but has had some pain in the back since then.  He denies any bowel or bladder incontinence.  He does note when he lays down that he gets some radiation of pain into his buttocks.  He denies any numbness or tingling.  He has had issues with strains in the lumbar area in the past.  He is off of work tomorrow but was unable to work today.   Back Pain Associated symptoms: no abdominal pain, no chest pain, no dysuria and no fever     Past Medical History:  Diagnosis Date   Dental disease    Environmental allergies     Patient Active Problem List   Diagnosis Date Noted   Family history of early CAD 03/22/2023   Cigarette nicotine dependence without complication 03/22/2023    Past Surgical History:  Procedure Laterality Date   MOUTH SURGERY     OPEN REDUCTION INTERNAL FIXATION (ORIF) PROXIMAL PHALANX Left 12/29/2020   Procedure: Open treatment of left small finger proximal phalanx fracture;  Surgeon: Mack Hook, MD;  Location: [facility];  Service: Orthopedics;  Laterality: Left;  with preop regional block       Home Medications    Prior to Admission medications   Medication Sig Start Date End Date Taking? Authorizing Provider  acetaminophen (TYLENOL) 500 MG tablet Take 1,000 mg by mouth every 6 (six) hours as needed for moderate pain or headache.    [provider]  cyclobenzaprine (FLEXERIL) 10 MG tablet Take 1  tablet (10 mg total) by mouth 2 (two) times daily as needed for muscle spasms. 10/03/23   Rising, Lurena Joiner, PA-C  ibuprofen (ADVIL) 800 MG tablet Take 1 tablet (800 mg total) by mouth 3 (three) times daily. 11/07/23   Barbette Merino, NP  terbinafine (LAMISIL AT) 1 % cream Apply 1 Application topically 2 (two) times daily. 10/11/23   Benjiman Core, MD    Family History Family History  Problem Relation Age of Onset   Heart failure Mother    Diabetes Mother    Cancer Mother    Healthy Father     Social History Social History   Tobacco Use   Smoking status: Some Days    Types: Cigars, Cigarettes   Smokeless tobacco: Never  Vaping Use   Vaping status: Some Days   Substances: Nicotine, Flavoring  Substance Use Topics   Alcohol use: Yes   Drug use: Yes    Types: Marijuana     Allergies   Patient has no known allergies.   Review of Systems Review of Systems  Constitutional:  Negative for chills and fever.  HENT:  Negative for ear pain and sore throat.   Eyes:  Negative for pain and visual disturbance.  Respiratory:  Negative for cough and shortness of breath.   Cardiovascular:  Negative for chest pain and palpitations.  Gastrointestinal:  Negative for abdominal pain and vomiting.  Genitourinary:  Negative for dysuria and hematuria.  Musculoskeletal:  Positive for back pain. Negative for arthralgias.  Skin:  Negative for color change and rash.  Neurological:  Negative for seizures and syncope.  All other systems reviewed and are negative.    Physical Exam Triage Vital Signs ED Triage Vitals [12/11/23 1759]  Encounter Vitals Group     BP      Systolic BP Percentile      Diastolic BP Percentile      Pulse      Resp      Temp      Temp src      SpO2      Weight      Height      Head Circumference      Peak Flow      Pain Score 8     Pain Loc      Pain Education      Exclude from Growth Chart    No data found.  Updated Vital Signs There were no vitals  taken for this visit.  Visual Acuity Right Eye Distance:   Left Eye Distance:   Bilateral Distance:    Right Eye Near:   Left Eye Near:    Bilateral Near:     Physical Exam Vitals and nursing note reviewed.  Constitutional:      General: He is not in acute distress.    Appearance: He is well-developed.  HENT:     Head: Normocephalic and atraumatic.  Eyes:     Conjunctiva/sclera: Conjunctivae normal.  Cardiovascular:     Rate and Rhythm: Normal rate and regular rhythm.     Heart sounds: No murmur heard. Pulmonary:     Effort: Pulmonary effort is normal. No respiratory distress.     Breath sounds: Normal breath sounds.  Abdominal:     Palpations: Abdomen is soft.     Tenderness: There is no abdominal tenderness.  Musculoskeletal:        General: No swelling.     Cervical back: Neck supple.     Lumbar back: Positive left straight leg raise test.       Back:  Skin:    General: Skin is warm and dry.     Capillary Refill: Capillary refill takes less than 2 seconds.  Neurological:     Mental Status: He is alert.  Psychiatric:        Mood and Affect: Mood normal.      UC Treatments / Results  Labs (all labs ordered are listed, but only abnormal results are displayed) Labs Reviewed - No data to display  EKG   Radiology No results found.  Procedures Procedures (including critical care time)  Medications Ordered in UC Medications - No data to display  Initial Impression / Assessment and Plan / UC Course  I have reviewed the triage vital signs and the nursing notes.  Pertinent labs & imaging results that were available during my care of the patient were reviewed by me and considered in my medical decision making (see chart for details).     Acute left-sided low back pain with left-sided sciatica   Strain of the lumbar back with left sciatica. This is an inflammation in the sciatic nerve. This can be treated with anti-inflammatories and a muscle relaxer. We  will treat with the following:  Flexeril 5 mg every 8 hours as needed  for muscle spasms.  Use caution as this medication can cause drowsiness. Prednisone 40 mg (2 tablets) once daily for 5 days. Take this in the morning.  This is a steroid to help with inflammation and pain.  Light stretching to help improve symptoms Can use moist heat for 10-15 minutes 2-3 times daily to help with pain. Return to urgent care or PCP if symptoms worsen or fail to resolve.    Final Clinical Impressions(s) / UC Diagnoses   Final diagnoses:  None   Discharge Instructions   None    ED Prescriptions   None    PDMP not reviewed this encounter.   Landis Martins, [place] 12/11/23 1815

## 2023-12-11 NOTE — ED Triage Notes (Signed)
 Patient presenting with low back pain onset 6 days ago at work. Patient denies any known falls or injuries. States thinks he may have pulled it lifting heavy at work.  Prescriptions or OTC medications tried: No

## 2024-01-08 ENCOUNTER — Encounter (HOSPITAL_COMMUNITY): Payer: Self-pay | Admitting: Emergency Medicine

## 2024-01-08 ENCOUNTER — Ambulatory Visit (HOSPITAL_COMMUNITY): Admission: EM | Admit: 2024-01-08 | Discharge: 2024-01-08 | Disposition: A | Discharge status: Home / Self Care

## 2024-01-08 DIAGNOSIS — M62830 Muscle spasm of back: Secondary | ICD-10-CM | POA: Diagnosis not present

## 2024-01-08 MED ORDER — DICLOFENAC SODIUM 75 MG PO TBEC: 75.0000 mg | DELAYED_RELEASE_TABLET | Freq: Two times a day (BID) | ORAL | 0 refills | Status: DC

## 2024-01-08 MED ORDER — BACLOFEN 20 MG PO TABS
20.0000 mg | ORAL_TABLET | Freq: Three times a day (TID) | ORAL | 0 refills | Status: DC
Start: 2024-01-08 — End: 2024-03-09

## 2024-01-08 MED ORDER — PREDNISONE 20 MG PO TABS: 40.0000 mg | ORAL_TABLET | Freq: Every day | ORAL | 0 refills | Status: AC

## 2024-01-08 NOTE — ED Provider Notes (Signed)
 [facility]  Note:  This document was prepared using Dragon voice recognition software and may include unintentional dictation errors.  MRN: [id number] DOB: February 06, 1981  Subjective:   Howard Newman is a 43 y.o. male presenting for bilateral lower back muscle pain x 1 to 2 weeks.  Denies any specific incident where he hurt his back, states he did slip and fall at work last week but symptoms had started before that.  Patient denies any past severe trauma or injury to back.  Patient has not taken any medication to treat symptoms.  Patient states he has had mild back pain in the past which was improved with prednisone.  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  Denies any radiation of pain down his legs or difficulty walking.  No current facility-administered medications for this encounter.  Current Outpatient Medications:    baclofen (LIORESAL) 20 MG tablet, Take 1 tablet (20 mg total) by mouth 3 (three) times daily., Disp: 30 each, Rfl: 0   diclofenac (VOLTAREN) 75 MG EC tablet, Take 1 tablet (75 mg total) by mouth 2 (two) times daily., Disp: 30 tablet, Rfl: 0   predniSONE (DELTASONE) 20 MG tablet, Take 2 tablets (40 mg total) by mouth daily for 5 days., Disp: 10 tablet, Rfl: 0   cyclobenzaprine (FLEXERIL) 5 MG tablet, Take 1 tablet (5 mg total) by mouth every 8 (eight) hours as needed for muscle spasms., Disp: 30 tablet, Rfl: 0   No Known Allergies  Past Medical History:  Diagnosis Date   Dental disease    Environmental allergies      Past Surgical History:  Procedure Laterality Date   MOUTH SURGERY     OPEN REDUCTION INTERNAL FIXATION (ORIF) PROXIMAL PHALANX Left 12/29/2020   Procedure: Open treatment of left small finger proximal phalanx fracture;  Surgeon: Mack Hook, MD;  Location: [facility];  Service:  Orthopedics;  Laterality: Left;  with preop regional block    Family History  Problem Relation Age of Onset   Heart failure Mother    Diabetes Mother    Cancer Mother    Healthy Father     Social History   Tobacco Use   Smoking status: Some Days    Types: Cigars, Cigarettes   Smokeless tobacco: Never  Vaping Use   Vaping status: Some Days   Substances: Nicotine, Flavoring  Substance Use Topics   Alcohol use: Yes   Drug use: Yes    Types: Marijuana    ROS Refer to HPI for ROS details.  Objective:   Vitals: BP 106/70 (BP Location: Left Arm)   Pulse 75   Temp 98.4 F (36.9 C) (Oral)   Resp 14   SpO2 96%   Physical Exam Vitals and nursing note reviewed.  Constitutional:      General: He is not in acute distress.    Appearance: Normal appearance. He is well-developed. He is not ill-appearing or toxic-appearing.  HENT:     Head: Normocephalic.  Cardiovascular:     Rate and Rhythm: Normal rate.  Pulmonary:     Effort: Pulmonary effort is normal. No respiratory distress.  Musculoskeletal:     Cervical back: Normal.     Thoracic back: Spasms and tenderness present. No swelling or bony tenderness. Normal range of motion.     Lumbar back: Spasms and tenderness present.  No swelling or bony tenderness. Normal range of motion. Negative right straight leg raise test and negative left straight leg raise test.  Skin:    General: Skin is warm and dry.  Neurological:     General: No focal deficit present.     Mental Status: He is alert and oriented to person, place, and time.  Psychiatric:        Mood and Affect: Mood normal.     Procedures  No results found for this or any previous visit (from the past 24 hours).  Assessment and Plan :   PDMP not reviewed this encounter.  1. Muscle spasm of back    1. Muscle spasm of back (Primary) - baclofen (LIORESAL) 20 MG tablet; Take 1 tablet (20 mg total) by mouth 3 (three) times daily.  Dispense: 30 each; Refill: 0 -  diclofenac (VOLTAREN) 75 MG EC tablet; Take 1 tablet (75 mg total) by mouth 2 (two) times daily.  Dispense: 30 tablet; Refill: 0 -You may take diclofenac before and after work do not take baclofen when you have to work or drive as it may cause drowsiness. -predniSONE (DELTASONE) 20 MG tabletTake 2 tablets (40 mg total) by mouth daily for 5 days for back pain and inflammation. -Do not take prednisone and diclofenac as they work very similarly and may cause gastritis. -Continue to monitor symptoms for any change in severity if there is any escalation of current symptoms or development of new symptoms follow-up in ER for further evaluation and management.  Lucky Cowboy   [place], [place], [place] 01/08/24 1747

## 2024-01-08 NOTE — Discharge Instructions (Addendum)
 1. Muscle spasm of back (Primary) - baclofen (LIORESAL) 20 MG tablet; Take 1 tablet (20 mg total) by mouth 3 (three) times daily.  Dispense: 30 each; Refill: 0 - diclofenac (VOLTAREN) 75 MG EC tablet; Take 1 tablet (75 mg total) by mouth 2 (two) times daily.  Dispense: 30 tablet; Refill: 0 -You may take diclofenac before and after work do not take baclofen when you have to work or drive as it may cause drowsiness. -predniSONE (DELTASONE) 20 MG tabletTake 2 tablets (40 mg total) by mouth daily for 5 days for back pain and inflammation. -Do not take prednisone and diclofenac as they work very similarly and may cause gastritis.

## 2024-01-08 NOTE — ED Triage Notes (Signed)
 Pt c/o back pain for over week. Reports works at [employer]. Hasn't tried any measures for pain.

## 2024-03-09 ENCOUNTER — Ambulatory Visit (HOSPITAL_COMMUNITY)
Admission: EM | Admit: 2024-03-09 | Discharge: 2024-03-09 | Disposition: A | Discharge status: Home / Self Care | Attending: Emergency Medicine | Admitting: Emergency Medicine

## 2024-03-09 ENCOUNTER — Encounter (HOSPITAL_COMMUNITY): Payer: Self-pay

## 2024-03-09 DIAGNOSIS — M545 Low back pain, unspecified: Secondary | ICD-10-CM | POA: Diagnosis not present

## 2024-03-09 MED ORDER — TIZANIDINE HCL 4 MG PO TABS
4.0000 mg | ORAL_TABLET | Freq: Every evening | ORAL | 0 refills | Status: DC | PRN
Start: 2024-03-09 — End: 2024-07-01

## 2024-03-09 MED ORDER — IBUPROFEN 800 MG PO TABS: 800.0000 mg | ORAL_TABLET | Freq: Three times a day (TID) | ORAL | 0 refills | Status: DC

## 2024-03-09 NOTE — ED Provider Notes (Signed)
 [facility]    CSN: [id number] Arrival date & time: 03/09/24  1718      History   Chief Complaint Chief Complaint  Patient presents with   Back Pain    HPI [facility] Newman is a 43 y.o. male.  Here with 1 week history of right low back pain Does a lot of heavy lifting at work. No direct injury, trauma, fall. Not radiating down into the legs. No weakness. Denies bladder or bowel dysfunction. No fevers Has not attempted intervention. Has history of low back pain Seen twice in march for this, given prednisone  both times  Past Medical History:  Diagnosis Date   Dental disease    Environmental allergies     Patient Active Problem List   Diagnosis Date Noted   Family history of early CAD 03/22/2023   Cigarette nicotine dependence without complication 03/22/2023    Past Surgical History:  Procedure Laterality Date   MOUTH SURGERY     OPEN REDUCTION INTERNAL FIXATION (ORIF) PROXIMAL PHALANX Left 12/29/2020   Procedure: Open treatment of left small finger proximal phalanx fracture;  Surgeon: Rober Chimera, MD;  Location: [facility];  Service: Orthopedics;  Laterality: Left;  with preop regional block       Home Medications    Prior to Admission medications   Medication Sig Start Date End Date Taking? Authorizing Provider  ibuprofen  (ADVIL ) 800 MG tablet Take 1 tablet (800 mg total) by mouth 3 (three) times daily. 03/09/24  Yes Gatlin Kittell, Ivette Marks, PA-C  tiZANidine (ZANAFLEX) 4 MG tablet Take 1 tablet (4 mg total) by mouth at bedtime as needed for muscle spasms. 03/09/24  Yes Stokely Jeancharles, Ivette Marks, PA-C    Family History Family History  Problem Relation Age of Onset   Heart failure Mother    Diabetes Mother    Cancer Mother    Healthy Father     Social History Social History   Tobacco Use   Smoking status: Some Days    Types: Cigars, Cigarettes   Smokeless tobacco: Never  Vaping Use   Vaping status: Some Days   Substances: Nicotine,  Flavoring  Substance Use Topics   Alcohol use: Yes   Drug use: Yes    Types: Marijuana     Allergies   Patient has no known allergies.   Review of Systems Review of Systems  Musculoskeletal:  Positive for back pain.   Per HPI  Physical Exam Triage Vital Signs ED Triage Vitals  Encounter Vitals Group     BP 03/09/24 1751 123/83     Systolic BP Percentile --      Diastolic BP Percentile --      Pulse Rate 03/09/24 1751 73     Resp 03/09/24 1751 18     Temp 03/09/24 1751 98.2 F (36.8 C)     Temp Source 03/09/24 1751 Oral     SpO2 03/09/24 1751 96 %     Weight --      Height --      Head Circumference --      Peak Flow --      Pain Score 03/09/24 1752 6     Pain Loc --      Pain Education --      Exclude from Growth Chart --    No data found.  Updated Vital Signs BP 123/83 (BP Location: Left Arm)   Pulse 73   Temp 98.2 F (36.8 C) (Oral)   Resp 18   SpO2  96%    Physical Exam Vitals and nursing note reviewed.  Constitutional:      General: He is not in acute distress. HENT:     Mouth/Throat:     Mouth: Mucous membranes are moist.     Pharynx: Oropharynx is clear.  Eyes:     Extraocular Movements: Extraocular movements intact.     Conjunctiva/sclera: Conjunctivae normal.     Pupils: Pupils are equal, round, and reactive to light.  Cardiovascular:     Rate and Rhythm: Normal rate and regular rhythm.     Heart sounds: Normal heart sounds.  Pulmonary:     Effort: Pulmonary effort is normal.     Breath sounds: Normal breath sounds.  Musculoskeletal:        General: Normal range of motion.     Cervical back: Normal range of motion. No rigidity or tenderness.     Lumbar back: Tenderness present.       Back:     Comments: Right low back muscular tenderness. No bony tenderness of C-L spine. Full ROM neck, back, extremities.   Skin:    General: Skin is warm and dry.     Comments: No redness, rash, lesions, bruising   Neurological:     General: No  focal deficit present.     Mental Status: He is alert and oriented to person, place, and time.     Cranial Nerves: Cranial nerves 2-12 are intact. No cranial nerve deficit.     Sensory: Sensation is intact.     Motor: Motor function is intact. No weakness.     Coordination: Coordination is intact.     Gait: Gait is intact.     Deep Tendon Reflexes: Reflexes are normal and symmetric.     Comments: Strength 5/5. Sensation intact throughout      UC Treatments / Results  Labs (all labs ordered are listed, but only abnormal results are displayed) Labs Reviewed - No data to display  EKG  Radiology No results found.  Procedures Procedures (including critical care time)  Medications Ordered in UC Medications - No data to display  Initial Impression / Assessment and Plan / UC Course  I have reviewed the triage vital signs and the nursing notes.  Pertinent labs & imaging results that were available during my care of the patient were reviewed by me and considered in my medical decision making (see chart for details).  No bony tenderness of back, defer imaging. Pain is right lower, muscular. No red flags. Stable vitals, neurologically intact. Discussed supportive care, trying zanaflex  at bedtime, ibuprofen  and tylenol , topical treatments, etc. Additionally recommend follow up with orthopedics. Note for work is provided Return and ED precautions  Patient agrees to plan, no questions   Final Clinical Impressions(s) / UC Diagnoses   Final diagnoses:  Acute right-sided low back pain without sciatica     Discharge Instructions      You can take the muscle relaxer (Zanaflex ) before bedtime -- this might make you drowsy!!  Ibuprofen  can be used every 6 hours. You can also alternate with tylenol .   Use topical biofreeze as needed, and hot pad  Avoid heavy lifting and strenuous activity  Please follow up with orthopedics   ED Prescriptions     Medication Sig Dispense Auth.  Provider   tiZANidine  (ZANAFLEX ) 4 MG tablet Take 1 tablet (4 mg total) by mouth at bedtime as needed for muscle spasms. 30 tablet Skyanne Welle, PA-C   ibuprofen  (ADVIL ) 800 MG  tablet Take 1 tablet (800 mg total) by mouth 3 (three) times daily. 21 tablet Kaleah Hagemeister, Ivette Marks, PA-C      PDMP not reviewed this encounter.   Marie Chow, Ivette Marks, [place] 03/09/24 2018

## 2024-03-09 NOTE — Discharge Instructions (Addendum)
 You can take the muscle relaxer (Zanaflex) before bedtime -- this might make you drowsy!!  Ibuprofen  can be used every 6 hours. You can also alternate with tylenol .   Use topical biofreeze as needed, and hot pad  Avoid heavy lifting and strenuous activity  Please follow up with orthopedics

## 2024-03-09 NOTE — ED Triage Notes (Signed)
 Pt c/o center lower back pain x1wk. 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

## 2024-03-24 ENCOUNTER — Encounter (HOSPITAL_COMMUNITY): Payer: Self-pay | Admitting: *Deleted

## 2024-03-24 ENCOUNTER — Emergency Department (HOSPITAL_COMMUNITY)
Admission: EM | Admit: 2024-03-24 | Discharge: 2024-03-24 | Disposition: A | Discharge status: Home / Self Care | Attending: Emergency Medicine | Admitting: Emergency Medicine

## 2024-03-24 ENCOUNTER — Other Ambulatory Visit: Payer: Self-pay

## 2024-03-24 DIAGNOSIS — Y990 Civilian activity done for income or pay: Secondary | ICD-10-CM | POA: Diagnosis not present

## 2024-03-24 DIAGNOSIS — X500XXA Overexertion from strenuous movement or load, initial encounter: Secondary | ICD-10-CM | POA: Diagnosis not present

## 2024-03-24 DIAGNOSIS — S39012A Strain of muscle, fascia and tendon of lower back, initial encounter: Secondary | ICD-10-CM | POA: Insufficient documentation

## 2024-03-24 DIAGNOSIS — S3992XA Unspecified injury of lower back, initial encounter: Secondary | ICD-10-CM | POA: Diagnosis present

## 2024-03-24 MED ORDER — METHOCARBAMOL 500 MG PO TABS: 500.0000 mg | ORAL_TABLET | Freq: Two times a day (BID) | ORAL | 0 refills | Status: DC

## 2024-03-24 MED ORDER — NAPROXEN 250 MG PO TABS
500.0000 mg | ORAL_TABLET | Freq: Once | ORAL | Status: AC
  Filled 2024-03-24: qty 2

## 2024-03-24 MED ORDER — LIDOCAINE 5 % EX PTCH: 1.0000 | MEDICATED_PATCH | CUTANEOUS | 0 refills | Status: DC

## 2024-03-24 MED ORDER — METHOCARBAMOL 500 MG PO TABS
500.0000 mg | ORAL_TABLET | Freq: Once | ORAL | Status: AC
  Filled 2024-03-24: qty 1

## 2024-03-24 MED ORDER — LIDOCAINE 5 % EX PTCH
1.0000 | MEDICATED_PATCH | CUTANEOUS | Status: DC
  Administered 2024-03-24: 1 via TRANSDERMAL
  Filled 2024-03-24: qty 1

## 2024-03-24 MED ORDER — NAPROXEN 500 MG PO TABS: 500.0000 mg | ORAL_TABLET | Freq: Two times a day (BID) | ORAL | 0 refills | Status: DC

## 2024-03-24 NOTE — ED Provider Notes (Signed)
 [facility] Provider Note   CSN: [id number] Arrival date & time: 03/24/24  1956     Patient presents with: Back Pain   [facility] Newman is a 43 y.o. male.   The history is provided by the patient and medical records.  Back Pain  43 year old male with no significant past medical history presenting to the ED with low back pain.  Patient reports he works for [employer] center and does a lot of pallet loading, forklift driving, sanitation, etc.  He works 12 to 14 hours a day, often 6 to 7 days a week.  States he did slip on some berries that had spilled on the floor a few weeks ago and felt like he tweaked his back.  Has had worsening pain since that time.  States he feels very sore and stiff.  He denies any radiation of pain into the legs.  No numbness or weakness.  No bowel or bladder incontinence.  Prior to Admission medications   Medication Sig Start Date End Date Taking? Authorizing Provider  ibuprofen  (ADVIL ) 800 MG tablet Take 1 tablet (800 mg total) by mouth 3 (three) times daily. 03/09/24   Rising, Ivette Marks, PA-C  tiZANidine  (ZANAFLEX ) 4 MG tablet Take 1 tablet (4 mg total) by mouth at bedtime as needed for muscle spasms. 03/09/24   Rising, Ivette Marks, PA-C    Allergies: Patient has no known allergies.    Review of Systems  Musculoskeletal:  Positive for back pain.  All other systems reviewed and are negative.   Updated Vital Signs BP 121/78 (BP Location: Left Arm)   Pulse 79   Temp 98.5 F (36.9 C)   Resp 16   Ht 5' 11 (1.803 m)   Wt 78.5 kg   SpO2 97%   BMI 24.14 kg/m   Physical Exam Vitals and nursing note reviewed.  Constitutional:      Appearance: He is well-developed.  HENT:     Head: Normocephalic and atraumatic.   Eyes:     Conjunctiva/sclera: Conjunctivae normal.     Pupils: Pupils are equal, round, and reactive to light.    Cardiovascular:     Rate and Rhythm: Normal rate and regular  rhythm.     Heart sounds: Normal heart sounds.  Pulmonary:     Effort: Pulmonary effort is normal.     Breath sounds: Normal breath sounds.  Abdominal:     General: Bowel sounds are normal.     Palpations: Abdomen is soft.   Musculoskeletal:        General: Normal range of motion.     Cervical back: Normal range of motion.     Comments: No midline lumbar deformity or step-off, does seem to have some muscular spasm bilaterally, mildly tender with deep palpation, is able to twist and maneuver on exam with only mild discomfort, able to stand and walk with normal gait, normal sensation throughout both legs   Skin:    General: Skin is warm and dry.   Neurological:     Mental Status: He is alert and oriented to person, place, and time.     (all labs ordered are listed, but only abnormal results are displayed) Labs Reviewed - No data to display  EKG: None  Radiology: No results found.   Procedures   Medications Ordered in the ED  methocarbamol  (ROBAXIN ) tablet 500 mg (has no administration in time range)  lidocaine  (LIDODERM ) 5 % 1 patch (has no administration  in time range)  naproxen  (NAPROSYN ) tablet 500 mg (has no administration in time range)                                    Medical Decision Making Risk Prescription drug management.   43 year old male here with low back pain.  Works at [employer], 12 to 14 hours a day and usually 6 to 7 days a week.  He is afebrile and nontoxic in appearance here.  Seems to have muscular soreness along the bilateral lumbar paraspinal musculature.  I do not appreciate any midline step-off or deformity.  No red flag symptoms or focal neurologic deficits.  Suspect likely lumbar strain/overuse.  Will treat symptomatically.  Encouraged heat therapy as well.  Can follow-up with PCP.  Return here for new concerns.  Final diagnoses:  Strain of lumbar region, initial encounter    ED Discharge Orders          Ordered     naproxen  (NAPROSYN ) 500 MG tablet  2 times daily        03/24/24 2228    lidocaine  (LIDODERM ) 5 %  Every 24 hours        03/24/24 2228    methocarbamol  (ROBAXIN ) 500 MG tablet  2 times daily        03/24/24 2228               Coretha Dew, PA-C 03/24/24 2248    Almond Army, MD 03/24/24 2311

## 2024-03-24 NOTE — ED Triage Notes (Signed)
 The pt has had lower back pain for 3 weeks and he does a lot of heavy lifting at his job

## 2024-03-24 NOTE — ED Notes (Signed)
 Discharge instructions reviewed.   Newly prescribed medications discussed. Pharmacy verified.   Opportunity for questions and concerns provided.   Alert, oriented and ambulatory. Displays no signs of distress.

## 2024-03-24 NOTE — Discharge Instructions (Signed)
 Take the prescribed medication as directed. Recommend to use heating pad, warm compress, hot shower, etc as well.  This can help to relax muscles along with medications. Follow-up with your primary care doctor. Return to the ED for new or worsening symptoms.

## 2024-07-01 ENCOUNTER — Ambulatory Visit (HOSPITAL_COMMUNITY)
Admission: EM | Admit: 2024-07-01 | Discharge: 2024-07-01 | Disposition: A | Discharge status: Home / Self Care | Attending: Emergency Medicine | Admitting: Emergency Medicine

## 2024-07-01 ENCOUNTER — Encounter (HOSPITAL_COMMUNITY): Payer: Self-pay

## 2024-07-01 DIAGNOSIS — M545 Low back pain, unspecified: Secondary | ICD-10-CM | POA: Diagnosis not present

## 2024-07-01 MED ORDER — PREDNISONE 20 MG PO TABS: 40.0000 mg | ORAL_TABLET | Freq: Every day | ORAL | 0 refills | Status: AC

## 2024-07-01 MED ORDER — BACLOFEN 10 MG PO TABS: 10.0000 mg | ORAL_TABLET | Freq: Three times a day (TID) | ORAL | 0 refills | Status: AC

## 2024-07-01 NOTE — ED Provider Notes (Signed)
 [facility]    CSN: [id number] Arrival date & time: 07/01/24  1751      History   Chief Complaint Chief Complaint  Patient presents with   Back Pain    HPI [facility] is a 43 y.o. male.   Patient presents with bilateral low back pain that began about 1 week ago.  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  Patient states that a few days ago he also had some back spasms which he reports he has never experienced before.  Patient denies any numbness, tingling, or pain that radiates down his legs.  Denies saddle anesthesia or bowel/bladder incontinence.  Patient denies any recent falls or known injuries.  Patient does have a history of reoccurring low back pain.  Patient denies taking any medication for symptoms.  Patient denies urinary symptoms.  The history is provided by the patient and medical records.  Back Pain   Past Medical History:  Diagnosis Date   Dental disease    Environmental allergies     Patient Active Problem List   Diagnosis Date Noted   Family history of early CAD 03/22/2023   Cigarette nicotine dependence without complication 03/22/2023    Past Surgical History:  Procedure Laterality Date   MOUTH SURGERY     OPEN REDUCTION INTERNAL FIXATION (ORIF) PROXIMAL PHALANX Left 12/29/2020   Procedure: Open treatment of left small finger proximal phalanx fracture;  Surgeon: Sebastian Lenis, MD;  Location: [facility];  Service: Orthopedics;  Laterality: Left;  with preop regional block       Home Medications    Prior to Admission medications   Medication Sig Start Date End Date Taking? Authorizing Provider  baclofen  (LIORESAL ) 10 MG tablet Take 1 tablet (10 mg total) by mouth 3 (three) times daily. 07/01/24  Yes Johnie Flaming A, NP  predniSONE  (DELTASONE ) 20 MG tablet Take 2 tablets (40 mg total) by mouth daily for 5 days. 07/01/24 07/06/24 Yes Johnie Flaming LABOR, NP     Family History Family History  Problem Relation Age of Onset   Heart failure Mother    Diabetes Mother    Cancer Mother    Healthy Father     Social History Social History   Tobacco Use   Smoking status: Some Days    Types: Cigars   Smokeless tobacco: Never  Vaping Use   Vaping status: Former   Substances: Nicotine, Flavoring  Substance Use Topics   Alcohol use: Yes   Drug use: Yes    Types: Marijuana     Allergies   Patient has no known allergies.   Review of Systems Review of Systems  Musculoskeletal:  Positive for back pain.   Per HPI  Physical Exam Triage Vital Signs ED Triage Vitals [07/01/24 1758]  Encounter Vitals Group     BP 122/84     Girls Systolic BP Percentile      Girls Diastolic BP Percentile      Boys Systolic BP Percentile      Boys Diastolic BP Percentile      Pulse Rate 88     Resp 14     Temp 98.3 F (36.8 C)     Temp Source Oral     SpO2 97 %     Weight      Height      Head Circumference      Peak Flow  Pain Score 6     Pain Loc      Pain Education      Exclude from Growth Chart    No data found.  Updated Vital Signs BP 122/84 (BP Location: Right Arm)   Pulse 88   Temp 98.3 F (36.8 C) (Oral)   Resp 14   SpO2 97%   Visual Acuity Right Eye Distance:   Left Eye Distance:   Bilateral Distance:    Right Eye Near:   Left Eye Near:    Bilateral Near:     Physical Exam Vitals and nursing note reviewed.  Constitutional:      General: He is awake. He is not in acute distress.    Appearance: Normal appearance. He is well-developed and well-groomed. He is not ill-appearing.  Musculoskeletal:     Cervical back: Normal.     Thoracic back: Normal.     Lumbar back: Tenderness present. No swelling, edema, deformity or bony tenderness. Normal range of motion. Negative right straight leg raise test and negative left straight leg raise test.       Back:     Comments: Tenderness noted to bilateral low back without  spinous process tenderness.  Skin:    General: Skin is warm and dry.  Neurological:     Mental Status: He is alert.  Psychiatric:        Behavior: Behavior is cooperative.      UC Treatments / Results  Labs (all labs ordered are listed, but only abnormal results are displayed) Labs Reviewed - No data to display  EKG   Radiology No results found.  Procedures Procedures (including critical care time)  Medications Ordered in UC Medications - No data to display  Initial Impression / Assessment and Plan / UC Course  I have reviewed the triage vital signs and the nursing notes.  Pertinent labs & imaging results that were available during my care of the patient were reviewed by me and considered in my medical decision making (see chart for details).     Patient is overall well-appearing.  Vitals are stable.  Back pain likely muscular in nature.  Given short prednisone  burst as well as baclofen  to help with muscle pain and spasm.  Recommended Tylenol  as needed for breakthrough pain.  Given orthopedic follow-up.  Discussed follow-up and return precautions. Final Clinical Impressions(s) / UC Diagnoses   Final diagnoses:  Acute right-sided low back pain without sciatica     Discharge Instructions      Start taking 2 tablets of prednisone  once daily for 5 days to help with inflammation causing your pain. You can also take baclofen  every 8 hours as needed for muscle pain and spasms. You can take 500 to 1000 mg of Tylenol  every 6-8 hours as needed for breakthrough pain. Alternate between ice and heat as needed for pain. Follow-up with [facility] if your pain continues for further evaluation.   ED Prescriptions     Medication Sig Dispense Auth. Provider   predniSONE  (DELTASONE ) 20 MG tablet Take 2 tablets (40 mg total) by mouth daily for 5 days. 10 tablet Johnie Flaming A, NP   baclofen  (LIORESAL ) 10 MG tablet Take 1 tablet (10 mg total) by mouth 3 (three) times daily. 30  each Johnie Flaming LABOR, NP      PDMP not reviewed this encounter.   Johnie Flaming A, NP 07/01/24 [phone]

## 2024-07-01 NOTE — ED Triage Notes (Signed)
 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  PatientPatient states he has not taken anything for his pain.

## 2024-07-01 NOTE — Discharge Instructions (Signed)
 Start taking 2 tablets of prednisone  once daily for 5 days to help with inflammation causing your pain. You can also take baclofen  every 8 hours as needed for muscle pain and spasms. You can take 500 to 1000 mg of Tylenol  every 6-8 hours as needed for breakthrough pain. Alternate between ice and heat as needed for pain. Follow-up with [facility] if your pain continues for further evaluation.

## 2024-07-16 ENCOUNTER — Ambulatory Visit (HOSPITAL_COMMUNITY)
Admission: EM | Admit: 2024-07-16 | Discharge: 2024-07-16 | Disposition: A | Discharge status: Home / Self Care | Attending: Emergency Medicine | Admitting: Emergency Medicine

## 2024-07-16 ENCOUNTER — Encounter (HOSPITAL_COMMUNITY): Payer: Self-pay

## 2024-07-16 DIAGNOSIS — K0889 Other specified disorders of teeth and supporting structures: Secondary | ICD-10-CM | POA: Diagnosis not present

## 2024-07-16 MED ORDER — IBUPROFEN 800 MG PO TABS: 800.0000 mg | ORAL_TABLET | Freq: Three times a day (TID) | ORAL | 0 refills | Status: DC

## 2024-07-16 MED ORDER — NYSTATIN 100000 UNIT/ML MT SUSP: 5.0000 mL | Freq: Four times a day (QID) | OROMUCOSAL | 0 refills | Status: AC | PRN

## 2024-07-16 MED ORDER — AMOXICILLIN-POT CLAVULANATE 875-125 MG PO TABS: 1.0000 | ORAL_TABLET | Freq: Two times a day (BID) | ORAL | 0 refills | Status: DC

## 2024-07-16 NOTE — Discharge Instructions (Addendum)
 Start taking Augmentin  twice daily for 7 days for dental infection coverage. You can take 800 mg ibuprofen  every 8 hours as needed for pain. You can alternate this with 500 to 1000 mg of Tylenol  every 6-8 hours.  Do not exceed 4000 mg in 1 day. You can also use Magic mouthwash up to 4 times daily as needed for additional pain relief. I have attached a list of low-cost dental resources to follow-up with for further evaluation.

## 2024-07-16 NOTE — ED Triage Notes (Signed)
 Patient presents to the office for dental pain x 2 weeks. Patient states his lower bottom teeth hurt.

## 2024-07-16 NOTE — ED Provider Notes (Signed)
 [facility]    CSN: [id number] Arrival date & time: 07/16/24  9077      History   Chief Complaint Chief Complaint  Patient presents with   Dental Problem    HPI Howard Newman is a 43 y.o. male.   Patient presents with left lower dental pain for about 2 weeks.  Patient has previous history of dental infections and recurrent dental issues, denies having a dentist at this time.  Denies fever, body aches, chills, weakness.  The history is provided by the patient and medical records.    Past Medical History:  Diagnosis Date   Dental disease    Environmental allergies     Patient Active Problem List   Diagnosis Date Noted   Family history of early CAD 03/22/2023   Cigarette nicotine dependence without complication 03/22/2023    Past Surgical History:  Procedure Laterality Date   MOUTH SURGERY     OPEN REDUCTION INTERNAL FIXATION (ORIF) PROXIMAL PHALANX Left 12/29/2020   Procedure: Open treatment of left small finger proximal phalanx fracture;  Surgeon: Sebastian Lenis, MD;  Location: [facility];  Service: Orthopedics;  Laterality: Left;  with preop regional block       Home Medications    Prior to Admission medications   Medication Sig Start Date End Date Taking? Authorizing Provider  amoxicillin -clavulanate (AUGMENTIN ) 875-125 MG tablet Take 1 tablet by mouth every 12 (twelve) hours. 07/16/24  Yes Johnie, Jonuel Butterfield A, NP  ibuprofen  (ADVIL ) 800 MG tablet Take 1 tablet (800 mg total) by mouth 3 (three) times daily. 07/16/24  Yes Johnie Flaming A, NP  magic mouthwash (nystatin, lidocaine , diphenhydrAMINE , alum & mag hydroxide) suspension Swish and spit 5 mLs 4 (four) times daily as needed for mouth pain. 07/16/24  Yes Johnie, Kassondra Geil A, NP  baclofen  (LIORESAL ) 10 MG tablet Take 1 tablet (10 mg total) by mouth 3 (three) times daily. 07/01/24   Johnie Flaming LABOR, NP    Family History Family History  Problem Relation Age of Onset   Heart  failure Mother    Diabetes Mother    Cancer Mother    Healthy Father     Social History Social History   Tobacco Use   Smoking status: Some Days    Types: Cigars   Smokeless tobacco: Never  Vaping Use   Vaping status: Former   Substances: Nicotine, Flavoring  Substance Use Topics   Alcohol use: Yes   Drug use: Yes    Types: Marijuana     Allergies   Patient has no known allergies.   Review of Systems Review of Systems  Per HPI  Physical Exam Triage Vital Signs ED Triage Vitals  Encounter Vitals Group     BP 07/16/24 0940 (!) 140/84     Girls Systolic BP Percentile --      Girls Diastolic BP Percentile --      Boys Systolic BP Percentile --      Boys Diastolic BP Percentile --      Pulse Rate 07/16/24 0940 87     Resp 07/16/24 0940 18     Temp 07/16/24 0940 98.3 F (36.8 C)     Temp Source 07/16/24 0940 Oral     SpO2 07/16/24 0940 96 %     Weight --      Height --      Head Circumference --      Peak Flow --      Pain Score 07/16/24 0941 5  Pain Loc --      Pain Education --      Exclude from Growth Chart --    No data found.  Updated Vital Signs BP (!) 140/84 (BP Location: Left Arm)   Pulse 87   Temp 98.3 F (36.8 C) (Oral)   Resp 18   SpO2 96%   Visual Acuity Right Eye Distance:   Left Eye Distance:   Bilateral Distance:    Right Eye Near:   Left Eye Near:    Bilateral Near:     Physical Exam Vitals and nursing note reviewed.  Constitutional:      General: He is awake. He is not in acute distress.    Appearance: Normal appearance. He is well-developed and well-groomed. He is not ill-appearing.  HENT:     Mouth/Throat:     Dentition: Abnormal dentition. Dental tenderness and gingival swelling present.      Comments: Dental tenderness and gingival swelling noted to and around second premolar. Skin:    General: Skin is warm and dry.  Neurological:     Mental Status: He is alert.  Psychiatric:        Behavior: Behavior is  cooperative.      UC Treatments / Results  Labs (all labs ordered are listed, but only abnormal results are displayed) Labs Reviewed - No data to display  EKG   Radiology No results found.  Procedures Procedures (including critical care time)  Medications Ordered in UC Medications - No data to display  Initial Impression / Assessment and Plan / UC Course  I have reviewed the triage vital signs and the nursing notes.  Pertinent labs & imaging results that were available during my care of the patient were reviewed by me and considered in my medical decision making (see chart for details).     Patient is overall well-appearing.  Vitals are stable.  Prescribed Augmentin  for dental infection coverage.  Prescribed ibuprofen  as needed for pain.  Prescribed Magic mouthwash for additional pain relief.  Provided patient with dental resources.  Discussed follow-up and return precautions. Final Clinical Impressions(s) / UC Diagnoses   Final diagnoses:  Pain, dental     Discharge Instructions      Start taking Augmentin  twice daily for 7 days for dental infection coverage. You can take 800 mg ibuprofen  every 8 hours as needed for pain. You can alternate this with 500 to 1000 mg of Tylenol  every 6-8 hours.  Do not exceed 4000 mg in 1 day. You can also use Magic mouthwash up to 4 times daily as needed for additional pain relief. I have attached a list of low-cost dental resources to follow-up with for further evaluation.   ED Prescriptions     Medication Sig Dispense Auth. Provider   ibuprofen  (ADVIL ) 800 MG tablet Take 1 tablet (800 mg total) by mouth 3 (three) times daily. 21 tablet Johnie Flaming A, NP   amoxicillin -clavulanate (AUGMENTIN ) 875-125 MG tablet Take 1 tablet by mouth every 12 (twelve) hours. 14 tablet Johnie Flaming A, NP   magic mouthwash (nystatin, lidocaine , diphenhydrAMINE , alum & mag hydroxide) suspension Swish and spit 5 mLs 4 (four) times daily as  needed for mouth pain. 180 mL Johnie Flaming A, NP      PDMP not reviewed this encounter.   Johnie Flaming A, NP 07/16/24 1004

## 2024-08-11 ENCOUNTER — Encounter (HOSPITAL_COMMUNITY): Payer: Self-pay | Admitting: *Deleted

## 2024-08-11 ENCOUNTER — Ambulatory Visit (HOSPITAL_COMMUNITY)
Admission: EM | Admit: 2024-08-11 | Discharge: 2024-08-11 | Disposition: A | Discharge status: Home / Self Care | Attending: Physician Assistant | Admitting: Physician Assistant

## 2024-08-11 DIAGNOSIS — B353 Tinea pedis: Secondary | ICD-10-CM

## 2024-08-11 MED ORDER — MICONAZOLE NITRATE 2 % EX AERP: 1.0000 | INHALATION_SPRAY | Freq: Two times a day (BID) | CUTANEOUS | 2 refills | Status: AC

## 2024-08-11 NOTE — Discharge Instructions (Signed)
 Can apply powder two times per day Wear cotton socks Dry feet well before applying powder Follow up with podiatry.

## 2024-08-11 NOTE — ED Triage Notes (Signed)
 C/O left lateral foot pruritus and athlete's foot onset weeks ago. Has been applying OTC athlete's foot cream. Also noticed a lump to bottom of right foot approx 1 wk ago.  Got a foot spa treatment couple days ago.

## 2024-08-11 NOTE — ED Provider Notes (Signed)
 [facility]    CSN: [id number] Arrival date & time: 08/11/24  1730      History   Chief Complaint Chief Complaint  Patient presents with   Foot Problem    HPI Howard Newman is a 43 y.o. male.   Patient reports bilateral foot itching and dry skin that started several weeks ago.  He has tried over-the-counter athlete's foot cream with minimal relief.  He also complains of a small painful lump to the bottom of his right foot that started about a week ago.  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    Past Medical History:  Diagnosis Date   Dental disease    Environmental allergies     Patient Active Problem List   Diagnosis Date Noted   Family history of early CAD 03/22/2023   Cigarette nicotine dependence without complication 03/22/2023    Past Surgical History:  Procedure Laterality Date   MOUTH SURGERY     OPEN REDUCTION INTERNAL FIXATION (ORIF) PROXIMAL PHALANX Left 12/29/2020   Procedure: Open treatment of left small finger proximal phalanx fracture;  Surgeon: Sebastian Lenis, MD;  Location: [facility];  Service: Orthopedics;  Laterality: Left;  with preop regional block       Home Medications    Prior to Admission medications   Medication Sig Start Date End Date Taking? Authorizing Provider  Miconazole  Nitrate 2 % AERP Apply 1 Application topically 2 (two) times daily. 08/11/24  Yes Ward, Kenechukwu Eckstein Z, PA-C  amoxicillin -clavulanate (AUGMENTIN ) 875-125 MG tablet Take 1 tablet by mouth every 12 (twelve) hours. 07/16/24   Johnie Flaming A, NP  baclofen  (LIORESAL ) 10 MG tablet Take 1 tablet (10 mg total) by mouth 3 (three) times daily. 07/01/24   Johnie Flaming A, NP  ibuprofen  (ADVIL ) 800 MG tablet Take 1 tablet (800 mg total) by mouth 3 (three) times daily. 07/16/24   Johnie Flaming A, NP  magic mouthwash (nystatin, lidocaine , diphenhydrAMINE , alum & mag hydroxide) suspension Swish and spit 5 mLs 4 (four) times  daily as needed for mouth pain. 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   Review of Systems Review of Systems  Constitutional:  Negative for chills and fever.  HENT:  Negative for ear pain and sore throat.   Eyes:  Negative for pain and visual disturbance.  Respiratory:  Negative for cough and shortness of breath.   Cardiovascular:  Negative for chest pain and palpitations.  Gastrointestinal:  Negative for abdominal pain and vomiting.  Genitourinary:  Negative for dysuria and hematuria.  Musculoskeletal:  Positive for arthralgias. Negative for back pain.  Skin:  Positive for rash. Negative for color change.  Neurological:  Negative for seizures and syncope.  All other systems reviewed  and are negative.    Physical Exam Triage Vital Signs ED Triage Vitals  Encounter Vitals Group     BP 08/11/24 1810 120/79     Girls Systolic BP Percentile --      Girls Diastolic BP Percentile --      Boys Systolic BP Percentile --      Boys Diastolic BP Percentile --      Pulse Rate 08/11/24 1810 78     Resp 08/11/24 1810 16     Temp 08/11/24 1810 97.9 F (36.6 C)     Temp Source 08/11/24 1810 Oral     SpO2 08/11/24 1810 94 %     Weight --      Height --      Head Circumference --      Peak Flow --      Pain Score 08/11/24 1811 0     Pain Loc --      Pain Education --      Exclude from Growth Chart --    No data found.  Updated Vital Signs BP  120/79   Pulse 78   Temp 97.9 F (36.6 C) (Oral)   Resp 16   SpO2 94%   Visual Acuity Right Eye Distance:   Left Eye Distance:   Bilateral Distance:    Right Eye Near:   Left Eye Near:    Bilateral Near:     Physical Exam Vitals and nursing note reviewed.  Constitutional:      General: He is not in acute distress.    Appearance: He is well-developed.  HENT:     Head: Normocephalic and atraumatic.  Eyes:     Conjunctiva/sclera: Conjunctivae normal.  Cardiovascular:     Rate and Rhythm: Normal rate and regular rhythm.     Heart sounds: No murmur heard. Pulmonary:     Effort: Pulmonary effort is normal. No respiratory distress.     Breath sounds: Normal breath sounds.  Abdominal:     Palpations: Abdomen is soft.     Tenderness: There is no abdominal tenderness.  Musculoskeletal:        General: No swelling.     Cervical back: Neck supple.  Feet:     Comments: Dry rash noted to top of left foot and between the toes on the right foot.  Small nodule noted to the bottom of the foot that is mildly tender, mobile, soft. Skin:    General: Skin is warm and dry.     Capillary Refill: Capillary refill takes less than 2 seconds.  Neurological:     Mental Status: He is alert.  Psychiatric:        Mood and Affect: Mood normal.      UC Treatments / Results  Labs (all labs ordered are listed, but only abnormal results are displayed) Labs Reviewed - No data to display  EKG   Radiology No results found.  Procedures Procedures (including critical care time)  Medications Ordered in UC Medications - No data to display  Initial Impression / Assessment and Plan / UC Course  I have reviewed the triage vital signs and the nursing notes.  Pertinent labs & imaging results that were available during my care of the patient were reviewed by me and considered in my medical decision making (see chart for details).     Miconazole  powder sent in for possible athlete's foot.   Advise follow-up with podiatry for nodule noted to the bottom of his foot, possible Morton's  neuroma.  Supportive care discussed.  Return precautions discussed. Final Clinical Impressions(s) / UC Diagnoses   Final diagnoses:  Tinea pedis of both feet     Discharge Instructions      Can apply powder two times per day Wear cotton socks Dry feet well before applying powder Follow up with podiatry.    ED Prescriptions     Medication Sig Dispense Auth. Provider   Miconazole  Nitrate 2 % AERP Apply 1 Application topically 2 (two) times daily. 85 g Ward, Cythina Mickelsen Z, PA-C      PDMP not reviewed this encounter.   Ward, Harlene PEDLAR, PA-C 08/11/24 1840

## 2024-10-19 ENCOUNTER — Encounter (HOSPITAL_COMMUNITY): Payer: Self-pay

## 2024-10-19 ENCOUNTER — Ambulatory Visit (HOSPITAL_COMMUNITY): Admission: EM | Admit: 2024-10-19 | Discharge: 2024-10-19 | Disposition: A | Discharge status: Home / Self Care

## 2024-10-19 DIAGNOSIS — M722 Plantar fascial fibromatosis: Secondary | ICD-10-CM

## 2024-10-19 DIAGNOSIS — K047 Periapical abscess without sinus: Secondary | ICD-10-CM | POA: Diagnosis not present

## 2024-10-19 MED ORDER — AMOXICILLIN-POT CLAVULANATE 875-125 MG PO TABS: 1.0000 | ORAL_TABLET | Freq: Two times a day (BID) | ORAL | 0 refills | Status: AC

## 2024-10-19 MED ORDER — DICLOFENAC SODIUM 50 MG PO TBEC: 50.0000 mg | DELAYED_RELEASE_TABLET | Freq: Two times a day (BID) | ORAL | 1 refills | Status: AC

## 2024-10-19 NOTE — ED Triage Notes (Signed)
 Patient presents for evaluation of (R) foot pain, he was seen in November but misplaced paperwork. I informed patient that he is to follow up with podiatry. Patient has not been taking any medications for foot pain and has not tried any remedies. Able to bear some weight.   Patient reports (L) lower side dental pain for several weeks. + swelling and pain. No medications for symptoms. Patient thinks he has an infected tooth.

## 2024-10-19 NOTE — ED Provider Notes (Signed)
 " [facility]  Note:  This document was prepared using Dragon voice recognition software and may include unintentional dictation errors.  MRN: [id number] DOB: September 29, 1981  Subjective:   Howard Newman is a 44 y.o. male presenting for reevaluation of right foot pain from visit in November and left lower dental pain x 2 to 3 weeks.  Patient reports no new injury to the right foot, patient believes he was referred to podiatry but does not have paperwork from visit to make follow-up appointment.  Patient has not yet tried any over-the-counter medication to treat symptoms prior to arrival in urgent care.  Patient is able to ambulate and bear weight on foot with mild difficulty.  Patient states that he has had left lower dental pain for several weeks.  Reports swelling and pain due to what he believes is an infected tooth.  Patient has not tried any over-the-counter medication to treat symptoms.  States that he will be following up with his dentist to have tooth evaluated and removed if necessary.  Patient has already had several upper teeth removed due to underlying infection.  Current Medications[1]   Allergies[2]  Past Medical History:  Diagnosis Date   Dental disease    Environmental allergies      Past Surgical History:  Procedure Laterality Date   MOUTH SURGERY     OPEN REDUCTION INTERNAL FIXATION (ORIF) PROXIMAL PHALANX Left 12/29/2020   Procedure: Open treatment of left small finger proximal phalanx fracture;  Surgeon: Sebastian Lenis, MD;  Location: [facility];  Service: Orthopedics;  Laterality: Left;  with preop regional block    Family History  Problem Relation Age of Onset   Heart failure Mother    Diabetes Mother    Cancer Mother    Healthy Father     Social History[3]  ROS Refer to HPI for ROS details.  Objective:    Vitals: BP (!) 142/79 (BP Location: Left Arm)   Pulse 78   Temp 97.9 F (36.6 C) (Oral)   Resp 16   SpO2 97%    Physical Exam Vitals and nursing note reviewed.  Constitutional:      General: He is not in acute distress.    Appearance: Normal appearance. He is well-developed. He is not ill-appearing or toxic-appearing.  HENT:     Head: Normocephalic.     Nose: Nose normal.     Mouth/Throat:     Lips: Pink.     Mouth: Mucous membranes are moist.     Dentition: Abnormal dentition. Dental tenderness, dental caries and dental abscesses present.     Pharynx: Oropharynx is clear.  Cardiovascular:     Rate and Rhythm: Normal rate.  Pulmonary:     Effort: Pulmonary effort is normal. No respiratory distress.     Breath sounds: No stridor. No wheezing.  Musculoskeletal:     Right foot: Normal range of motion and normal capillary refill. Swelling and tenderness present. No deformity, bony tenderness or crepitus. Normal pulse.  Skin:    General: Skin is warm and dry.  Neurological:     General: No focal deficit present.     Mental Status: He is alert and oriented to person, place, and time.  Psychiatric:        Mood and Affect: Mood normal.        Behavior: Behavior normal.     Procedures  No results found for this or any previous visit (from the past 24 hours).  Assessment and Plan :  Discharge Instructions       1. Dental infection (Primary) - amoxicillin -clavulanate (AUGMENTIN ) 875-125 MG tablet; Take 1 tablet by mouth every 12 (twelve) hours.  Dispense: 14 tablet; Refill: 0  2. Plantar fasciitis of right foot - diclofenac  (VOLTAREN ) 50 MG EC tablet; Take 1 tablet (50 mg total) by mouth 2 (two) times daily.  Dispense: 30 tablet; Refill: 1 - Ambulatory referral to Podiatry for further evaluation and management of right foot pain possibly secondary to plantar fasciitis.  -Continue to monitor symptoms for any change in severity if there is any escalation of current symptoms or development of new symptoms follow-up in ER for further evaluation and management.      Aarti Mankowski B  Taje Littler    [1] No current facility-administered medications for this encounter.  Current Outpatient Medications:    diclofenac  (VOLTAREN ) 50 MG EC tablet, Take 1 tablet (50 mg total) by mouth 2 (two) times daily., Disp: 30 tablet, Rfl: 1   amoxicillin -clavulanate (AUGMENTIN ) 875-125 MG tablet, Take 1 tablet by mouth every 12 (twelve) hours., Disp: 14 tablet, Rfl: 0   baclofen  (LIORESAL ) 10 MG tablet, Take 1 tablet (10 mg total) by mouth 3 (three) times daily., Disp: 30 each, Rfl: 0   ibuprofen  (ADVIL ) 800 MG tablet, Take 1 tablet (800 mg total) by mouth 3 (three) times daily., Disp: 21 tablet, Rfl: 0   magic mouthwash (nystatin , lidocaine , diphenhydrAMINE , alum & mag hydroxide) suspension, Swish and spit 5 mLs 4 (four) times daily as needed for mouth pain., Disp: 180 mL, Rfl: 0   Miconazole  Nitrate 2 % AERP, Apply 1 Application topically 2 (two) times daily., Disp: 85 g, Rfl: 2 [2] No Known Allergies [3]  Social History Tobacco Use   Smoking status: Some Days    Types: Cigars   Smokeless tobacco: Never   Tobacco comments:    Former Copy   Vaping status: Never Used  Substance Use Topics   Alcohol use: Yes    Comment: every other night   Drug use: Yes    Types: Marijuana    Comment: occasionally     Candra Wegner B, NP 10/19/24 1030  "

## 2024-10-19 NOTE — Discharge Instructions (Signed)
" °  1. Dental infection (Primary) - amoxicillin -clavulanate (AUGMENTIN ) 875-125 MG tablet; Take 1 tablet by mouth every 12 (twelve) hours.  Dispense: 14 tablet; Refill: 0  2. Plantar fasciitis of right foot - diclofenac  (VOLTAREN ) 50 MG EC tablet; Take 1 tablet (50 mg total) by mouth 2 (two) times daily.  Dispense: 30 tablet; Refill: 1 - Ambulatory referral to Podiatry for further evaluation and management of right foot pain possibly secondary to plantar fasciitis.  -Continue to monitor symptoms for any change in severity if there is any escalation of current symptoms or development of new symptoms follow-up in ER for further evaluation and management. "

## 2024-10-22 DIAGNOSIS — S39012A Strain of muscle, fascia and tendon of lower back, initial encounter: Secondary | ICD-10-CM | POA: Insufficient documentation

## 2024-10-22 DIAGNOSIS — X500XXA Overexertion from strenuous movement or load, initial encounter: Secondary | ICD-10-CM | POA: Insufficient documentation

## 2024-10-22 DIAGNOSIS — S3992XA Unspecified injury of lower back, initial encounter: Secondary | ICD-10-CM | POA: Diagnosis present

## 2024-10-23 ENCOUNTER — Other Ambulatory Visit: Payer: Self-pay

## 2024-10-23 ENCOUNTER — Emergency Department (HOSPITAL_COMMUNITY)
Admission: EM | Admit: 2024-10-23 | Discharge: 2024-10-23 | Disposition: A | Attending: Emergency Medicine | Admitting: Emergency Medicine

## 2024-10-23 ENCOUNTER — Encounter (HOSPITAL_COMMUNITY): Payer: Self-pay

## 2024-10-23 DIAGNOSIS — S39012A Strain of muscle, fascia and tendon of lower back, initial encounter: Secondary | ICD-10-CM

## 2024-10-23 MED ORDER — METHOCARBAMOL 500 MG PO TABS: 500.0000 mg | ORAL_TABLET | Freq: Three times a day (TID) | ORAL | 0 refills | Status: AC | PRN

## 2024-10-23 MED ORDER — IBUPROFEN 800 MG PO TABS: 800.0000 mg | ORAL_TABLET | Freq: Three times a day (TID) | ORAL | 0 refills | Status: AC | PRN

## 2024-10-23 NOTE — ED Triage Notes (Signed)
 Pt arrived from home via POV c/o back pain 10/10 Right lower back. 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

## 2024-10-23 NOTE — ED Notes (Signed)
Called X 1 no answer 

## 2024-10-23 NOTE — ED Notes (Signed)
 Called X 1 no answer,

## 2024-10-23 NOTE — ED Provider Notes (Signed)
 " [facility] Provider Note   CSN: [id number] Arrival date & time: 10/22/24  2344     Patient presents with: Back Pain   Howard Newman is a 44 y.o. male.  He is complaining of some right sided low back pain after lifting a heavy object.  Radiates to his buttocks but not further down.  No bowel or bladder incontinence no numbness or weakness.  Tried some Tylenol  and ibuprofen  without significant improvement.  No other complaints.   The history is provided by the patient.  Back Pain Location:  Gluteal region Quality:  Aching Pain severity:  Moderate Onset quality:  Gradual Timing:  Constant Progression:  Unchanged Chronicity:  New Context: lifting heavy objects   Relieved by:  Nothing Worsened by:  Bending and ambulation Ineffective treatments:  NSAIDs Associated symptoms: no abdominal pain, no bladder incontinence, no bowel incontinence, no fever, no leg pain, no numbness and no weakness        Prior to Admission medications  Medication Sig Start Date End Date Taking? Authorizing Provider  amoxicillin -clavulanate (AUGMENTIN ) 875-125 MG tablet Take 1 tablet by mouth every 12 (twelve) hours. 10/19/24   Reddick, Johnathan B, NP  baclofen  (LIORESAL ) 10 MG tablet Take 1 tablet (10 mg total) by mouth 3 (three) times daily. 07/01/24   Johnie Flaming A, NP  diclofenac  (VOLTAREN ) 50 MG EC tablet Take 1 tablet (50 mg total) by mouth 2 (two) times daily. 10/19/24   Reddick, Johnathan B, NP  ibuprofen  (ADVIL ) 800 MG tablet Take 1 tablet (800 mg total) by mouth 3 (three) times daily. 07/16/24   Johnie Flaming LABOR, NP  magic mouthwash (nystatin , lidocaine , diphenhydrAMINE , alum & mag hydroxide) suspension Swish and spit 5 mLs 4 (four) times daily as needed for mouth pain. 07/16/24   Johnie Flaming A, NP  Miconazole  Nitrate 2 % AERP Apply 1 Application topically 2 (two) times daily. 08/11/24   Ward, Harlene PEDLAR, PA-C    Allergies: Patient has no  known allergies.    Review of Systems  Constitutional:  Negative for fever.  Gastrointestinal:  Negative for abdominal pain and bowel incontinence.  Genitourinary:  Negative for bladder incontinence.  Musculoskeletal:  Positive for back pain.  Neurological:  Negative for weakness and numbness.    Updated Vital Signs BP 107/65   Pulse 77   Temp (!) 97.5 F (36.4 C) (Oral)   Resp 16   Ht 5' 11 (1.803 m)   Wt 82.6 kg   SpO2 98%   BMI 25.38 kg/m   Physical Exam Vitals and nursing note reviewed.  Constitutional:      Appearance: Normal appearance. He is well-developed.  HENT:     Head: Normocephalic and atraumatic.  Eyes:     Conjunctiva/sclera: Conjunctivae normal.  Pulmonary:     Effort: Pulmonary effort is normal.  Musculoskeletal:        General: Tenderness present.     Cervical back: Neck supple.     Comments: He has no midline spine tenderness.  He has some right paralumbar into right gluteal tenderness  Skin:    General: Skin is warm and dry.  Neurological:     General: No focal deficit present.     Mental Status: He is alert.     GCS: GCS eye subscore is 4. GCS verbal subscore is 5. GCS motor subscore is 6.     Sensory: No sensory deficit.     Motor: No weakness.  Gait: Gait normal.     (all labs ordered are listed, but only abnormal results are displayed) Labs Reviewed - No data to display  EKG: None  Radiology: No results found.   Procedures   Medications Ordered in the ED - No data to display                                  Medical Decision Making Risk Prescription drug management.   This patient complains of right-sided low back pain; this involves an extensive number of treatment Options and is a complaint that carries with it a high risk of complications and morbidity. The differential includes musculoskeletal pain, radiculopathy, fracture Previous records obtained and reviewed in epic including prior ED visits Social  determinants considered, tobacco use Critical Interventions: None  After the interventions stated above, I reevaluated the patient and found patient to be well-appearing in no distress with benign physical exam Admission and further testing considered, no indications for admission or further workup.  Will treat symptomatically.  Return instructions discussed.      Final diagnoses:  Strain of lumbar region, initial encounter    ED Discharge Orders          Ordered    ibuprofen  (ADVIL ) 800 MG tablet  Every 8 hours PRN        10/23/24 1236    methocarbamol  (ROBAXIN ) 500 MG tablet  Every 8 hours PRN        10/23/24 1236               Towana Ozell BROCKS, MD 10/23/24 1715  "

## 2024-10-25 ENCOUNTER — Ambulatory Visit: Admitting: Podiatry
